# Patient Record
Sex: Female | Born: 1935 | Race: White | Hispanic: No | Marital: Married | Smoking: Former smoker
Health system: Southern US, Community
[De-identification: ages and names within clinical notes are randomized; demographics above are authoritative.]

## PROBLEM LIST (undated history)

## (undated) DIAGNOSIS — E871 Hypo-osmolality and hyponatremia: Secondary | ICD-10-CM

## (undated) DIAGNOSIS — R339 Retention of urine, unspecified: Secondary | ICD-10-CM

## (undated) DIAGNOSIS — D72829 Elevated white blood cell count, unspecified: Secondary | ICD-10-CM

## (undated) DIAGNOSIS — E222 Syndrome of inappropriate secretion of antidiuretic hormone: Secondary | ICD-10-CM

## (undated) DIAGNOSIS — C50919 Malignant neoplasm of unspecified site of unspecified female breast: Secondary | ICD-10-CM

## (undated) DIAGNOSIS — I10 Essential (primary) hypertension: Secondary | ICD-10-CM

## (undated) DIAGNOSIS — M1990 Unspecified osteoarthritis, unspecified site: Secondary | ICD-10-CM

## (undated) DIAGNOSIS — R5381 Other malaise: Secondary | ICD-10-CM

## (undated) DIAGNOSIS — Z9181 History of falling: Secondary | ICD-10-CM

## (undated) DIAGNOSIS — R4189 Other symptoms and signs involving cognitive functions and awareness: Secondary | ICD-10-CM

## (undated) DIAGNOSIS — I214 Non-ST elevation (NSTEMI) myocardial infarction: Secondary | ICD-10-CM

## (undated) DIAGNOSIS — Z86010 Personal history of colonic polyps: Secondary | ICD-10-CM

## (undated) DIAGNOSIS — Z9289 Personal history of other medical treatment: Secondary | ICD-10-CM

## (undated) DIAGNOSIS — E7800 Pure hypercholesterolemia, unspecified: Secondary | ICD-10-CM

## (undated) DIAGNOSIS — I5032 Chronic diastolic (congestive) heart failure: Secondary | ICD-10-CM

## (undated) HISTORY — DX: History of falling: Z91.81

## (undated) HISTORY — PX: BREAST LUMPECTOMY: SHX2

## (undated) HISTORY — DX: Non-ST elevation (NSTEMI) myocardial infarction: I21.4

## (undated) HISTORY — DX: Other symptoms and signs involving cognitive functions and awareness: R41.89

## (undated) HISTORY — DX: Unspecified osteoarthritis, unspecified site: M19.90

## (undated) HISTORY — DX: Chronic diastolic (congestive) heart failure: I50.32

## (undated) HISTORY — PX: COLONOSCOPY: SHX174

## (undated) HISTORY — DX: Personal history of other medical treatment: Z92.89

## (undated) HISTORY — DX: Elevated white blood cell count, unspecified: D72.829

## (undated) HISTORY — DX: Personal history of colonic polyps: Z86.010

## (undated) HISTORY — DX: Hypo-osmolality and hyponatremia: E87.1

## (undated) HISTORY — DX: Retention of urine, unspecified: R33.9

## (undated) HISTORY — DX: Syndrome of inappropriate secretion of antidiuretic hormone: E22.2

## (undated) HISTORY — DX: Other malaise: R53.81

## (undated) HISTORY — DX: Malignant neoplasm of unspecified site of unspecified female breast: C50.919

---

## 1998-03-03 ENCOUNTER — Encounter: Admission: RE | Admit: 1998-03-03 | Discharge: 1998-06-01 | Payer: Self-pay | Admitting: Radiation Oncology

## 1998-05-24 ENCOUNTER — Other Ambulatory Visit: Admission: RE | Admit: 1998-05-24 | Discharge: 1998-05-24 | Payer: Self-pay | Admitting: Obstetrics and Gynecology

## 1999-06-07 ENCOUNTER — Other Ambulatory Visit: Admission: RE | Admit: 1999-06-07 | Discharge: 1999-06-07 | Payer: Self-pay | Admitting: Obstetrics and Gynecology

## 2000-07-16 ENCOUNTER — Other Ambulatory Visit: Admission: RE | Admit: 2000-07-16 | Discharge: 2000-07-16 | Payer: Self-pay | Admitting: Obstetrics and Gynecology

## 2001-08-25 ENCOUNTER — Other Ambulatory Visit: Admission: RE | Admit: 2001-08-25 | Discharge: 2001-08-25 | Payer: Self-pay | Admitting: Obstetrics and Gynecology

## 2002-10-01 ENCOUNTER — Other Ambulatory Visit: Admission: RE | Admit: 2002-10-01 | Discharge: 2002-10-01 | Payer: Self-pay | Admitting: Obstetrics and Gynecology

## 2003-10-06 ENCOUNTER — Emergency Department (HOSPITAL_COMMUNITY): Admission: EM | Admit: 2003-10-06 | Discharge: 2003-10-06 | Payer: Self-pay | Admitting: Emergency Medicine

## 2003-10-06 ENCOUNTER — Other Ambulatory Visit: Admission: RE | Admit: 2003-10-06 | Discharge: 2003-10-06 | Payer: Self-pay | Admitting: Obstetrics and Gynecology

## 2004-12-25 ENCOUNTER — Other Ambulatory Visit: Admission: RE | Admit: 2004-12-25 | Discharge: 2004-12-25 | Payer: Self-pay | Admitting: Family Medicine

## 2004-12-29 ENCOUNTER — Encounter: Admission: RE | Admit: 2004-12-29 | Discharge: 2004-12-29 | Payer: Self-pay | Admitting: Family Medicine

## 2006-10-29 ENCOUNTER — Encounter: Admission: RE | Admit: 2006-10-29 | Discharge: 2006-10-29 | Payer: Self-pay | Admitting: Family Medicine

## 2006-11-18 ENCOUNTER — Ambulatory Visit: Payer: Self-pay | Admitting: Internal Medicine

## 2006-12-04 ENCOUNTER — Ambulatory Visit: Payer: Self-pay | Admitting: Internal Medicine

## 2007-01-31 ENCOUNTER — Other Ambulatory Visit: Admission: RE | Admit: 2007-01-31 | Discharge: 2007-01-31 | Payer: Self-pay | Admitting: Family Medicine

## 2007-02-03 ENCOUNTER — Encounter: Admission: RE | Admit: 2007-02-03 | Discharge: 2007-02-03 | Payer: Self-pay | Admitting: Family Medicine

## 2009-12-03 HISTORY — PX: CATARACT EXTRACTION W/ INTRAOCULAR LENS  IMPLANT, BILATERAL: SHX1307

## 2011-06-23 ENCOUNTER — Encounter: Payer: Self-pay | Admitting: *Deleted

## 2011-06-23 ENCOUNTER — Emergency Department (HOSPITAL_BASED_OUTPATIENT_CLINIC_OR_DEPARTMENT_OTHER)
Admission: EM | Admit: 2011-06-23 | Discharge: 2011-06-23 | Disposition: A | Discharge status: Nursing Home (Includes ICF) | Payer: Medicare Other | Source: Home / Self Care | Attending: Emergency Medicine | Admitting: Emergency Medicine

## 2011-06-23 ENCOUNTER — Emergency Department (INDEPENDENT_AMBULATORY_CARE_PROVIDER_SITE_OTHER): Payer: Medicare Other

## 2011-06-23 ENCOUNTER — Other Ambulatory Visit: Payer: Self-pay

## 2011-06-23 ENCOUNTER — Inpatient Hospital Stay (HOSPITAL_COMMUNITY)
Admission: AD | Admit: 2011-06-23 | Discharge: 2011-06-27 | DRG: 643 | Disposition: A | Discharge status: Home / Self Care | Payer: Medicare Other | Source: Other Acute Inpatient Hospital | Attending: Internal Medicine | Admitting: Internal Medicine

## 2011-06-23 DIAGNOSIS — R9431 Abnormal electrocardiogram [ECG] [EKG]: Secondary | ICD-10-CM

## 2011-06-23 DIAGNOSIS — I498 Other specified cardiac arrhythmias: Secondary | ICD-10-CM | POA: Diagnosis present

## 2011-06-23 DIAGNOSIS — Z87891 Personal history of nicotine dependence: Secondary | ICD-10-CM

## 2011-06-23 DIAGNOSIS — E236 Other disorders of pituitary gland: Principal | ICD-10-CM | POA: Diagnosis present

## 2011-06-23 DIAGNOSIS — I214 Non-ST elevation (NSTEMI) myocardial infarction: Secondary | ICD-10-CM | POA: Diagnosis present

## 2011-06-23 DIAGNOSIS — Z923 Personal history of irradiation: Secondary | ICD-10-CM

## 2011-06-23 DIAGNOSIS — R5381 Other malaise: Secondary | ICD-10-CM

## 2011-06-23 DIAGNOSIS — G319 Degenerative disease of nervous system, unspecified: Secondary | ICD-10-CM

## 2011-06-23 DIAGNOSIS — I679 Cerebrovascular disease, unspecified: Secondary | ICD-10-CM

## 2011-06-23 DIAGNOSIS — Z79899 Other long term (current) drug therapy: Secondary | ICD-10-CM

## 2011-06-23 DIAGNOSIS — R059 Cough, unspecified: Secondary | ICD-10-CM

## 2011-06-23 DIAGNOSIS — E785 Hyperlipidemia, unspecified: Secondary | ICD-10-CM | POA: Diagnosis present

## 2011-06-23 DIAGNOSIS — E871 Hypo-osmolality and hyponatremia: Secondary | ICD-10-CM

## 2011-06-23 DIAGNOSIS — I2489 Other forms of acute ischemic heart disease: Secondary | ICD-10-CM | POA: Diagnosis present

## 2011-06-23 DIAGNOSIS — R5383 Other fatigue: Secondary | ICD-10-CM

## 2011-06-23 DIAGNOSIS — I10 Essential (primary) hypertension: Secondary | ICD-10-CM | POA: Diagnosis present

## 2011-06-23 DIAGNOSIS — I248 Other forms of acute ischemic heart disease: Secondary | ICD-10-CM | POA: Diagnosis present

## 2011-06-23 DIAGNOSIS — N390 Urinary tract infection, site not specified: Secondary | ICD-10-CM | POA: Diagnosis present

## 2011-06-23 DIAGNOSIS — Z8249 Family history of ischemic heart disease and other diseases of the circulatory system: Secondary | ICD-10-CM

## 2011-06-23 DIAGNOSIS — R05 Cough: Secondary | ICD-10-CM

## 2011-06-23 DIAGNOSIS — E876 Hypokalemia: Secondary | ICD-10-CM | POA: Diagnosis present

## 2011-06-23 DIAGNOSIS — R112 Nausea with vomiting, unspecified: Secondary | ICD-10-CM

## 2011-06-23 DIAGNOSIS — Z823 Family history of stroke: Secondary | ICD-10-CM

## 2011-06-23 DIAGNOSIS — Z7982 Long term (current) use of aspirin: Secondary | ICD-10-CM

## 2011-06-23 DIAGNOSIS — Z853 Personal history of malignant neoplasm of breast: Secondary | ICD-10-CM

## 2011-06-23 HISTORY — DX: Pure hypercholesterolemia, unspecified: E78.00

## 2011-06-23 HISTORY — DX: Essential (primary) hypertension: I10

## 2011-06-23 LAB — URINALYSIS, ROUTINE W REFLEX MICROSCOPIC
Bilirubin Urine: NEGATIVE
Ketones, ur: 15 mg/dL — AB
Nitrite: NEGATIVE
Urobilinogen, UA: 0.2 mg/dL (ref 0.0–1.0)

## 2011-06-23 LAB — CBC
MCH: 30.8 pg (ref 26.0–34.0)
MCHC: 36.7 g/dL — ABNORMAL HIGH (ref 30.0–36.0)
MCV: 83.8 fL (ref 78.0–100.0)
Platelets: 215 10*3/uL (ref 150–400)
RBC: 4.52 MIL/uL (ref 3.87–5.11)

## 2011-06-23 LAB — COMPREHENSIVE METABOLIC PANEL
BUN: 11 mg/dL (ref 6–23)
Calcium: 9 mg/dL (ref 8.4–10.5)
GFR calc Af Amer: >60 mL/min (ref >60)
Glucose, Bld: 144 mg/dL — ABNORMAL HIGH (ref 70–99)
Sodium: 108 mEq/L — CL (ref 135–145)
Total Protein: 7.3 g/dL (ref 6.0–8.3)

## 2011-06-23 LAB — DIFFERENTIAL
Eosinophils Absolute: 0 10*3/uL (ref 0.0–0.7)
Eosinophils Relative: 0 % (ref 0–5)
Lymphs Abs: 1.2 10*3/uL (ref 0.7–4.0)
Monocytes Relative: 9 % (ref 3–12)

## 2011-06-23 LAB — AMMONIA: Ammonia: 54 umol/L (ref 11–60)

## 2011-06-23 LAB — URINE MICROSCOPIC-ADD ON

## 2011-06-23 LAB — CK TOTAL AND CKMB (NOT AT ARMC): Relative Index: 3.7 — ABNORMAL HIGH (ref 0.0–2.5)

## 2011-06-23 LAB — APTT: aPTT: 29 seconds (ref 24–37)

## 2011-06-23 LAB — PROTIME-INR: INR: 1.06 (ref 0.00–1.49)

## 2011-06-23 MED ORDER — SODIUM CHLORIDE 0.9 % IV SOLN: Freq: Once | INTRAVENOUS | Status: AC

## 2011-06-23 MED ORDER — ASPIRIN 81 MG PO CHEW
324.0000 mg | CHEWABLE_TABLET | Freq: Once | ORAL | Status: AC
  Administered 2011-06-23: 324 mg via ORAL
  Filled 2011-06-23: qty 4

## 2011-06-23 NOTE — ED Notes (Signed)
Dr Hyacinth Meeker notified regarding lab results

## 2011-06-23 NOTE — ED Notes (Signed)
Cough since end of June. Saw Dr. Thursday. Placed on decongestant and new BP med. Now c/o N/V, weakness and shakes

## 2011-06-23 NOTE — ED Provider Notes (Signed)
History     Chief Complaint  Patient presents with  . Cough    Cough since end of June. Saw Dr. Thursday. Placed on decongestant and new BP med. Now c/o N/V, weakness and shakes   HPI Comments: Pt has had 3 weeks of a cough which is dry and non productive - took delsym on Wednesday and has not relieved it - started on new antihypertensive Losartan in addition to BB on Thursday and noted to have severe n/v that night, improved last night and gone today.  Concomittant with the Losartan, pt developed generalized weakness and tremor which has been fairly constant, nothing makes better or worse but is not associated with fevers, chills, abd pain, diarrhea, dysuria, fever, stiff neck, back pain, cp, palpitations.  She has no sore throat, congestion or change in vision and she denies near syncope or vertigo.  N/v has resolved though appetite is poor today.  Saw her PMD Dr. Maryelizabeth Rowan on thursdcay.  Patient is a 77 y.o. female presenting with cough. The history is provided by the patient and the spouse.  Cough Pertinent negatives include no chest pain, no chills, no headaches, no sore throat and no shortness of breath.    Past Medical History  Diagnosis Date  . Hypertension   . Hypercholesteremia   . Cancer     Past Surgical History  Procedure Date  . Breast lumpectomy     History reviewed. No pertinent family history.  History  Substance Use Topics  . Smoking status: Never Smoker   . Smokeless tobacco: Not on file  . Alcohol Use: Yes    OB History    Grav Para Term Preterm Abortions TAB SAB Ect Mult Living                  Review of Systems  Constitutional: Negative for fever and chills.  HENT: Negative for sore throat and neck pain.   Eyes: Negative for visual disturbance.  Respiratory: Positive for cough. Negative for shortness of breath.   Cardiovascular: Negative for chest pain.  Gastrointestinal: Negative for nausea, vomiting, abdominal pain and diarrhea.    Genitourinary: Negative for dysuria and frequency.  Musculoskeletal: Negative for back pain.  Skin: Negative for rash.  Neurological: Negative for weakness, numbness and headaches.  Hematological: Negative for adenopathy.  Psychiatric/Behavioral: Negative for behavioral problems.    Physical Exam  BP 166/81  Pulse 80  Temp(Src) 98 F (36.7 C) (Oral)  Resp 20  Ht 5\' 3"  (1.6 m)  Wt 137 lb (62.143 kg)  BMI 24.27 kg/m2  SpO2 100%  Physical Exam  Nursing note and vitals reviewed. Constitutional: She is oriented to person, place, and time. She appears well-developed and well-nourished. No distress.  HENT:  Head: Normocephalic and atraumatic.  Mouth/Throat: Oropharynx is clear and moist. No oropharyngeal exudate.  Eyes: Conjunctivae and EOM are normal. Pupils are equal, round, and reactive to light. Right eye exhibits no discharge. Left eye exhibits no discharge. No scleral icterus.  Neck: Normal range of motion. Neck supple. No JVD present. No thyromegaly present.  Cardiovascular: Normal rate, regular rhythm, normal heart sounds and intact distal pulses.  Exam reveals no gallop and no friction rub.   No murmur heard. Pulmonary/Chest: Effort normal and breath sounds normal. No respiratory distress. She has no wheezes. She has no rales.  Abdominal: Soft. Bowel sounds are normal. She exhibits no distension and no mass. There is no tenderness.  Musculoskeletal: Normal range of motion. She exhibits no edema  and no tenderness.  Lymphadenopathy:    She has no cervical adenopathy.  Neurological: She is alert and oriented to person, place, and time. She has normal strength and normal reflexes. She displays tremor. She displays no atrophy. No cranial nerve deficit or sensory deficit. She exhibits normal muscle tone. She displays a negative Romberg sign. She displays no seizure activity. Gait ( has unsteady shaky gait) abnormal. Coordination normal. GCS eye subscore is 4. GCS verbal subscore is 5.  GCS motor subscore is 6.  Reflex Scores:      Patellar reflexes are 2+ on the right side and 2+ on the left side.      Cannot heel toe but has normal fnf and heel shin bil with only mild tremor on fnf bil.  EOM normal, reflexes at knees normal, sensation to light touch and pain bil UE and LE's normal, speech normal.  Skin: Skin is warm and dry. No rash noted. She is not diaphoretic. No erythema.  Psychiatric: She has a normal mood and affect. Her behavior is normal.    ED Course  CRITICAL CARE Performed by: Eber Hong D Authorized by: Eber Hong D Total critical care time: 35 minutes Critical care time was exclusive of separately billable procedures and treating other patients and teaching time. Critical care was necessary to treat or prevent imminent or life-threatening deterioration of the following conditions: metabolic crisis (Hyponatremia and abnormal cardiac function). Critical care was time spent personally by me on the following activities: evaluation of patient's response to treatment, ordering and performing treatments and interventions, pulse oximetry, examination of patient, ordering and review of laboratory studies, re-evaluation of patient's condition, ordering and review of radiographic studies and obtaining history from patient or surrogate. Comments: Normal saline given for hyponatremia, aspirin 4 elevated troponin.    MDM Has ongling tremor and sob with cough with normal sat's, pulse of 80 and neuro exam with some abnromalities.  Needs CT, labs and CXR.  Possible reaction to new medicine.  Troponin noted to be mildly elevated, sodium to be severely depressed at 108. Normal saline ordered as well as aspirin. CT head chest x-ray pending.  ED ECG REPORT   Date: 06/23/2011   Rate: 72  Rhythm: normal sinus rhythm  QRS Axis: normal  Intervals: normal  ST/T Wave abnormalities: ST depressions inferiorly and ST depressions anteriorly  Conduction Disutrbances:none   Narrative Interpretation:   Old EKG Reviewed: changes noted new ST depression  I have discussed the care with Dr. Tresa Endo of the internal medicine service on [facility]. He has accepted care to a step down bed at [facility]. Will await critical care transport for this patient. Blood pressure has improved and pulse remained stable.   Vida Roller, MD 06/23/11 [phone]

## 2011-06-24 ENCOUNTER — Inpatient Hospital Stay (HOSPITAL_COMMUNITY): Payer: Medicare Other

## 2011-06-24 DIAGNOSIS — I517 Cardiomegaly: Secondary | ICD-10-CM

## 2011-06-24 DIAGNOSIS — R7989 Other specified abnormal findings of blood chemistry: Secondary | ICD-10-CM

## 2011-06-24 DIAGNOSIS — R9431 Abnormal electrocardiogram [ECG] [EKG]: Secondary | ICD-10-CM

## 2011-06-24 LAB — OSMOLALITY: Osmolality: 221 mOsm/kg — ABNORMAL LOW (ref 275–300)

## 2011-06-24 LAB — SODIUM
Sodium: 147 mEq/L — ABNORMAL HIGH (ref 135–145)
Sodium: 121 mEq/L — ABNORMAL LOW (ref 135–145)

## 2011-06-24 LAB — CARDIAC PANEL(CRET KIN+CKTOT+MB+TROPI)
Relative Index: 3.6 — ABNORMAL HIGH (ref 0.0–2.5)
Relative Index: 3.7 — ABNORMAL HIGH (ref 0.0–2.5)
Total CK: 266 U/L — ABNORMAL HIGH (ref 7–177)
Troponin I: <0.3 ng/mL (ref <0.30)

## 2011-06-24 LAB — BASIC METABOLIC PANEL
BUN: 8 mg/dL (ref 6–23)
CO2: 22 mEq/L (ref 19–32)
CO2: 24 mEq/L (ref 19–32)
Calcium: 8.3 mg/dL — ABNORMAL LOW (ref 8.4–10.5)
Calcium: 8 mg/dL — ABNORMAL LOW (ref 8.4–10.5)
Chloride: 75 mEq/L — ABNORMAL LOW (ref 96–112)
Chloride: 75 mEq/L — ABNORMAL LOW (ref 96–112)
Creatinine, Ser: <0.47 mg/dL — ABNORMAL LOW (ref 0.50–1.10)
Creatinine, Ser: <0.47 mg/dL — ABNORMAL LOW (ref 0.50–1.10)
Glucose, Bld: 126 mg/dL — ABNORMAL HIGH (ref 70–99)
Glucose, Bld: 124 mg/dL — ABNORMAL HIGH (ref 70–99)
Glucose, Bld: 104 mg/dL — ABNORMAL HIGH (ref 70–99)

## 2011-06-24 LAB — TSH: TSH: 1.171 u[IU]/mL (ref 0.350–4.500)

## 2011-06-24 LAB — NA AND K (SODIUM & POTASSIUM), RAND UR
Potassium Urine: 36 mEq/L
Sodium, Ur: 41 mEq/L

## 2011-06-24 LAB — MAGNESIUM: Magnesium: 1.8 mg/dL (ref 1.5–2.5)

## 2011-06-24 LAB — OSMOLALITY, URINE: Osmolality, Ur: 337 mOsm/kg — ABNORMAL LOW (ref 390–1090)

## 2011-06-24 LAB — MRSA PCR SCREENING: MRSA by PCR: NEGATIVE

## 2011-06-25 DIAGNOSIS — I214 Non-ST elevation (NSTEMI) myocardial infarction: Secondary | ICD-10-CM

## 2011-06-25 LAB — COMPREHENSIVE METABOLIC PANEL
ALT: 13 U/L (ref 0–35)
CO2: 22 mEq/L (ref 19–32)
Calcium: 8.4 mg/dL (ref 8.4–10.5)
Chloride: 94 mEq/L — ABNORMAL LOW (ref 96–112)
GFR calc Af Amer: >60 mL/min (ref >60)
GFR calc non Af Amer: >60 mL/min (ref >60)
Glucose, Bld: 98 mg/dL (ref 70–99)
Sodium: 125 mEq/L — ABNORMAL LOW (ref 135–145)
Total Bilirubin: 0.5 mg/dL (ref 0.3–1.2)

## 2011-06-25 LAB — URINE CULTURE

## 2011-06-25 LAB — CARDIAC PANEL(CRET KIN+CKTOT+MB+TROPI)
CK, MB: 6.2 ng/mL (ref 0.3–4.0)
Total CK: 170 U/L (ref 7–177)

## 2011-06-25 LAB — CORTISOL: Cortisol, Plasma: 22.6 ug/dL

## 2011-06-26 ENCOUNTER — Inpatient Hospital Stay (HOSPITAL_COMMUNITY): Payer: Medicare Other

## 2011-06-26 DIAGNOSIS — R079 Chest pain, unspecified: Secondary | ICD-10-CM

## 2011-06-26 LAB — CBC
Hemoglobin: 14 g/dL (ref 12.0–15.0)
MCH: 31.3 pg (ref 26.0–34.0)
Platelets: 241 10*3/uL (ref 150–400)
RBC: 4.47 MIL/uL (ref 3.87–5.11)
WBC: 9.6 10*3/uL (ref 4.0–10.5)

## 2011-06-26 LAB — BASIC METABOLIC PANEL
CO2: 24 mEq/L (ref 19–32)
Chloride: 98 mEq/L (ref 96–112)
Glucose, Bld: 97 mg/dL (ref 70–99)
Potassium: 3.7 mEq/L (ref 3.5–5.1)
Sodium: 132 mEq/L — ABNORMAL LOW (ref 135–145)

## 2011-06-26 MED ORDER — TECHNETIUM TC 99M TETROFOSMIN IV KIT
10.0000 | PACK | Freq: Once | INTRAVENOUS | Status: AC | PRN
  Administered 2011-06-26: 10 via INTRAVENOUS

## 2011-06-26 MED ORDER — TECHNETIUM TC 99M TETROFOSMIN IV KIT
30.0000 | PACK | Freq: Once | INTRAVENOUS | Status: AC | PRN
  Administered 2011-06-26: 30 via INTRAVENOUS

## 2011-06-27 ENCOUNTER — Other Ambulatory Visit (HOSPITAL_COMMUNITY): Payer: BC Managed Care – PPO

## 2011-06-27 LAB — CBC
HCT: 37.3 % (ref 36.0–46.0)
Hemoglobin: 13.3 g/dL (ref 12.0–15.0)
MCH: 30.9 pg (ref 26.0–34.0)
MCHC: 35.7 g/dL (ref 30.0–36.0)
MCV: 86.7 fL (ref 78.0–100.0)

## 2011-06-27 LAB — BASIC METABOLIC PANEL
BUN: 12 mg/dL (ref 6–23)
Calcium: 8.8 mg/dL (ref 8.4–10.5)
Creatinine, Ser: 0.67 mg/dL (ref 0.50–1.10)
GFR calc non Af Amer: >60 mL/min (ref >60)
Glucose, Bld: 100 mg/dL — ABNORMAL HIGH (ref 70–99)

## 2011-06-29 NOTE — Consult Note (Addendum)
NAME:  Carla Robles, Carla Robles NO.:  [id number]  MEDICAL RECORD NO.:  [id number]  LOCATION:  2908                         FACILITY:  [facility]  PHYSICIAN:  Marca Ancona, MD      DATE OF BIRTH:  October 16, 1934  DATE OF CONSULTATION: DATE OF DISCHARGE:  06/24/2011                                CONSULTATION   PRIMARY CARDIOLOGIST:  New to [facility], currently being seen by Dr. Shirlee Latch.  PRIMARY MEDICAL DOCTOR:  Maryelizabeth Rowan, MD  CHIEF COMPLAINT:  Difficulty urinating, nausea, vomiting.  REASON FOR CONSULTATION:  Abnormal EKG and positive cardiac enzymes in the setting of hyponatremia with sodium level of 108.  HISTORY OF PRESENT ILLNESS:  Carla Robles is a 77 year old pleasant female with a history of hypertension, hyperlipidemia, and breast cancer remotely with no prior history of cardiac workup.  For the past 3 weeks, she has got a cold and cough prior to admission.  In the last week, she saw her primary care provider and was prescribed losartan for elevated blood pressure.  She only took it for 2 or 3 days because she developed nausea and vomiting for approximately 2 days along with diarrhea.  The patient is not entirely clear on events in her history and reports mental fogginess.  She actually prompted with information obtained from the chart to be able to remember these happening.  She also reported to the initial physician urinary frequency, hesitancy, and pelvic fullness. UA demonstrated many bacteria, large leukocytes, and large blood.  She was subsequently been started on Rocephin.  Of most significant is the low sodium level obtained at 108, which has since been repeated and confirmed.  Her potassium and chloride are also low.  She denies any history of problems with her sodium.  We have been called regarding positive cardiac enzymes, which were drawn as a part of her initial workup for her complaint of the nausea and vomiting several days back.   Troponins have trended 0.33, then 0.39, then 0.30 with a peak MB of 10.1.  EKG demonstrates diffuse ST depression in V4 through V6, II, and aVF with no prior to compare to. She was started on Lovenox and aspirin by the primary team.  She denies any chest pain, either now or in the past.  She walks on a fairly regular basis and does endorse some shortness of breath.  She denies any palpitations or lower extremity edema.  She is unsure if she has had any orthopnea, but does state that since having the cold, she prefers the head of the bed up.  Two-D echocardiogram is pending.  PAST MEDICAL HISTORY: 1. Hypertension. 2. Hyperlipidemia. 3. Breast cancer, status post left lumpectomy and radiation     approximately 14 years ago.  SURGICAL HISTORY:  Include: 1. Lumpectomy. 2. Tonsillectomy.  MEDICATIONS:  Outpatient: 1. Calcium carbonate. 2. Vitamin B. 3. WelChol. 4. Multivitamin. 5. Fish oil. 6. Losartan 50 mg daily. 7. Toprol-XL 50 mg daily.  Inpatient: 1. Aspirin 325 mg daily. 2. Rocephin. 3. Lovenox 60 mg b.i.d. 4. Potassium chloride 40 mEq x1.  ALLERGIES:  STATIN MEDICATION cause loss of memory.  SOCIAL HISTORY:  The patient  lives in [place] with her husband.  They have 3 sons.  She is a retired Adult nurse.  She smoked for approximately 15 years and quit in 1969, having smoked less than a pack per day.  She denies any alcohol use.  FAMILY HISTORY:  Positive for hypertension and CVA in her mother, diabetes and dementia in her father.  She has one sister, who is in good health.  REVIEW OF SYSTEMS:  No fevers, chills, sweats, changes in her weight, chest pain, shortness of breath.  She does get occasional dyspnea on exertion.  No syncope, nausea, vomiting, diarrhea currently.  All other systems were reviewed and are otherwise negative.  LABORATORY DATA:  WBC 13.6, hemoglobin 13.9, hematocrit 37.9, platelet count 215.  Serum osmolality 221.  Sodium 108,  potassium 3, chloride 75, CO2 of 24, glucose 124, BUN 9, creatinine 0.47.  Total bilirubin 1.3, otherwise LFTs are normal.  Troponin 0.33 then 0.39 with MB 10.1 and 0.95.  CKs 271 and 266.  Ammonia 54.  UA showed many bacteria, few squamous epithelial cells, large leukocytes, large blood, and 100 bilirubin.  EKG:  Normal sinus rhythm, LVH with ST depression in V4 through V6, II, and aVF with ST elevation noted in AV, RV, I and V2 with no prior comparison.  RADIOLOGIC STUDIES: 1. Chest x-ray showed opacities of the lower lobes bilaterally,     question nipple shadow.  Recommend repeat scan with nipple markers.     No pneumonia. 2. CT of the head showed mild atrophy of small vessel disease.  No     acute findings.  PHYSICAL EXAMINATION:  VITAL SIGNS:  Temperature 97.8, pulse 66, respirations 14, blood pressure 126/70, pulse ox 98% on room air. GENERAL:  This is a pleasant white female in no acute distress. HEENT:  Normocephalic and atraumatic with extraocular movements intact. Clear sclerae.  Nares are without discharge. NECK:  Supple without carotid bruits or JVD. HEART:  Auscultation of the heart reveals regular rate and rhythm withS1 and S2 without murmurs, rubs, or gallops. LUNGS:  Clear to auscultation bilaterally without wheezes, rales, or rhonchi. ABDOMEN:  Soft, nontender, nondistended with positive bowel sounds. EXTREMITIES:  Warm, dry, and without edema.  She has 2+ pedal pulses bilaterally.  NEUROLOGIC:  She is alert and oriented x3, but sometimes has difficulty locating what she wants to say.  She had to be reminded of information that was found on the chart to remember the story of why she came to the hospital.  She moves all extremities spontaneously without difficulty.  ASSESSMENT AND PLAN:  The patient was seen and examined by Dr. Shirlee Latch and myself.  This is a 77 year old female with a history of hypertension, hyperlipidemia, and breast cancer who presents  with profound hyponatremia with an elevated troponin, abnormal EKG.  She denies any chest pain.  She had nausea and vomiting for 2 days with ongoing p.o. fluid intake. 1. Hyponatremia.  Workup is currently underway by the Hospitalist     Service including urine sodium, potassium, and osmolality.  The     etiology is unclear.  The patient actually does not appear severely     dehydrated as her BUN and creatinine are normal.  Despite some     nausea and vomiting 2 days ago, she was keeping up with loss of     H2O.  There was a concern for underlying syndrome of inappropriate     antidiuretic hormone.  We will await the sodium studies.  Treatment  is underway by the Hospitalist Service with 3% saline.  We will     defer to Primary Service for continued management.  Would recommend     to follow sodium carefully with treatment, no more than an increase     in 10 per day. 2. Elevated troponin.  The patient denies any chest pain, but has     significant inferolateral ST depression as well as elevated CK-MB     and troponin.  Differential includes acute coronary syndrome/plaque     rupture versus demand ischemia.  We see no definite cause for     demand ischemia.  She has had no hypotension, tachycardia etc.     Continue aspirin and Lovenox, we will add statin and beta-blocker.     We would lean towards catheterization once sodium is back to a safe     range.  We will also check a 2-D echocardiogram.  Thank you for the opportunity to participate in the care of this patient.  Given her abnormal chest x-ray on the initial workup, we will also repeat this as recommended by the radiologist.     Ronie Spies, P.A.C.   ______________________________ Marca Ancona, MD    DD/MEDQ  D:  06/24/2011  T:  06/24/2011  Job:  [id number]  cc:   Maryelizabeth Rowan, M.D.  Electronically Signed by Ronie Spies  on 06/29/2011 06:06:52 PM Electronically Signed by Marca Ancona MD on 08/18/2011 10:14:04 PM

## 2011-07-03 NOTE — H&P (Signed)
NAME:  Carla Robles, Carla Robles NO.:  [id number]  MEDICAL RECORD NO.:  [id number]  LOCATION:  2915                         FACILITY:  [facility]  PHYSICIAN:  Candelaria Celeste, DO      DATE OF BIRTH:  12-07-1933  DATE OF ADMISSION:  06/23/2011 DATE OF DISCHARGE:                             HISTORY & PHYSICAL   PRIMARY CARE PROVIDER:  Maryelizabeth Rowan, MD  CHIEF COMPLAINT:  Difficulty urinating.  HISTORY OF PRESENT ILLNESS:  Ms. Bhargava is a 77 year old female with a history of hypertension, recurrent hyperlipidemia, and history of breast cancer status post lumpectomy and radiation 14 years ago who had recent addition of losartan for blood pressure regimen approximately 6 days ago, who had 2 days of vomiting and diarrhea 3 days ago.  She began having frequency and hesitancy and pelvic pressure that started this morning.  She went to [facility] for evaluation and treatment.  At that medical center, she was found to have a sodium of 108 and troponin I of 0.33.  She also had new ST depression in the inferior and lateral leads.  She was subsequently transferred to [facility] for further workup and evaluation.  She denies having fevers, chills, chest pain, shortness of breath, diaphoresis, fatigue, confusion, weakness, nausea, vomiting, or diarrhea currently. She denies having chest pain at any time over the past several weeks.  PAST MEDICAL HISTORY: 1. Hypertension. 2. Hyperlipidemia. 3. Breast cancer, status post lumpectomy and radiation approximately     14 years ago.  PAST SURGICAL HISTORY:  The patient had a lumpectomy as stated above 14 years ago.  She denies other surgeries.  MEDICATIONS: 1. Calcium carbonate with vitamin D 600 mg/400 international units     daily. 2. Vitamin D3 300 international units daily. 3. WelChol 3.7 g p.o. daily. 4. Multivitamin. 5. Fish oil, Omega-3 fatty acids 1000 mg daily. 6. Losartan 50 mg daily. 7.  Metoprolol XL 50 mg every 24 hours.  ALLERGIES:  The patient describes side effects with STATIN medications.  FAMILY HISTORY:  The patient describes family history of hypertension in her mother and diabetes and stroke in her father.  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  She has a remote history of smoking over 3-4 years ago.  She denies alcohol or illicit drug use.  PHYSICAL EXAMINATION:  VITAL SIGNS:  The patient is afebrile.  Heart rate 67, blood pressure 147/76, and oxygen saturation 98% on room air. GENERAL:  This is an elderly Caucasian female who is awake, alert, and oriented x3 in no acute distress. HEENT:  Head is normocephalic and atraumatic.  Pupils are equal, round, and reactive to light.  Extraocular muscles are intact.  Sclerae are anicteric and noninjected.  Posterior oropharynx is nonerythematous and mucosal membranes are moist. NECK:  Supple without lymphadenopathy or jugular venous distention. LUNGS:  Clear to auscultation bilaterally with no wheezes, rales, or rhonchi. CARDIAC:  Regular rate with normal S1 and S2 sounds with no murmurs auscultated. ABDOMEN:  Soft.  Mild tenderness to the suprapubic area with no rebound, tenderness, or guarding present.  There  is no hepatosplenomegaly. NEURO:  Strength is 5/5 in the upper and lower extremities bilaterally with no focal neurological deficits.  There is no edema present. VASCULAR:  Extremities are warm to touch with 2+ dorsalis pedis and radial pulses.  LABORATORY DATA: 1. A complete metabolic panel was performed which showed a sodium of     108, a potassium of 3.6, chloride of 72, bicarb of 22, BUN of 11,     creatinine of 0.5, and a glucose of 144. 2. The patient's liver function tests showed a bili of 1.3, alk phos     of 79, AST of 34, and ALT of 14. 3. A CBC was drawn which showed a white count of 13.6, hemoglobin of     13.9, hematocrit of 37.9, and  platelets of 215. 4. A cardiac panel showed a CK of 271 and CK-MB of 10.1 and a troponin     I of 0.33. 5. Urinalysis showed a specific gravity 1.012, ketones of 15, large     blood, negative nitrites, and large leukocyte esterase with a     microscopic exam of 21-50 white blood cells per high-powered field     and many bacteria.  All of the above laboratory data from outside     hospital. 6. Ammonia was 54 and INR was 1.06. 7. EKG from outside hospital showed new ST depressions in II, III,     aVF, and in lateral leads. 8. A chest x-ray was without evidence of infiltrate, effusion, or     pneumothorax.  IMPRESSION: 1. Hyponatremia. 2. Elevated troponins with question of non-ST-elevation myocardial     infarction. 3. Urinary tract infection. 4. Hyperlipidemia. 5. Hypertension.  PLAN:  We will admit the patient to Step-Down Unit and recheck the patient's basic metabolic panel.  The patient's current physical exam and presentation does not convince me that the sodium level of 108 is realized that this degree of hyponatremia should cause extreme fatigue, weakness, and somnolence as well as seizures.  We will treat the patientappropriately depending on the new sodium level that we receive from the current lab draw.  In regard to the patient's elevated troponins, we will recheck the patient's cardiac panel and start the patient on treatment for the possibility of NSTENI of enoxaparin 1 mg/kg every 12 hours.  We will also obtain a new EKG to evaluate for changes.  For the patient's urinary tract infection, we will assure that the patient's urine will be cultured and start the patient on ceftriaxone 1 g IV every 24 hours while waiting for sensitivities as the medical records that we received from the outside hospital did not document the administration of IV antibiotics.  We will continue the patient on her home meds.  The patient is a full code.  DVT prophylaxis should be covered by  the patient's enoxaparin and treatment for NSTEMI.          ______________________________ Candelaria Celeste, DO     JS/MEDQ  D:  06/24/2011  T:  06/24/2011  Job:  [id number]  Electronically Signed by Candelaria Celeste DO on 07/03/2011 10:07:57 PM

## 2011-07-12 ENCOUNTER — Encounter: Payer: Self-pay | Admitting: Physician Assistant

## 2011-07-13 ENCOUNTER — Encounter: Payer: Self-pay | Admitting: Physician Assistant

## 2011-07-13 ENCOUNTER — Ambulatory Visit (INDEPENDENT_AMBULATORY_CARE_PROVIDER_SITE_OTHER): Payer: Medicare Other | Admitting: Physician Assistant

## 2011-07-13 VITALS — BP 145/81 | HR 69 | Ht 63.0 in | Wt 131.0 lb

## 2011-07-13 DIAGNOSIS — E871 Hypo-osmolality and hyponatremia: Secondary | ICD-10-CM | POA: Insufficient documentation

## 2011-07-13 DIAGNOSIS — E785 Hyperlipidemia, unspecified: Secondary | ICD-10-CM

## 2011-07-13 DIAGNOSIS — I214 Non-ST elevation (NSTEMI) myocardial infarction: Secondary | ICD-10-CM

## 2011-07-13 DIAGNOSIS — I10 Essential (primary) hypertension: Secondary | ICD-10-CM | POA: Insufficient documentation

## 2011-07-13 NOTE — Assessment & Plan Note (Signed)
Doing well.  She can continue ASA, statin and beta blocker.  She likely had a Type 2 NSTEMI in the setting of profound hyponatremia and severe illness.  Echo and myoview were both normal.  No further recommendations at this time.  Will have her follow up with Dr. Shirlee Latch in 3 months.  She will continue to increase activity on her own.

## 2011-07-13 NOTE — Assessment & Plan Note (Signed)
Now on pravastatin.  She can have cholesterol followed by her PCP.

## 2011-07-13 NOTE — Progress Notes (Signed)
History of Present Illness: Primary Cardiologist:  Dr. Marca Ancona  Carla Robles is a 77 y.o. female who presents for post hospital follow up.  She has a h/o HTN and hyperlipidemia and breast cancer.  She was admitted 7/21-7/25 with profound hyponatremia (Na 108) in the setting of N/V and diarrhea.  She had also started on an ARB prior to this.  It was felt her low Na was multifactorial as well as related to SIADH.  She was treated with hypertonic saline with eventual recovery of her Na.  She had cardiac markers drawn in her evaluation and these were abnormal.  Peak troponin was 0.39.  She never had chest pain and therefore a Myoview was done.  This was on 7/24 and demonstrated EF 85% and no ischemia.  Echo 7/22:  EF 60-65%, normal wall motion, mild LVH, grade 1 diast dysfxn, PASP 29.  It was thought she had a Type 2 NSTEMI 2/2 demand ischemia in setting of illness and profound hyponatremia.    The patient denies chest pain, shortness of breath, syncope, orthopnea, PND or significant pedal edema.  No palpitations.  She feels fatigued.  She has seen her PCP since discharge and her recent Na was 137.    Past Medical History  Diagnosis Date  . Hypertension   . Hypercholesteremia   . Breast cancer   . NSTEMI (non-ST elevated myocardial infarction)     in setting of low Na:  Echo 7/22:  EF 60-65%, normal wall motion, mild LVH, grade 1 diast dysfxn, PASP 29.;  Myoview:  EF 85% and no ischemia  . SIADH (syndrome of inappropriate ADH production)     Current Outpatient Prescriptions  Medication Sig Dispense Refill  . aspirin 81 MG tablet Take 81 mg by mouth daily.        . calcium carbonate (OS-CAL - DOSED IN MG OF ELEMENTAL CALCIUM) 1250 MG tablet Take 1 tablet by mouth daily.        . Calcium Carbonate-Vitamin D (CALTRATE 600+D) 600-400 MG-UNIT per chew tablet Chew 1 tablet by mouth daily.        . Cholecalciferol (VITAMIN D3) 3000 UNITS TABS Take 1 tablet by mouth daily.        . fish oil-omega-3  fatty acids 1000 MG capsule Take 1 g by mouth daily.        . metoprolol (TOPROL-XL) 50 MG 24 hr tablet Take 50 mg by mouth daily.        . Multiple Vitamin (MULTIVITAMIN) tablet Take 1 tablet by mouth daily.        . pravastatin (PRAVACHOL) 20 MG tablet Take 20 mg by mouth daily.          Allergies: Allergies  Allergen Reactions  . Statins Other (See Comments)    unknown    Vital Signs: BP 145/81  Pulse 69  Ht 5\' 3"  (1.6 m)  Wt 131 lb (59.421 kg)  BMI 23.21 kg/m2  PHYSICAL EXAM: Well nourished, well developed, in no acute distress HEENT: normal Neck: no JVD Vascular: no carotid bruits. Cardiac:  normal S1, S2; RRR; no murmur Lungs:  clear to auscultation bilaterally, no wheezing, rhonchi or rales Abd: soft, nontender, no hepatomegaly Ext: no edema Skin: warm and dry Neuro:  CNs 2-12 intact, no focal abnormalities noted  EKG:  NSR, HR 69, LAD, NSSTTW changes, no significant change when compared to prior tracings.  ASSESSMENT AND PLAN:

## 2011-07-13 NOTE — Assessment & Plan Note (Signed)
Somewhat elevated.  She can continue current meds and follow up with her PCP.

## 2011-07-13 NOTE — Patient Instructions (Signed)
Your physician recommends that you schedule a follow-up appointment in: 09/06/11 @ 9:45 with Dr. Shirlee Latch  NO CHANGES AT THIS TIME

## 2011-07-13 NOTE — Assessment & Plan Note (Signed)
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  She still feels fatigued.  I have asked her to increase her activity.  She would like to see if her fluid restriction (1000 cc/day) can be changed.  I have recommended she continue 1L per day and follow up with her PCP.  She would likely need frequent lab draws to make sure her Na remained stable if she increased her fluid intake.  She brought her recent lab values done with her PCP in for my review and her recent Na was 137.  She had many questions today and I was able to answer them all for her.

## 2011-07-16 NOTE — Consult Note (Signed)
NAME:  Carla Robles, FAW NO.:  [id number]  MEDICAL RECORD NO.:  [id number]  LOCATION:  2908                         FACILITY:  [facility]  PHYSICIAN:  Maree Krabbe, M.D.DATE OF BIRTH:  [birth year]  DATE OF CONSULTATION: DATE OF DISCHARGE:                                CONSULTATION   PRIMARY CARE PHYSICIAN:  Maryelizabeth Rowan, MD  CHIEF COMPLAINT:  Difficulty urinating, nausea, and vomiting.  HISTORY OF PRESENT ILLNESS:  This is a 77 year old female with past medical history of hypertension, hyperlipidemia, and breast cancer status post lumpectomy in 1999 who presents with difficulty urinating, nausea, vomiting, diarrhea x1 day and found to be hyponatremic.  The patient was seen at her PCPs office several days ago complaining of persistent cough.  She was started on a new blood pressure med losartan 4-5 days ago.  She developed nausea, vomiting, and diarrhea 2 days after taking the new medication.  This morning the patient presented [facility] with complaints of urinary frequency and urgency. She was found to have a sodium of 108.  The patient was admitted to the hospital.  Repeat sodium was 107.  Renal was consulted to help co-manage the patient's hyponatremia.  UA revealed 100 protein, large blood and large leukocytes, no casts.  The patient is currently on Rocephin IV for a presumed UTI.  Per family, the patient has been more confused than baseline and has been unsteady on her feet.  ALLERGIES:  STATIN.  PAST MEDICAL HISTORY:  Hypertension, hyperlipidemia, breast cancer status post lumpectomy with radiation over 14 years ago.  PAST SURGICAL HISTORY: 1. Lumpectomy. 2. Tonsillectomy.  SOCIAL HISTORY:  Lives with her husband in [place].  She is a retired Adult nurse.  She graduated from college and went on to get a masters in physical therapy.  She is a former smoker.  She quit over 30 years ago.  She drinks occasionally wine on  weekends.  FAMILY HISTORY:  Mother had hypertension.  Father had diabetes and dementia.  She has 1 sibling.  PAST MEDICAL HISTORY:  Noncontributory.  No family history of cancer.  MEDICATIONS:  Home medications: 1. Vitamin D3 11 tablets daily. 2. WelChol 3.75 grams 1 pack daily. 3. Fish oil. 4. Calcium carbonate. 5. Toprol-XL 50 mg 1 tablet daily. 6. Losartan 50 mg 1 tablet daily. 7. Multivitamin.  REVIEW OF SYSTEMS:  Endorses fatigue, cough.  Unsteady gait and confusion.  Denies fever, chills, change in appetite.  Denies headache, chest pain, abdominal pain.  Denies shortness of breath.  Denies pain with urination.  PHYSICAL EXAMINATION:  VITAL SIGNS:  Temperature 97.6, pulse 65, respirations 13-16, blood pressure 139/69, pulse ox 97% on room air, weight 61.8 kg. GENERAL:  She is awake, alert, oriented x3, cooperative in no acute distress. HEENT:  NCAT. NECK:  Supple.  No masses, no JVD. CARDIOVASCULAR:  Regular rate and rhythm.  No murmurs. LUNGS:  Clear to auscultation bilaterally.  No rales. ABDOMEN:  Mild distention.  Active bowel sounds.  Mild pelvic tenderness. EXTREMITIES:  No cyanosis, clubbing, or edema. MUSCULOSKELETAL:  2+ distal pulses bilaterally.  LABS AND STUDIES:  Sodium 107, potassium 3.5, chloride  75, CO2 of 22, BUN 9, creatinine less than 0.47, glucose 126, mag 1.8, troponin 0.3 and 0.39, serum osmolality 221.  Urine sodium 41, urine potassium 36.  Urine osmolality pending.  Urinalysis 15 ketones, large blood, 100 protein, negative nitrites, large leukocytes.  White blood cells 21-50, red blood cells 11-20, many bacteria.  CT head negative.  Chest x-ray, no evidence of pneumonia.  Repeat chest x-ray pending.  ASSESSMENT/PLAN:  This is a 77 year old female who presents with dysuria, nausea, vomiting, diarrhea x1 day and hyponatremia. 1. Hyponatremia.  Etiology unknown, appears to be euvolemic     hyponatremia, no electrolyte or acid base disorder.   Recommend     checking TSH and cortisol and monitor the patient's mental status.     We will treat with 3% sodium chloride slowly at 20 mL an hour.     This should correct serum sodium around 8-10 mEq per liter in 24 hours.     Recheck BMET every 4 hours.  Discontinue 3% when sodium     concentration is near 120.  We suspect SIADH vs.medication related (losartan). 2. Non-ST elevation myocardial infarction.  Troponins are trending up.     Cardiology is following.  Possible acute coronary syndrome/plaque     rupture versus demand ischemia.  The patient is on aspirin,     Lovenox, beta-blocker, and statin.  Cardiology recommends cardiac     cath when her hypernatremia resolves. 3. ID.  The patient is currently on Rocephin IV for presumed urinary     tract infection, awaiting urine cultures and sensitivities. 4. Hypertension.  Blood pressure is well controlled on metoprolol. 5. Hyperlipidemia.  Continue with Pravachol per primary team.    ______________________________ Barnabas Lister, MD   ______________________________ Maree Krabbe, M.D.    ID/MEDQ  D:  06/24/2011  T:  06/25/2011  Job:  [id number]  Electronically Signed by Barnabas Lister MD on 06/30/2011 03:04:03 PM Electronically Signed by Delano Metz M.D. on 07/16/2011 08:09:20 AM

## 2011-07-17 NOTE — Discharge Summary (Signed)
NAME:  Carla Robles, Carla Robles NO.:  [id number]  MEDICAL RECORD NO.:  [id number]  LOCATION:  2925                         FACILITY:  [facility]  PHYSICIAN:  Marcellus Scott, MD     DATE OF BIRTH:  Feb 11, 1934  DATE OF ADMISSION:  06/23/2011 DATE OF DISCHARGE:  06/27/2011                              DISCHARGE SUMMARY   PRIMARY CARE PHYSICIAN:  Maryelizabeth Rowan, MD  CARDIOLOGIST:  Marca Ancona, MD  DISCHARGE DIAGNOSIS: 1. Profound hyponatremia, possibly multifactorial including syndrome     of inappropriate antidiuretic hormone secretion, improved. 2. Hypokalemia. 3. Non-ST elevation myocardial infarction secondary to demand     ischemia.  Negative Lexiscan Myoview. 4. Hypertension. 5. Hyperlipidemia. 6. History of breast cancer. 7. Presumed urinary tract infection, treated.  DISCHARGE MEDICATIONS: 1. Enteric-coated aspirin 81 mg p.o. daily. 2. Pravastatin 20 mg p.o. daily. 3. Vitamin D3 3000 units over-the-counter 1 tablet p.o. daily. 4. Calcium carbonate Os-Cal 1250 over-the-counter 1 tablet p.o. daily. 5. Calcium carbonate 600/vitamin D 400 over-the-counter 1 tablet     daily. 6. Fish oil 1000 mg p.o. daily. 7. Multivitamins 1 tablet p.o. daily. 8. Toprol-XL 50 mg p.o. daily.  DISCONTINUED MEDICATIONS: 1. Losartan. 2. WelChol.  IMAGING: 1. Steffanie Dunn on June 26, 2011.  Impression, normal study,     demonstrating no evidence of inducible myocardial ischemia.  Left     ventricle function was normal.  Quantitative ejection fraction of     85% is likely overestimated.  Wall motion analysis is normal. 2. Chest x-ray on July 22.  Impression, no active disease. 3. Chest x-ray on July 21.  Impression;     a.     Densities projecting over the lower lobes could represent      asymmetric nipple shadows.  Repeat chest x-rays were recommended.     b.     No evidence of pneumonia. 4. CT of the head without contrast.  Impression, mild atrophy and     small-vessel  disease.  No acute intracranial findings. 5. A 2-D echocardiogram on July 22.  Impression, normal left     ventricular size and systolic function, mild left ventricular     hypertrophy, EF 60-65%.  No regional wall motion abnormalities.     Normal RV size and systolic function.  No significant valvular     dysfunction.  LABORATORY DATA:  ESR is 9.  Basic metabolic panel today significant for sodium 134, potassium 3.4, and glucose 100.  CBC within normal limits. Random cortisol was 23.  Urine culture was suggestive of contamination. Serum osmolarity was 258.  Hepatic panel only significant for albumin of 2.9 and serum sodium 123.  Cardiac enzymes showed elevated CK-MB and relative index, free T4 is 1.34, TSH 1.171.  Urine osmolarity was 337, hemoglobin A1c 6.1.  The patient had an elevated troponin of 0.39, mildly elevated CK in the 202 range, urinalysis had shown 21-50 white blood cells and many bacteria.  CBC had shown mild leukocytosis of 13.6. Admitting sodium was 108, chloride of 72, glucose of 144, and ammonia was 54.  CONSULTATIONS: 1. Cardiology, Dr. Marca Ancona. 2. Nephrology, Dr. Fayrene Fearing Deterding.  DIET:  Heart-healthy diet.  ACTIVITIES:  Ad lib.  COMPLAINTS TODAY:  None.  PHYSICAL EXAMINATION:  GENERAL:  The patient is in no obvious distress. VITAL SIGNS:  Telemetry shows sinus bradycardia in the 50s to sinus rhythm in the 70s with no other arrhythmia alarms.  Temperature is 97.7 degrees Fahrenheit, heart rate 77 per minute, blood pressure 131/74 mmHg, respirations 20 per minute, and saturating 99% on room air. RESPIRATORY SYSTEM:  Clear. CARDIOVASCULAR SYSTEM:  First and second heart sounds are regular.  No JVD. ABDOMEN:  Nondistended, soft and bowel sounds present. CENTRAL NERVOUS SYSTEM:  The patient is awake, alert, oriented x3 with no focal neurological deficits.  HOSPITAL COURSE:  Ms. Polendo is a pleasant 77 year old female patient with history of  hypertension, hyperlipidemia, breast cancer status post lumpectomy and radiation 14 years ago, who was recently started on losartan for her hypertension.  She presented with 2 days history of vomiting and diarrhea.  Although there is history of frequency, hesitancy and pelvic pressure indicated on her history and physical note, the patient indicates that she has chronic urinary frequency, but denies any dysuria or urgency or fevers or chills.  She was found to have sodium of 108 and a troponin of 0.33 and some ST-T changes.  She was thereby admitted for further evaluation and management.  1. Hyponatremia.  This was possibly multifactorial secondary to nausea     and vomiting and SIADH of undetermined etiology.  This may also     have been secondary to her losartan which was recently started.     Nephrology was consulted.  She was treated with hypertonic saline.     Her sodium has subsequently improved.  Unclear as to the cause of     her SIADH and may consider further evaluation as an outpatient as     deemed necessary.  Also recommend repeating her basic metabolic     panel in a few days from discharge. She has past history of breast cancer and if the hyponatremia persists then may have to evaluate for recurrence. 2. Non-ST-elevation MI secondary to demand ischemia possibly from her     nausea and vomiting.  Cardiology was consulted and they performed a     Lexiscan Myoview which is normal.  This dictator discussed with Dr.     Shirlee Latch, who recommended medications as above and outpatient     followup and has cleared her for discharge. 3. Hypokalemia.  We will replete prior to discharge. 4. Hyperlipidemia.  Cardiologist recommend pravastatin instead of her     WelChol. 5. Hypertension.  Her losartan is discontinued for possible etiology     of her SIADH.  The patient will be on beta-blockers and is     reasonably controlled. 6. Presumed urinary tract infection.  The patient complete 3  days of     IV Rocephin which is adequate for uncomplicated cystitis.  She has     no further symptoms.  DISPOSITION:  The patient is discharged home in stable condition.  FOLLOWUP RECOMMENDATIONS: 1. With Dr. Maryelizabeth Rowan.  Here follow up in 7-10 days with repeat     basic metabolic panel. 2. With Dr. Marca Ancona.  MD's office will call with an appointment.  Time taken in coordinating this discharge 30 minutes.     Marcellus Scott, MD     AH/MEDQ  D:  06/27/2011  T:  06/27/2011  Job:  [id number]  cc:   Maryelizabeth Rowan, M.D. Marca Ancona, MD  Electronically Signed  by Marcellus Scott MD on 07/17/2011 11:01:00 PM

## 2011-09-06 ENCOUNTER — Encounter: Payer: Self-pay | Admitting: Cardiology

## 2011-09-06 ENCOUNTER — Ambulatory Visit (INDEPENDENT_AMBULATORY_CARE_PROVIDER_SITE_OTHER): Payer: Medicare Other | Admitting: Cardiology

## 2011-09-06 VITALS — BP 150/82 | HR 72 | Ht 63.0 in | Wt 132.0 lb

## 2011-09-06 DIAGNOSIS — E871 Hypo-osmolality and hyponatremia: Secondary | ICD-10-CM

## 2011-09-06 DIAGNOSIS — R748 Abnormal levels of other serum enzymes: Secondary | ICD-10-CM

## 2011-09-06 DIAGNOSIS — R7989 Other specified abnormal findings of blood chemistry: Secondary | ICD-10-CM

## 2011-09-06 LAB — BASIC METABOLIC PANEL
BUN: 20 mg/dL (ref 6–23)
CO2: 28 mEq/L (ref 19–32)
Chloride: 105 mEq/L (ref 96–112)
Creatinine, Ser: 0.7 mg/dL (ref 0.4–1.2)
Potassium: 4 mEq/L (ref 3.5–5.1)

## 2011-09-06 NOTE — Patient Instructions (Signed)
Your physician recommends that you have  lab work today--BMET 790.6  You do not need to schedule a follow-up appointment with Dr Shirlee Latch.

## 2011-09-07 DIAGNOSIS — R748 Abnormal levels of other serum enzymes: Secondary | ICD-10-CM | POA: Insufficient documentation

## 2011-09-07 NOTE — Assessment & Plan Note (Signed)
Patient had a mild elevation of troponin and ST depression on ECG in the setting of profound hyponatremia.  Lexiscan myoview showed no ischemia or infarction and echo was unremarkable.  She never had chest pain or dyspnea.  I think that the rise in cardiac enzymes was due to a form of demand ischemia associated with her metabolic derangements. No further cardiac workup indicated at this time.  It would be reasonable for her to continue on ASA 81 mg daily.

## 2011-09-07 NOTE — Assessment & Plan Note (Signed)
The precipitant for hyponatremic episode is unclear.  She did have nausea, vomiting, and some diarrhea prior to presentation but symptoms only lasted for about 1 day, and I am unsure if this would be long enough to cause such profound hyponatremia.  It is possible that it was caused by SIADH, and the nausea and vomiting was simply a symptom of hyponatremia.  Losartan can cause hyponatremia rarely but she had only taken 1 dose.  Regardless, would avoid losartan in the future.  I will get a BMET today to follow sodium.   Prn followup.

## 2011-09-07 NOTE — Progress Notes (Signed)
PCP: Dr. Duanne Guess  77 yo with history of HTN and prior breast cancer was admitted in 7/12 with profound hyponatremia and elevated troponin. Paitent developed severe nausea and vomiting with some diarrhea for about a day prior to admission.  She had also just started losartan (had taken 1 dose).  She came to the ER and was found to have a sodium level of 108.  She did not have any nausea or vomiting in the hospital.  Sodium was corrected with 3% saline.  She was also noted to have an elevation in troponin to 0.39 with diffuse ST depression on ECG.  Lexiscan myoview and echo were unremarkable.  She never had any chest pain.  Head CT was negative.  Cause of hyponatremia was never fully elucidated: it could have been due to the nausea/vomiting with dehydration (but this had only gone on for about a day), or the cause could have been SIADH with the nausea and vomiting a symptom of the hyponatremia itself.    Since discharge, she is doing well.  No exertional dyspnea or chest pain.  BP is high today in the office but she checks it frequently at home and it is always < 140 systolic.   Labs (7/12): Na 134, creatinine 0.67, TSH normal  PMH: 1. HTN 2. Hyperlipidemia 3. Breast cancer s/p lumpectomy and radiation 4. Hyponatremia: Episode of profound hyponatremia in 7/12.  5. Echo (7/12): EF 60-65%, mild LV hypertrophy, normal RV size and systolic function, no regional wall motion abnormalities, grade I diastolic dysfunction.  6. Elevated troponin in the setting of profound hyponatremia.  Lexiscan myoview (7/12): No ischemia or infarction.   SH: Lives in [place].  Retired Adult nurse.  Married with 3 sons.  Quit smoking in 1969.   FH: Mother with CVA, HTN.  Father with dementia, DM.   Current Outpatient Prescriptions  Medication Sig Dispense Refill  . aspirin 81 MG tablet Take 81 mg by mouth daily.        . calcium carbonate (OS-CAL - DOSED IN MG OF ELEMENTAL CALCIUM) 1250 MG tablet Take 1 tablet by  mouth daily.        . Calcium Carbonate-Vitamin D (CALTRATE 600+D) 600-400 MG-UNIT per chew tablet Chew 1 tablet by mouth daily.        . Cholecalciferol (VITAMIN D3) 3000 UNITS TABS Take 1 tablet by mouth daily.        . fish oil-omega-3 fatty acids 1000 MG capsule Take 1 g by mouth daily.        . metoprolol (TOPROL-XL) 50 MG 24 hr tablet Take 50 mg by mouth daily.        . Multiple Vitamin (MULTIVITAMIN) tablet Take 1 tablet by mouth daily.        . pravastatin (PRAVACHOL) 20 MG tablet Take 20 mg by mouth daily.          BP 150/82  Pulse 72  Ht 5\' 3"  (1.6 m)  Wt 132 lb (59.875 kg)  BMI 23.38 kg/m2 General: NAD Neck: No JVD, no thyromegaly or thyroid nodule.  Lungs: Clear to auscultation bilaterally with normal respiratory effort. CV: Nondisplaced PMI.  Heart regular S1/S2, no S3/S4, no murmur.  No peripheral edema.  No carotid bruit.  Normal pedal pulses.  Abdomen: Soft, nontender, no hepatosplenomegaly, no distention.  Neurologic: Alert and oriented x 3.  Psych: Normal affect. Extremities: No clubbing or cyanosis.

## 2011-12-28 ENCOUNTER — Encounter: Payer: Self-pay | Admitting: Internal Medicine

## 2012-01-01 DIAGNOSIS — H26499 Other secondary cataract, unspecified eye: Secondary | ICD-10-CM | POA: Diagnosis not present

## 2012-01-31 DIAGNOSIS — I10 Essential (primary) hypertension: Secondary | ICD-10-CM | POA: Diagnosis not present

## 2012-01-31 DIAGNOSIS — Z1211 Encounter for screening for malignant neoplasm of colon: Secondary | ICD-10-CM | POA: Diagnosis not present

## 2012-01-31 DIAGNOSIS — Z79899 Other long term (current) drug therapy: Secondary | ICD-10-CM | POA: Diagnosis not present

## 2012-01-31 DIAGNOSIS — E871 Hypo-osmolality and hyponatremia: Secondary | ICD-10-CM | POA: Diagnosis not present

## 2012-01-31 DIAGNOSIS — Z124 Encounter for screening for malignant neoplasm of cervix: Secondary | ICD-10-CM | POA: Diagnosis not present

## 2012-01-31 DIAGNOSIS — E785 Hyperlipidemia, unspecified: Secondary | ICD-10-CM | POA: Diagnosis not present

## 2012-01-31 DIAGNOSIS — H6120 Impacted cerumen, unspecified ear: Secondary | ICD-10-CM | POA: Diagnosis not present

## 2012-01-31 DIAGNOSIS — E559 Vitamin D deficiency, unspecified: Secondary | ICD-10-CM | POA: Diagnosis not present

## 2012-01-31 DIAGNOSIS — Z0000 Encounter for general adult medical examination without abnormal findings: Secondary | ICD-10-CM | POA: Diagnosis not present

## 2012-01-31 DIAGNOSIS — Z853 Personal history of malignant neoplasm of breast: Secondary | ICD-10-CM | POA: Diagnosis not present

## 2012-02-14 ENCOUNTER — Encounter: Payer: Self-pay | Admitting: Internal Medicine

## 2012-03-06 DIAGNOSIS — R6889 Other general symptoms and signs: Secondary | ICD-10-CM | POA: Diagnosis not present

## 2012-03-06 DIAGNOSIS — E039 Hypothyroidism, unspecified: Secondary | ICD-10-CM | POA: Diagnosis not present

## 2012-03-21 ENCOUNTER — Ambulatory Visit (AMBULATORY_SURGERY_CENTER): Payer: Medicare Other | Admitting: *Deleted

## 2012-03-21 VITALS — Ht 63.0 in | Wt 138.0 lb

## 2012-03-21 DIAGNOSIS — Z1211 Encounter for screening for malignant neoplasm of colon: Secondary | ICD-10-CM

## 2012-03-21 MED ORDER — PEG-KCL-NACL-NASULF-NA ASC-C 100 G PO SOLR: ORAL | Status: DC

## 2012-04-02 ENCOUNTER — Other Ambulatory Visit: Payer: Medicare Other | Admitting: Internal Medicine

## 2012-04-07 ENCOUNTER — Other Ambulatory Visit: Payer: Medicare Other | Admitting: Internal Medicine

## 2012-04-22 ENCOUNTER — Ambulatory Visit (AMBULATORY_SURGERY_CENTER): Payer: Medicare Other | Admitting: Internal Medicine

## 2012-04-22 ENCOUNTER — Encounter: Payer: Self-pay | Admitting: Internal Medicine

## 2012-04-22 VITALS — BP 149/74 | HR 61 | Temp 97.6°F | Resp 16 | Ht 63.0 in | Wt 138.0 lb

## 2012-04-22 DIAGNOSIS — K5730 Diverticulosis of large intestine without perforation or abscess without bleeding: Secondary | ICD-10-CM

## 2012-04-22 DIAGNOSIS — Z860100 Personal history of colon polyps, unspecified: Secondary | ICD-10-CM

## 2012-04-22 DIAGNOSIS — R933 Abnormal findings on diagnostic imaging of other parts of digestive tract: Secondary | ICD-10-CM

## 2012-04-22 DIAGNOSIS — K621 Rectal polyp: Secondary | ICD-10-CM | POA: Diagnosis not present

## 2012-04-22 DIAGNOSIS — Z1211 Encounter for screening for malignant neoplasm of colon: Secondary | ICD-10-CM

## 2012-04-22 DIAGNOSIS — K620 Anal polyp: Secondary | ICD-10-CM

## 2012-04-22 HISTORY — DX: Personal history of colon polyps, unspecified: Z86.0100

## 2012-04-22 HISTORY — DX: Personal history of colonic polyps: Z86.010

## 2012-04-22 MED ORDER — SODIUM CHLORIDE 0.9 % IV SOLN: 500.0000 mL | INTRAVENOUS | Status: DC

## 2012-04-22 NOTE — Patient Instructions (Signed)
YOU HAD AN ENDOSCOPIC PROCEDURE TODAY AT THE [facility]: Refer to the procedure report that was given to you for any specific questions about what was found during the examination.  If the procedure report does not answer your questions, please call your gastroenterologist to clarify.  If you requested that your care partner not be given the details of your procedure findings, then the procedure report has been included in a sealed envelope for you to review at your convenience later.  YOU SHOULD EXPECT: Some feelings of bloating in the abdomen. Passage of more gas than usual.  Walking can help get rid of the air that was put into your GI tract during the procedure and reduce the bloating. If you had a lower endoscopy (such as a colonoscopy or flexible sigmoidoscopy) you may notice spotting of blood in your stool or on the toilet paper. If you underwent a bowel prep for your procedure, then you may not have a normal bowel movement for a few days.  DIET: Your first meal following the procedure should be a light meal and then it is ok to progress to your normal diet.  A half-sandwich or bowl of soup is an example of a good first meal.  Heavy or fried foods are harder to digest and may make you feel nauseous or bloated.  Likewise meals heavy in dairy and vegetables can cause extra gas to form and this can also increase the bloating.  Drink plenty of fluids but you should avoid alcoholic beverages for 24 hours.  ACTIVITY: Your care partner should take you home directly after the procedure.  You should plan to take it easy, moving slowly for the rest of the day.  You can resume normal activity the day after the procedure however you should NOT DRIVE or use heavy machinery for 24 hours (because of the sedation medicines used during the test).    SYMPTOMS TO REPORT IMMEDIATELY: A gastroenterologist can be reached at any hour.  During normal business hours, 8:30 AM to 5:00 PM Monday through Friday,  call [phone].  After hours and on weekends, please call the GI answering service at [phone] who will take a message and have the physician on call contact you.   Following lower endoscopy (colonoscopy or flexible sigmoidoscopy):  Excessive amounts of blood in the stool  Significant tenderness or worsening of abdominal pains  Swelling of the abdomen that is new, acute  Fever of 100F or higher  Following upper endoscopy (EGD)  Vomiting of blood or coffee ground material  New chest pain or pain under the shoulder blades  Painful or persistently difficult swallowing  New shortness of breath  Fever of 100F or higher  Black, tarry-looking stools  FOLLOW UP: If any biopsies were taken you will be contacted by phone or by letter within the next 1-3 weeks.  Call your gastroenterologist if you have not heard about the biopsies in 3 weeks.  Our staff will call the home number listed on your records the next business day following your procedure to check on you and address any questions or concerns that you may have at that time regarding the information given to you following your procedure. This is a courtesy call and so if there is no answer at the home number and we have not heard from you through the emergency physician on call, we will assume that you have returned to your regular daily activities without incident.  SIGNATURES/CONFIDENTIALITY: You and/or your care   partner have signed paperwork which will be entered into your electronic medical record.  These signatures attest to the fact that that the information above on your After Visit Summary has been reviewed and is understood.  Full responsibility of the confidentiality of this discharge information lies with you and/or your care-partner.  

## 2012-04-22 NOTE — Progress Notes (Signed)
Patient did not experience any of the following events: a burn prior to discharge; a fall within the facility; wrong site/side/patient/procedure/implant event; or a hospital transfer or hospital admission upon discharge from the facility. (G8907) Patient did not have preoperative order for IV antibiotic SSI prophylaxis. (G8918)  

## 2012-04-22 NOTE — Op Note (Signed)
[facility] [address] [place], [place]  [address]  COLONOSCOPY PROCEDURE REPORT  PATIENT:  Carla Robles, Carla Robles  MR#:  [id number] BIRTHDATE:  21-Jan-1934, 77 yrs. old  GENDER:  female ENDOSCOPIST:  Iva Boop, MD, [facility]  PROCEDURE DATE:  04/22/2012 PROCEDURE:  Colonoscopy with biopsy ASA CLASS:  Class II INDICATIONS:  surveillance and high-risk screening, history of pre-cancerous (adenomatous) colon polyps 2 diminutive adenomas removed in 12/2006 index MEDICATIONS:   These medications were titrated to patient response per physician's verbal order, Fentanyl 50 mcg IV, Versed 5 mg IV  DESCRIPTION OF PROCEDURE:   After the risks benefits and alternatives of the procedure were thoroughly explained, informed consent was obtained.  Digital rectal exam was performed and revealed no abnormalities.   The LB [id number] endoscope was introduced through the anus and advanced to the cecum, which was identified by both the appendix and ileocecal valve, without limitations.  The quality of the prep was excellent, using MoviPrep.  The instrument was then slowly withdrawn as the colon was fully examined. <<PROCEDUREIMAGES>>  FINDINGS:  There was a possible polyp in the rectum. Flat mucosal changes suggestive of polyp vs. inflammation in distal rectum up to anal verge. 15 mm size. Multiple biopsies were obtained and sent to pathology.  Moderate diverticulosis was found in the sigmoid colon.  This was otherwise a normal examination of the colon. Includes right colon retroflexion.   Retroflexed views in the rectum revealed internal hemorrhoids.    The time to cecum = 4:58 minutes. The scope was then withdrawn in 12:28 minutes from the cecum and the procedure completed. COMPLICATIONS:  None ENDOSCOPIC IMPRESSION: 1) Polyp, possible in the rectum - biopsied 2) Moderate diverticulosis in the sigmoid colon 3) Internal hemorrhoids 4) Otherwise normal examination - excellent  prep RECOMMENDATIONS: 1) Await pathology results  Iva Boop, MD, Clement[facility] Graham  CC:  Maryelizabeth Rowan, MD and The Patient  n. eSIGNED:   Iva Boop at 04/22/2012 02:01 PM  Peyton Bottoms, [id number]

## 2012-04-23 ENCOUNTER — Telehealth: Payer: Self-pay | Admitting: *Deleted

## 2012-04-23 NOTE — Telephone Encounter (Signed)
  Follow up Call-  Call back number 04/22/2012  Post procedure Call Back phone  # [phone]  Permission to leave phone message Yes     Patient questions:  Do you have a fever, pain , or abdominal swelling? no Pain Score  0 *  Have you tolerated food without any problems? yes  Have you been able to return to your normal activities? yes  Do you have any questions about your discharge instructions: Diet   no Medications  no Follow up visit  no  Do you have questions or concerns about your Care? no  Actions: * If pain score is 4 or above: No action needed, pain <4.

## 2012-04-30 ENCOUNTER — Encounter: Payer: Self-pay | Admitting: Internal Medicine

## 2012-04-30 NOTE — Progress Notes (Signed)
Quick Note:  Mucosal prolapse polyp No recall colonoscopy ______

## 2012-07-31 DIAGNOSIS — I10 Essential (primary) hypertension: Secondary | ICD-10-CM | POA: Diagnosis not present

## 2012-07-31 DIAGNOSIS — Z79899 Other long term (current) drug therapy: Secondary | ICD-10-CM | POA: Diagnosis not present

## 2012-07-31 DIAGNOSIS — E785 Hyperlipidemia, unspecified: Secondary | ICD-10-CM | POA: Diagnosis not present

## 2012-08-21 DIAGNOSIS — Z79899 Other long term (current) drug therapy: Secondary | ICD-10-CM | POA: Diagnosis not present

## 2012-08-21 DIAGNOSIS — E785 Hyperlipidemia, unspecified: Secondary | ICD-10-CM | POA: Diagnosis not present

## 2012-08-21 DIAGNOSIS — Z23 Encounter for immunization: Secondary | ICD-10-CM | POA: Diagnosis not present

## 2012-08-21 DIAGNOSIS — I10 Essential (primary) hypertension: Secondary | ICD-10-CM | POA: Diagnosis not present

## 2012-08-26 DIAGNOSIS — M899 Disorder of bone, unspecified: Secondary | ICD-10-CM | POA: Diagnosis not present

## 2012-10-20 DIAGNOSIS — Z1231 Encounter for screening mammogram for malignant neoplasm of breast: Secondary | ICD-10-CM | POA: Diagnosis not present

## 2012-11-20 DIAGNOSIS — E785 Hyperlipidemia, unspecified: Secondary | ICD-10-CM | POA: Diagnosis not present

## 2012-11-20 DIAGNOSIS — I10 Essential (primary) hypertension: Secondary | ICD-10-CM | POA: Diagnosis not present

## 2012-12-22 DIAGNOSIS — I10 Essential (primary) hypertension: Secondary | ICD-10-CM | POA: Diagnosis not present

## 2013-01-13 DIAGNOSIS — H26499 Other secondary cataract, unspecified eye: Secondary | ICD-10-CM | POA: Diagnosis not present

## 2013-01-19 DIAGNOSIS — I10 Essential (primary) hypertension: Secondary | ICD-10-CM | POA: Diagnosis not present

## 2013-01-21 DIAGNOSIS — M25579 Pain in unspecified ankle and joints of unspecified foot: Secondary | ICD-10-CM | POA: Diagnosis not present

## 2013-01-21 DIAGNOSIS — I10 Essential (primary) hypertension: Secondary | ICD-10-CM | POA: Diagnosis not present

## 2013-05-21 DIAGNOSIS — M25579 Pain in unspecified ankle and joints of unspecified foot: Secondary | ICD-10-CM | POA: Diagnosis not present

## 2013-05-21 DIAGNOSIS — I10 Essential (primary) hypertension: Secondary | ICD-10-CM | POA: Diagnosis not present

## 2013-06-09 DIAGNOSIS — M659 Synovitis and tenosynovitis, unspecified: Secondary | ICD-10-CM | POA: Diagnosis not present

## 2013-06-09 DIAGNOSIS — M25579 Pain in unspecified ankle and joints of unspecified foot: Secondary | ICD-10-CM | POA: Diagnosis not present

## 2013-06-19 DIAGNOSIS — M76829 Posterior tibial tendinitis, unspecified leg: Secondary | ICD-10-CM | POA: Diagnosis not present

## 2013-07-28 DIAGNOSIS — S0120XA Unspecified open wound of nose, initial encounter: Secondary | ICD-10-CM | POA: Diagnosis not present

## 2013-07-28 DIAGNOSIS — IMO0002 Reserved for concepts with insufficient information to code with codable children: Secondary | ICD-10-CM | POA: Diagnosis not present

## 2013-08-05 ENCOUNTER — Emergency Department (HOSPITAL_BASED_OUTPATIENT_CLINIC_OR_DEPARTMENT_OTHER): Payer: Medicare Other

## 2013-08-05 ENCOUNTER — Emergency Department (HOSPITAL_BASED_OUTPATIENT_CLINIC_OR_DEPARTMENT_OTHER)
Admission: EM | Admit: 2013-08-05 | Discharge: 2013-08-05 | Disposition: A | Discharge status: Home / Self Care | Payer: Medicare Other | Attending: Emergency Medicine | Admitting: Emergency Medicine

## 2013-08-05 ENCOUNTER — Other Ambulatory Visit: Payer: Self-pay

## 2013-08-05 ENCOUNTER — Encounter (HOSPITAL_BASED_OUTPATIENT_CLINIC_OR_DEPARTMENT_OTHER): Payer: Self-pay | Admitting: Student

## 2013-08-05 DIAGNOSIS — S298XXA Other specified injuries of thorax, initial encounter: Secondary | ICD-10-CM | POA: Diagnosis not present

## 2013-08-05 DIAGNOSIS — R002 Palpitations: Secondary | ICD-10-CM | POA: Diagnosis not present

## 2013-08-05 DIAGNOSIS — Z79899 Other long term (current) drug therapy: Secondary | ICD-10-CM | POA: Insufficient documentation

## 2013-08-05 DIAGNOSIS — Z8639 Personal history of other endocrine, nutritional and metabolic disease: Secondary | ICD-10-CM | POA: Insufficient documentation

## 2013-08-05 DIAGNOSIS — Z860100 Personal history of colon polyps, unspecified: Secondary | ICD-10-CM | POA: Insufficient documentation

## 2013-08-05 DIAGNOSIS — Z87828 Personal history of other (healed) physical injury and trauma: Secondary | ICD-10-CM | POA: Insufficient documentation

## 2013-08-05 DIAGNOSIS — M129 Arthropathy, unspecified: Secondary | ICD-10-CM | POA: Insufficient documentation

## 2013-08-05 DIAGNOSIS — S0990XA Unspecified injury of head, initial encounter: Secondary | ICD-10-CM | POA: Insufficient documentation

## 2013-08-05 DIAGNOSIS — Z862 Personal history of diseases of the blood and blood-forming organs and certain disorders involving the immune mechanism: Secondary | ICD-10-CM | POA: Diagnosis not present

## 2013-08-05 DIAGNOSIS — Z853 Personal history of malignant neoplasm of breast: Secondary | ICD-10-CM | POA: Insufficient documentation

## 2013-08-05 DIAGNOSIS — I252 Old myocardial infarction: Secondary | ICD-10-CM | POA: Insufficient documentation

## 2013-08-05 DIAGNOSIS — I10 Essential (primary) hypertension: Secondary | ICD-10-CM | POA: Insufficient documentation

## 2013-08-05 DIAGNOSIS — E7800 Pure hypercholesterolemia, unspecified: Secondary | ICD-10-CM | POA: Diagnosis not present

## 2013-08-05 DIAGNOSIS — R531 Weakness: Secondary | ICD-10-CM

## 2013-08-05 DIAGNOSIS — W19XXXA Unspecified fall, initial encounter: Secondary | ICD-10-CM | POA: Insufficient documentation

## 2013-08-05 DIAGNOSIS — R5381 Other malaise: Secondary | ICD-10-CM | POA: Diagnosis not present

## 2013-08-05 DIAGNOSIS — Z7982 Long term (current) use of aspirin: Secondary | ICD-10-CM | POA: Diagnosis not present

## 2013-08-05 LAB — COMPREHENSIVE METABOLIC PANEL
Alkaline Phosphatase: 112 U/L (ref 39–117)
BUN: 19 mg/dL (ref 6–23)
CO2: 30 mEq/L (ref 19–32)
Calcium: 10.3 mg/dL (ref 8.4–10.5)
GFR calc Af Amer: 81 mL/min — ABNORMAL LOW (ref >90)
GFR calc non Af Amer: 70 mL/min — ABNORMAL LOW (ref >90)
Glucose, Bld: 118 mg/dL — ABNORMAL HIGH (ref 70–99)
Potassium: 3.7 mEq/L (ref 3.5–5.1)
Total Protein: 6.9 g/dL (ref 6.0–8.3)

## 2013-08-05 LAB — CBC WITH DIFFERENTIAL/PLATELET
Basophils Absolute: 0 10*3/uL (ref 0.0–0.1)
Basophils Relative: 0 % (ref 0–1)
HCT: 37.3 % (ref 36.0–46.0)
Lymphocytes Relative: 16 % (ref 12–46)
Monocytes Absolute: 1 10*3/uL (ref 0.1–1.0)
Neutro Abs: 6 10*3/uL (ref 1.7–7.7)
Neutrophils Relative %: 70 % (ref 43–77)
RDW: 12.6 % (ref 11.5–15.5)
WBC: 8.6 10*3/uL (ref 4.0–10.5)

## 2013-08-05 LAB — URINE MICROSCOPIC-ADD ON

## 2013-08-05 LAB — URINALYSIS, ROUTINE W REFLEX MICROSCOPIC
Bilirubin Urine: NEGATIVE
Leukocytes, UA: NEGATIVE
Nitrite: NEGATIVE
Specific Gravity, Urine: 1.013 (ref 1.005–1.030)
Urobilinogen, UA: 0.2 mg/dL (ref 0.0–1.0)
pH: 7.5 (ref 5.0–8.0)

## 2013-08-05 LAB — TROPONIN I: Troponin I: <0.3 ng/mL (ref <0.30)

## 2013-08-05 MED ORDER — SODIUM CHLORIDE 0.9 % IV BOLUS (SEPSIS)
1000.0000 mL | Freq: Once | INTRAVENOUS | Status: AC
  Administered 2013-08-05: 1000 mL via INTRAVENOUS

## 2013-08-05 NOTE — ED Notes (Signed)
Pt transported to radiology.

## 2013-08-05 NOTE — ED Notes (Signed)
Pt walked in the dept. with out any trouble.

## 2013-08-05 NOTE — ED Notes (Signed)
Pt is unable to void at present time. 

## 2013-08-05 NOTE — ED Provider Notes (Signed)
CSN: [id number]     Arrival date & time 08/05/13  1709 History   First MD Initiated Contact with Patient 08/05/13 1732     Chief Complaint  Patient presents with  . Weakness  . Palpitations   (Consider location/radiation/quality/duration/timing/severity/associated sxs/prior Treatment) Patient is a 79 y.o. female presenting with weakness and palpitations.  Weakness  Palpitations  Pt brought to the ED by husband, she reports feeling profoundly weak this afternoon. Did not eat much lunch and took a nap. When she woke up she felt like her heart was racing, but did not have CP, SOB or dizziness. She checked her vitals and found BP 160/70s and HR 106. She rested for a while and those symptoms improved, although she has continued to feel very weak. She had a mechanical fall about a week ago in which she stumbled on a sidewalk and fell forward, hitting her head but did not have LOC. She had abrasion to nose and R hand. Went to PCP where her wounds were cleaned, but did not have any imaging. Husband states she has been less active since the fall, but no mental status changes. She had a similar, but more severe episode about 2 years ago, found to be profoundly hyponatremic then (Na 108) with borderline elevated troponin. She was admitted for correction of sodium and had neg Myoview at that time. No known CAD.   Past Medical History  Diagnosis Date  . Hypertension   . Hypercholesteremia   . Breast cancer   . NSTEMI (non-ST elevated myocardial infarction)     in setting of low Na:  Echo 7/22:  EF 60-65%, normal wall motion, mild LVH, grade 1 diast dysfxn, PASP 29.;  Myoview:  EF 85% and no ischemia  . SIADH (syndrome of inappropriate ADH production)   . Arthritis   . Personal history of  adenomatouscolonic polyps 04/22/2012    2 diminutive adenomas 12/2006 Leone Payor)   Past Surgical History  Procedure Laterality Date  . Breast lumpectomy    . Cataract extraction w/ intraocular lens  implant, bilateral   2011  . Colonoscopy     History reviewed. No pertinent family history. History  Substance Use Topics  . Smoking status: Former Games developer  . Smokeless tobacco: Never Used     Comment: quit 1969  . Alcohol Use: Yes     Comment: rarely a wine   OB History   Grav Para Term Preterm Abortions TAB SAB Ect Mult Living                 Review of Systems  Cardiovascular: Positive for palpitations.  Neurological: Positive for weakness.   All other systems reviewed and are negative except as noted in HPI.   Allergies  Other and Statins  Home Medications   Current Outpatient Rx  Name  Route  Sig  Dispense  Refill  . aspirin 81 MG tablet   Oral   Take 81 mg by mouth daily.           . calcium carbonate (OS-CAL - DOSED IN MG OF ELEMENTAL CALCIUM) 1250 MG tablet   Oral   Take 1 tablet by mouth daily.           . Calcium Carbonate-Vitamin D (CALTRATE 600+D) 600-400 MG-UNIT per chew tablet   Oral   Chew 1 tablet by mouth daily.           . Cholecalciferol (VITAMIN D3) 3000 UNITS TABS   Oral   Take 1 tablet  by mouth daily.           . fish oil-omega-3 fatty acids 1000 MG capsule   Oral   Take 1 g by mouth daily.           . metoprolol (TOPROL-XL) 50 MG 24 hr tablet   Oral   Take 50 mg by mouth daily.           . Multiple Vitamin (MULTIVITAMIN) tablet   Oral   Take 1 tablet by mouth daily.           . pravastatin (PRAVACHOL) 20 MG tablet   Oral   Take 20 mg by mouth daily.            BP 153/76  Pulse 78  Temp(Src) 98.2 F (36.8 C) (Oral)  Resp 16  SpO2 97% Physical Exam  Nursing note and vitals reviewed. Constitutional: She is oriented to person, place, and time. She appears well-developed and well-nourished.  HENT:  Head: Atraumatic.  Healing abrasion and contusion to bridge of nose and cheeks bilaterally  Eyes: EOM are normal. Pupils are equal, round, and reactive to light.  Neck: Normal range of motion. Neck supple.  Cardiovascular: Normal  rate, normal heart sounds and intact distal pulses.   Pulmonary/Chest: Effort normal and breath sounds normal.  Abdominal: Bowel sounds are normal. She exhibits no distension. There is no tenderness. There is no rebound.  Musculoskeletal: Normal range of motion. She exhibits no edema and no tenderness.  Neurological: She is alert and oriented to person, place, and time. She has normal strength. She displays normal reflexes. No cranial nerve deficit or sensory deficit. Coordination normal.  Skin: Skin is warm and dry. No rash noted.  Psychiatric: She has a normal mood and affect.    ED Course  Procedures (including critical care time) Labs Review Labs Reviewed  COMPREHENSIVE METABOLIC PANEL - Abnormal; Notable for the following:    Sodium 131 (*)    Chloride 91 (*)    Glucose, Bld 118 (*)    Total Bilirubin 0.2 (*)    GFR calc non Af Amer 70 (*)    GFR calc Af Amer 81 (*)    All other components within normal limits  URINALYSIS, ROUTINE W REFLEX MICROSCOPIC - Abnormal; Notable for the following:    Hgb urine dipstick MODERATE (*)    All other components within normal limits  URINE MICROSCOPIC-ADD ON - Abnormal; Notable for the following:    Bacteria, UA FEW (*)    All other components within normal limits  CBC WITH DIFFERENTIAL  TROPONIN I  TROPONIN I   Imaging Review Dg Chest 2 View  08/05/2013   *RADIOLOGY REPORT*  Clinical Data: Fall 1 week ago, palpitations, heart racing, general weakness, history hypertension and smoking, breast cancer  CHEST - 2 VIEW  Comparison: 06/24/2011  Findings: Surgical clips left breast and axilla. Normal heart size, mediastinal contours, and pulmonary vascularity. Focus of asymmetric pleural thickening at the lateral right upper chest is unchanged since 2012. Mild bronchitic and emphysematous changes consistent with COPD. No definite acute infiltrate, pleural effusion or pneumothorax. Bones appear slightly demineralized. Question pectus excavatum.   IMPRESSION: COPD changes. No definite acute abnormalities.   Original Report Authenticated By: Ulyses Southward, M.D.   Ct Head Wo Contrast  08/05/2013   CLINICAL DATA:  79 year old female with weakness, fall, unsteadiness. Blunt trauma.  EXAM: CT HEAD WITHOUT CONTRAST  TECHNIQUE: Contiguous axial images were obtained from the base of the skull through  the vertex without intravenous contrast.  COMPARISON:  06/23/2011.  FINDINGS: Visualized orbits and scalp soft tissues are within normal limits. Visualized paranasal sinuses and mastoids are clear. Calvarium intact.  Cerebral volume is within normal limits for age. No ventriculomegaly. No midline shift, mass effect, or evidence of intracranial mass lesion. No acute intracranial hemorrhage identified. Scattered mostly subcortical white matter hypodensity is nonspecific and stable. No evidence of cortically based acute infarction identified. No suspicious intracranial vascular hyperdensity. Dominant distal left vertebral artery.  IMPRESSION: No acute intracranial abnormality. No acute traumatic injury identified. Stable mild for age nonspecific white matter changes.   Electronically Signed   By: Augusto Gamble M.D.   On: 08/05/2013 18:29    MDM   1. Generalized weakness      Date: 08/05/2013  Rate: 84  Rhythm: normal sinus rhythm  QRS Axis: normal  Intervals: normal  ST/T Wave abnormalities: nonspecific ST changes  Conduction Disutrbances:none  Narrative Interpretation:   Old EKG Reviewed: changes noted, ST changes new from previous  8:45 PM Labs and imaging reviewed and negative. She has non-specific EKG changes but no CP or SOB and no dysthymias seen on monitor here. Repeat troponin remains negative. Pt feeling much better, discussed admission vs discharge with the patient and husband. She would prefer to go home, will ambulate in the department to ensure she is safe to be discharged and recommend close followup with PCP and Cardiology. Return to the ER for  any other concerns. Pt amenable to this plan.    Charles B. Bernette Mayers, MD 08/05/13 2118

## 2013-08-05 NOTE — ED Notes (Signed)
Tired, disconnected, unsteadiness x week s/p fall where she hit nose and face on side walk. PERRLA, [id number]

## 2013-08-12 DIAGNOSIS — R5381 Other malaise: Secondary | ICD-10-CM | POA: Diagnosis not present

## 2013-08-12 DIAGNOSIS — E871 Hypo-osmolality and hyponatremia: Secondary | ICD-10-CM | POA: Diagnosis not present

## 2013-08-12 DIAGNOSIS — I10 Essential (primary) hypertension: Secondary | ICD-10-CM | POA: Diagnosis not present

## 2013-08-17 ENCOUNTER — Telehealth: Payer: Self-pay | Admitting: *Deleted

## 2013-08-17 ENCOUNTER — Encounter: Payer: Self-pay | Admitting: Physician Assistant

## 2013-08-17 ENCOUNTER — Ambulatory Visit (INDEPENDENT_AMBULATORY_CARE_PROVIDER_SITE_OTHER): Payer: Medicare Other | Admitting: Physician Assistant

## 2013-08-17 VITALS — BP 151/80 | HR 90 | Ht 63.0 in | Wt 138.0 lb

## 2013-08-17 DIAGNOSIS — I10 Essential (primary) hypertension: Secondary | ICD-10-CM

## 2013-08-17 DIAGNOSIS — E871 Hypo-osmolality and hyponatremia: Secondary | ICD-10-CM

## 2013-08-17 DIAGNOSIS — E785 Hyperlipidemia, unspecified: Secondary | ICD-10-CM

## 2013-08-17 DIAGNOSIS — R9431 Abnormal electrocardiogram [ECG] [EKG]: Secondary | ICD-10-CM

## 2013-08-17 LAB — BASIC METABOLIC PANEL
BUN: 15 mg/dL (ref 6–23)
Calcium: 9.7 mg/dL (ref 8.4–10.5)
GFR: 68.94 mL/min (ref >60.00)
Glucose, Bld: 84 mg/dL (ref 70–99)
Sodium: 139 mEq/L (ref 135–145)

## 2013-08-17 NOTE — Telephone Encounter (Signed)
lmom normal labs; no changes to be made

## 2013-08-17 NOTE — Progress Notes (Signed)
[address] [place], [place]  [address] Phone: [phone] Fax:  [phone]  Date:  08/17/2013   ID:  Carla Robles, DOB Jul 29, 1934, MRN [id number]  PCP:  Willow Ora, MD  Cardiologist:  Dr. Marca Ancona     History of Present Illness: Carla Robles is a 79 y.o. female who returns for follow up after a recent trip to the ED with weakness.    She has a hx of HTN, HL and breast CA. She was admitted 06/2011 with profound hyponatremia (Na 108) in the setting of N/V and diarrhea. She had also started on an ARB prior to this. It was felt her low Na was multifactorial as well as related to SIADH. She was treated with hypertonic saline with eventual recovery of her Na. She had cardiac markers drawn in her evaluation and these were abnormal. Peak troponin was 0.39. She never had chest pain.  Myoview demonstrated EF 85% and no ischemia. Echo 06/2011: EF 60-65%, normal wall motion, mild LVH, grade 1 diast dysfxn, PASP 29. It was thought she had a Type 2 NSTEMI 2/2 demand ischemia in setting of illness and profound hyponatremia.  Last seen by Dr. Marca Ancona in 09/2011.  PRN f/u recommended.  She was seen in the ED 08/05/13 with weakness and palpitations.  Troponin was neg.  ECG demonstrated NSR, NSSTTW changes, QTc 437.  Head CT was unremarkable.  She was asked to f/u with cardiology.  She tells me that she tripped and fell the day before she went to the emergency room. The next day, she felt weak and was having some difficulty with balance. This prompted her visit to the emergency room. She denies having any palpitations. She has followed up with her PCP. She will have a physical exam next week with some extensive blood work. She denies chest discomfort or shortness of breath. She is NYHA class II. She denies exertional arm or jaw discomfort. She denies syncope or near-syncope. She denies orthopnea, PND or edema.  Labs (7/12):    TSH 1.171 Labs (10/12):  Na 142, K 4, Cr 0.7 Labs (9/14):     Na 131, K 3.7, Cr 0.8, ALT 13, Hgb 12.9   Wt Readings from Last 3 Encounters:  08/17/13 138 lb (62.596 kg)  04/22/12 138 lb (62.596 kg)  03/21/12 138 lb (62.596 kg)     Past Medical History  Diagnosis Date  . Hypertension   . Hypercholesteremia   . Breast cancer   . NSTEMI (non-ST elevated myocardial infarction)     in setting of low Na:  Echo 7/22:  EF 60-65%, normal wall motion, mild LVH, grade 1 diast dysfxn, PASP 29.;  Myoview:  EF 85% and no ischemia  . SIADH (syndrome of inappropriate ADH production)   . Arthritis   . Personal history of  adenomatouscolonic polyps 04/22/2012    2 diminutive adenomas 12/2006 Leone Payor)    Current Outpatient Prescriptions  Medication Sig Dispense Refill  . aspirin 81 MG tablet Take 81 mg by mouth daily.        . calcium carbonate (OS-CAL - DOSED IN MG OF ELEMENTAL CALCIUM) 1250 MG tablet Take 1 tablet by mouth daily.        . Calcium Carbonate-Vitamin D (CALTRATE 600+D) 600-400 MG-UNIT per chew tablet Chew 1 tablet by mouth daily.        . Cholecalciferol (VITAMIN D3) 3000 UNITS TABS Take 1 tablet by mouth daily.        Marland Kitchen  fish oil-omega-3 fatty acids 1000 MG capsule Take 1 g by mouth daily.        Marland Kitchen lisinopril (PRINIVIL,ZESTRIL) 10 MG tablet Take 10 mg by mouth daily.       . metoprolol (TOPROL-XL) 50 MG 24 hr tablet Take 50 mg by mouth daily.        . Multiple Vitamin (MULTIVITAMIN) tablet Take 1 tablet by mouth daily.        . pravastatin (PRAVACHOL) 20 MG tablet Take 20 mg by mouth daily.         No current facility-administered medications for this visit.    Allergies:    Allergies  Allergen Reactions  . Other     hmg coa redustase inhibitors - muscle aches  . Statins Other (See Comments)    Muscle aches     Social History:  The patient  reports that she has quit smoking. She has never used smokeless tobacco. She reports that  drinks alcohol. She reports that she does not use illicit drugs.   ROS:  Please see the history of  present illness.   She has a nonproductive cough.   All other systems reviewed and negative.   PHYSICAL EXAM: VS:  BP 151/80  Pulse 90  Ht 5\' 3"  (1.6 m)  Wt 138 lb (62.596 kg)  BMI 24.45 kg/m2 Well nourished, well developed, in no acute distress HEENT: normal Neck: no JVD Endocrine: No thyromegaly Cardiac:  normal S1, S2; RRR; no murmur Lungs:  clear to auscultation bilaterally, no wheezing, rhonchi or rales Abd: soft, nontender, no hepatomegaly Ext: no edema Skin: warm and dry Neuro:  CNs 2-12 intact, no focal abnormalities noted  EKG:  NSR, HR 90, normal axis, inferolateral ST depression     ASSESSMENT AND PLAN:  1. Abnormal EKG: She has no symptoms of chest pain or shortness of breath. She denies any symptoms that would suggest angina. She had a normal echocardiogram and stress test 2 years ago. I reviewed her ECG today with Dr. Verne Carrow.  I will set her up for a follow up echocardiogram to rule out wall motion abnormalities. I will also check a basic metabolic panel to reassess her sodium and potassium.  If the findings on echocardiogram are unremarkable, she can follow up with cardiology as needed. 2. Hypertension:  Blood pressure somewhat elevated today. She is not certain she took her medicines this morning. Continue to monitor. 3. Hyperlipidemia:  Followed by primary care. 4. Hyponatremia:  Repeat basic metabolic panel today. 5. Disposition:  Follow up with Dr. Shirlee Latch as needed or sooner if echocardiogram abnormal.  Signed, Tereso Newcomer, PA-C  08/17/2013 9:00 AM

## 2013-08-17 NOTE — Patient Instructions (Addendum)
YOU HAVE BEEN SCHEDULED FOR AN ECHO 08/21/13 @ 1 PM  YOU MAY FOLLOW UP AS NEEDED WITH DR. Shirlee Latch  LAB TODAY; BMET

## 2013-08-21 ENCOUNTER — Other Ambulatory Visit: Payer: Self-pay

## 2013-08-21 ENCOUNTER — Ambulatory Visit (HOSPITAL_COMMUNITY): Payer: Medicare Other | Attending: Cardiology

## 2013-08-21 DIAGNOSIS — R9431 Abnormal electrocardiogram [ECG] [EKG]: Secondary | ICD-10-CM | POA: Diagnosis not present

## 2013-08-21 DIAGNOSIS — I379 Nonrheumatic pulmonary valve disorder, unspecified: Secondary | ICD-10-CM | POA: Insufficient documentation

## 2013-08-21 DIAGNOSIS — I079 Rheumatic tricuspid valve disease, unspecified: Secondary | ICD-10-CM | POA: Insufficient documentation

## 2013-08-21 DIAGNOSIS — I10 Essential (primary) hypertension: Secondary | ICD-10-CM

## 2013-08-21 NOTE — Progress Notes (Signed)
Echocardiogram performed by Matt LeBeau  

## 2013-08-24 ENCOUNTER — Encounter: Payer: Self-pay | Admitting: Physician Assistant

## 2013-09-10 DIAGNOSIS — D126 Benign neoplasm of colon, unspecified: Secondary | ICD-10-CM | POA: Diagnosis not present

## 2013-09-10 DIAGNOSIS — K644 Residual hemorrhoidal skin tags: Secondary | ICD-10-CM | POA: Diagnosis not present

## 2013-09-10 DIAGNOSIS — M899 Disorder of bone, unspecified: Secondary | ICD-10-CM | POA: Diagnosis not present

## 2013-09-10 DIAGNOSIS — I10 Essential (primary) hypertension: Secondary | ICD-10-CM | POA: Diagnosis not present

## 2013-09-10 DIAGNOSIS — Z1239 Encounter for other screening for malignant neoplasm of breast: Secondary | ICD-10-CM | POA: Diagnosis not present

## 2013-09-10 DIAGNOSIS — E785 Hyperlipidemia, unspecified: Secondary | ICD-10-CM | POA: Diagnosis not present

## 2013-09-10 DIAGNOSIS — Z23 Encounter for immunization: Secondary | ICD-10-CM | POA: Diagnosis not present

## 2013-10-21 DIAGNOSIS — Z1231 Encounter for screening mammogram for malignant neoplasm of breast: Secondary | ICD-10-CM | POA: Diagnosis not present

## 2013-12-11 DIAGNOSIS — K644 Residual hemorrhoidal skin tags: Secondary | ICD-10-CM | POA: Diagnosis not present

## 2013-12-11 DIAGNOSIS — Z0000 Encounter for general adult medical examination without abnormal findings: Secondary | ICD-10-CM | POA: Diagnosis not present

## 2013-12-11 DIAGNOSIS — R3129 Other microscopic hematuria: Secondary | ICD-10-CM | POA: Diagnosis not present

## 2013-12-11 DIAGNOSIS — L570 Actinic keratosis: Secondary | ICD-10-CM | POA: Diagnosis not present

## 2013-12-18 DIAGNOSIS — R319 Hematuria, unspecified: Secondary | ICD-10-CM | POA: Diagnosis not present

## 2014-01-20 DIAGNOSIS — L570 Actinic keratosis: Secondary | ICD-10-CM | POA: Diagnosis not present

## 2014-03-17 DIAGNOSIS — E785 Hyperlipidemia, unspecified: Secondary | ICD-10-CM | POA: Diagnosis not present

## 2014-03-17 DIAGNOSIS — I10 Essential (primary) hypertension: Secondary | ICD-10-CM | POA: Diagnosis not present

## 2014-03-17 DIAGNOSIS — R3129 Other microscopic hematuria: Secondary | ICD-10-CM | POA: Diagnosis not present

## 2014-08-10 ENCOUNTER — Encounter: Payer: Self-pay | Admitting: Internal Medicine

## 2014-09-21 ENCOUNTER — Other Ambulatory Visit: Payer: Self-pay | Admitting: Family Medicine

## 2014-09-21 ENCOUNTER — Ambulatory Visit
Admission: RE | Admit: 2014-09-21 | Discharge: 2014-09-21 | Disposition: A | Discharge status: Home / Self Care | Payer: Medicare Other | Source: Ambulatory Visit | Attending: Family Medicine | Admitting: Family Medicine

## 2014-09-21 DIAGNOSIS — I10 Essential (primary) hypertension: Secondary | ICD-10-CM | POA: Diagnosis not present

## 2014-09-21 DIAGNOSIS — R609 Edema, unspecified: Secondary | ICD-10-CM

## 2014-09-21 DIAGNOSIS — I8393 Asymptomatic varicose veins of bilateral lower extremities: Secondary | ICD-10-CM | POA: Diagnosis not present

## 2014-09-21 DIAGNOSIS — M19072 Primary osteoarthritis, left ankle and foot: Secondary | ICD-10-CM | POA: Diagnosis not present

## 2014-09-21 DIAGNOSIS — M79662 Pain in left lower leg: Secondary | ICD-10-CM | POA: Diagnosis not present

## 2014-09-21 DIAGNOSIS — M24873 Other specific joint derangements of unspecified ankle, not elsewhere classified: Secondary | ICD-10-CM | POA: Diagnosis not present

## 2014-09-21 DIAGNOSIS — M7989 Other specified soft tissue disorders: Secondary | ICD-10-CM | POA: Diagnosis not present

## 2014-09-21 DIAGNOSIS — M25572 Pain in left ankle and joints of left foot: Secondary | ICD-10-CM | POA: Diagnosis not present

## 2014-10-14 DIAGNOSIS — R635 Abnormal weight gain: Secondary | ICD-10-CM | POA: Diagnosis not present

## 2014-10-14 DIAGNOSIS — R5383 Other fatigue: Secondary | ICD-10-CM | POA: Diagnosis not present

## 2014-10-14 DIAGNOSIS — I10 Essential (primary) hypertension: Secondary | ICD-10-CM | POA: Diagnosis not present

## 2014-10-14 DIAGNOSIS — R413 Other amnesia: Secondary | ICD-10-CM | POA: Diagnosis not present

## 2014-12-08 DIAGNOSIS — H0014 Chalazion left upper eyelid: Secondary | ICD-10-CM | POA: Diagnosis not present

## 2014-12-17 DIAGNOSIS — H0014 Chalazion left upper eyelid: Secondary | ICD-10-CM | POA: Diagnosis not present

## 2015-04-18 DIAGNOSIS — R5383 Other fatigue: Secondary | ICD-10-CM | POA: Diagnosis not present

## 2015-04-18 DIAGNOSIS — H6122 Impacted cerumen, left ear: Secondary | ICD-10-CM | POA: Diagnosis not present

## 2015-04-18 DIAGNOSIS — E559 Vitamin D deficiency, unspecified: Secondary | ICD-10-CM | POA: Diagnosis not present

## 2015-04-18 DIAGNOSIS — R413 Other amnesia: Secondary | ICD-10-CM | POA: Diagnosis not present

## 2015-04-18 DIAGNOSIS — I10 Essential (primary) hypertension: Secondary | ICD-10-CM | POA: Diagnosis not present

## 2015-04-18 DIAGNOSIS — R635 Abnormal weight gain: Secondary | ICD-10-CM | POA: Diagnosis not present

## 2015-04-19 ENCOUNTER — Other Ambulatory Visit: Payer: Self-pay | Admitting: Family Medicine

## 2015-04-19 DIAGNOSIS — R413 Other amnesia: Secondary | ICD-10-CM

## 2015-04-29 ENCOUNTER — Ambulatory Visit
Admission: RE | Admit: 2015-04-29 | Discharge: 2015-04-29 | Disposition: A | Discharge status: Home / Self Care | Payer: Medicare Other | Source: Ambulatory Visit | Attending: Family Medicine | Admitting: Family Medicine

## 2015-04-29 DIAGNOSIS — R413 Other amnesia: Secondary | ICD-10-CM

## 2015-04-29 DIAGNOSIS — Z853 Personal history of malignant neoplasm of breast: Secondary | ICD-10-CM | POA: Diagnosis not present

## 2015-05-16 DIAGNOSIS — I679 Cerebrovascular disease, unspecified: Secondary | ICD-10-CM | POA: Diagnosis not present

## 2015-05-16 DIAGNOSIS — R413 Other amnesia: Secondary | ICD-10-CM | POA: Diagnosis not present

## 2015-05-16 DIAGNOSIS — I10 Essential (primary) hypertension: Secondary | ICD-10-CM | POA: Diagnosis not present

## 2015-05-31 DIAGNOSIS — I679 Cerebrovascular disease, unspecified: Secondary | ICD-10-CM | POA: Diagnosis not present

## 2015-05-31 DIAGNOSIS — I10 Essential (primary) hypertension: Secondary | ICD-10-CM | POA: Diagnosis not present

## 2015-05-31 DIAGNOSIS — R413 Other amnesia: Secondary | ICD-10-CM | POA: Diagnosis not present

## 2015-05-31 DIAGNOSIS — R5383 Other fatigue: Secondary | ICD-10-CM | POA: Diagnosis not present

## 2015-06-28 DIAGNOSIS — Z1231 Encounter for screening mammogram for malignant neoplasm of breast: Secondary | ICD-10-CM | POA: Diagnosis not present

## 2015-06-30 DIAGNOSIS — Z1231 Encounter for screening mammogram for malignant neoplasm of breast: Secondary | ICD-10-CM | POA: Diagnosis not present

## 2015-06-30 DIAGNOSIS — N6001 Solitary cyst of right breast: Secondary | ICD-10-CM | POA: Diagnosis not present

## 2015-06-30 DIAGNOSIS — Z853 Personal history of malignant neoplasm of breast: Secondary | ICD-10-CM | POA: Diagnosis not present

## 2015-07-04 ENCOUNTER — Other Ambulatory Visit: Payer: Self-pay

## 2015-07-04 DIAGNOSIS — D0430 Carcinoma in situ of skin of unspecified part of face: Secondary | ICD-10-CM | POA: Diagnosis not present

## 2015-07-04 DIAGNOSIS — D0421 Carcinoma in situ of skin of right ear and external auricular canal: Secondary | ICD-10-CM | POA: Diagnosis not present

## 2015-07-04 DIAGNOSIS — C44310 Basal cell carcinoma of skin of unspecified parts of face: Secondary | ICD-10-CM | POA: Diagnosis not present

## 2015-07-04 DIAGNOSIS — C44319 Basal cell carcinoma of skin of other parts of face: Secondary | ICD-10-CM | POA: Diagnosis not present

## 2015-07-04 DIAGNOSIS — L309 Dermatitis, unspecified: Secondary | ICD-10-CM | POA: Diagnosis not present

## 2015-07-04 DIAGNOSIS — L820 Inflamed seborrheic keratosis: Secondary | ICD-10-CM | POA: Diagnosis not present

## 2015-07-04 DIAGNOSIS — D485 Neoplasm of uncertain behavior of skin: Secondary | ICD-10-CM | POA: Diagnosis not present

## 2015-07-07 ENCOUNTER — Encounter: Payer: Self-pay | Admitting: Neurology

## 2015-07-07 ENCOUNTER — Ambulatory Visit (INDEPENDENT_AMBULATORY_CARE_PROVIDER_SITE_OTHER): Payer: Medicare Other | Admitting: Neurology

## 2015-07-07 VITALS — BP 118/82 | HR 66 | Resp 16 | Wt 143.0 lb

## 2015-07-07 DIAGNOSIS — G3184 Mild cognitive impairment, so stated: Secondary | ICD-10-CM | POA: Diagnosis not present

## 2015-07-07 NOTE — Patient Instructions (Signed)
1. Continue Aricept 10mg  daily 2. Continue to monitor driving, DMV driving assessment would be helpful 3. Physical exercise and brain stimulation exercises are important for brain health 4. Follow-up in 1 year

## 2015-07-07 NOTE — Progress Notes (Signed)
NEUROLOGY CONSULTATION NOTE  Carla Robles MRN: [id number] DOB: 03-13-1934  Referring provider: Dr. Rachell Cipro Primary care provider: Dr. Rachell Cipro  Reason for consult:  Memory loss  Dear Dr Ernie Hew:  Thank you for your kind referral of Carla Robles for consultation of the above symptoms. Although her history is well known to you, please allow me to reiterate it for the purpose of our medical record. The patient was accompanied to the clinic by her husband who also provides collateral information. Records and images were personally reviewed where available.  HISTORY OF PRESENT ILLNESS: This is a pleasant 81 year old right-handed woman with a history of hypertension, hyperlipidemia, breast cancer, NSTEMI, presenting for worsening memory. She states "I knows I have some issues with it," because she would have to ask her husband things every once in a while. Her husband started noticing memory changes a little over a year ago, but it has become more noticeable recently, where friends have started noticing as well. A few months ago, she was supposed to meet her friend at a certain location, but forgot their meeting place and went to a different place. She forgets that she makes appointments with her friends. She continues to drive, but has called him at least 2-3 times that she was turned around and not sure how to get back. She does have a GPS in her car but did not know what address to put in. Her husband started putting her medications in a pillbox a couple of months ago to make sure she takes them, and they report she overall remembers her medications. She denies any missed bill payments except for 1 notice they got yesterday. She has occasional word-finding difficulties and problems multitasking. No personality changes. She started Aricept a few months ago, and initially reported soft stools, which she denies today. She denies any side effects on Aricept.  She has occasional mild  bilateral hand tremors. Otherwise, she denies any headaches, dizziness, diplopia, dysarthria, dysphagia, neck/back pain, focal numbness/tingling/weakness, bowel/bladder dysfunction. No anosmia, tremors, no falls. She denies any significant head injuries. Her father had memory problems.   Laboratory Data: 04/2015: CBC, CMP unremarkable. TSH 3.810, B12 356, vitamin D 42.8 I personally reviewed MRI brain without contrast which showed mild to moderate generalized atrophy and chronic microvascular disease, no acute changes.  PAST MEDICAL HISTORY: Past Medical History  Diagnosis Date  . Hypertension   . Hypercholesteremia   . Breast cancer   . NSTEMI (non-ST elevated myocardial infarction)     in setting of low Na:  Echo 7/22:  EF 60-65%, normal wall motion, mild LVH, grade 1 diast dysfxn, PASP 29.;  Myoview:  EF 85% and no ischemia  . SIADH (syndrome of inappropriate ADH production)   . Arthritis   . Personal history of  adenomatouscolonic polyps 04/22/2012    2 diminutive adenomas 12/2006 [name])  . Hx of echocardiogram     Echo (9/14):  Mild focal basal septal hypertrophy, EF 55-60%, normal wall motion, Gr 1 DD     PAST SURGICAL HISTORY: Past Surgical History  Procedure Laterality Date  . Breast lumpectomy    . Cataract extraction w/ intraocular lens  implant, bilateral  2011  . Colonoscopy      MEDICATIONS: Current Outpatient Prescriptions on File Prior to Visit  Medication Sig Dispense Refill  . aspirin 81 MG tablet Take 81 mg by mouth daily.      . Calcium Carbonate-Vitamin D (CALTRATE 600+D) 600-400 MG-UNIT per chew  tablet Chew 1 tablet by mouth daily.      . fish oil-omega-3 fatty acids 1000 MG capsule Take 1 g by mouth daily.      . Multiple Vitamin (MULTIVITAMIN) tablet Take 1 tablet by mouth daily.       No current facility-administered medications on file prior to visit.    ALLERGIES: Allergies  Allergen Reactions  . Other     hmg coa redustase inhibitors - muscle  aches  . Statins Other (See Comments)    Muscle aches     FAMILY HISTORY: Family History  Problem Relation Age of Onset  . Diabetes Father   . Hypertension Mother   . Stroke Father     SOCIAL HISTORY: History   Social History  . Marital Status: Married    Spouse Name: N/A  . Number of Children: 3  . Years of Education: N/A   Occupational History  . Retired    Social History Main Topics  . Smoking status: Former Smoker    Types: Cigarettes  . Smokeless tobacco: Never Used     Comment: quit 1969  . Alcohol Use: 0.0 oz/week    0 Standard drinks or equivalent per week     Comment: occ wine  . Drug Use: No  . Sexual Activity: Not on file   Other Topics Concern  . Not on file   Social History Narrative    REVIEW OF SYSTEMS: Constitutional: No fevers, chills, or sweats, no generalized fatigue, change in appetite Eyes: No visual changes, double vision, eye pain Ear, nose and throat: No hearing loss, ear pain, nasal congestion, sore throat Cardiovascular: No chest pain, palpitations Respiratory:  No shortness of breath at rest or with exertion, wheezes GastrointestinaI: No nausea, vomiting, diarrhea, abdominal pain, fecal incontinence Genitourinary:  No dysuria, urinary retention or frequency Musculoskeletal:  No neck pain, back pain Integumentary: No rash, pruritus, skin lesions Neurological: as above Psychiatric: No depression, insomnia, anxiety Endocrine: No palpitations, fatigue, diaphoresis, mood swings, change in appetite, change in weight, increased thirst Hematologic/Lymphatic:  No anemia, purpura, petechiae. Allergic/Immunologic: no itchy/runny eyes, nasal congestion, recent allergic reactions, rashes  PHYSICAL EXAM: Filed Vitals:   07/07/15 1103  BP: 118/82  Pulse: 66  Resp: 16   General: No acute distress Head:  Normocephalic/atraumatic Eyes: Fundoscopic exam shows bilateral sharp discs, no vessel changes, exudates, or hemorrhages Neck: supple,  no paraspinal tenderness, full range of motion Back: No paraspinal tenderness Heart: regular rate and rhythm Lungs: Clear to auscultation bilaterally. Vascular: No carotid bruits. Skin/Extremities: No rash, no edema Neurological Exam: Mental status: alert and oriented to person, place, and time, no dysarthria or aphasia, Fund of knowledge is appropriate.  Recent and remote memory are intact.  Attention and concentration are normal.    Able to name objects and repeat phrases. Clock drawing 5/5 MMSE - Mini Mental State Exam 07/07/2015  Orientation to time 5  Orientation to Place 4  Registration 3  Attention/ Calculation 5  Recall 1  Language- name 2 objects 2  Language- repeat 1  Language- follow 3 step command 3  Language- read & follow direction 1  Write a sentence 1  Copy design 1  Total score 27   Cranial nerves: CN I: not tested CN II: pupils equal, round and reactive to light, visual fields intact, fundi unremarkable. CN III, IV, VI:  full range of motion, no nystagmus, no ptosis CN V: facial sensation intact CN VII: upper and lower face symmetric CN VIII: hearing  intact to finger rub CN IX, X: gag intact, uvula midline CN XI: sternocleidomastoid and trapezius muscles intact CN XII: tongue midline Bulk & Tone: normal, no cogwheeling,no fasciculations. Motor: 5/5 throughout with no pronator drift. Sensation: intact to light touch, cold, pin, vibration and joint position sense.  No extinction to double simultaneous stimulation.  Romberg test negative Deep Tendon Reflexes: +2 throughout, no ankle clonus Plantar responses: downgoing bilaterally Cerebellar: no incoordination on finger to nose, heel to shin. No dysdiadochokinesia Gait: narrow-based and steady, able to tandem walk adequately. Tremor: mild bilateral low amplitude, high frequency postural and action tremor, no resting tremor  IMPRESSION: This is a pleasant 81 year old right-handed woman with a history of  hypertension, hyperlipidemia, NSTEMI, breast cancer, presenting for memory loss. MMSE today 27/30, indicating mild cognitive impairment. Mild cognitive impairment (MCI) means there are serious cognitive problems by report and testing but the patient is functioning normally. Around 50% of MCI patients progress to dementia (functional impairment) over 5 years. Dementia implies that ADLs are currently compromised. We discussed this in the office today. Her husband expressed concern about the calls he gets when driving, and she became slightly upset. We discussed that if there are family concerns in patients with MCI, recommendation is a driving assessment. Continue Aricept 10mg , she denies any loose stools at this time. We discussed expectations from the medication. We discussed the importance of physical exercise and brain stimulation exercises for brain health. She will follow-up in 1 year.  Thank you for allowing me to participate in the care of this patient. Please do not hesitate to call for any questions or concerns.   Ellouise Newer, M.D.  CC: Dr. Ernie Hew

## 2015-08-03 ENCOUNTER — Ambulatory Visit: Payer: Medicare Other | Admitting: Neurology

## 2015-08-15 DIAGNOSIS — C4492 Squamous cell carcinoma of skin, unspecified: Secondary | ICD-10-CM | POA: Diagnosis not present

## 2015-08-15 DIAGNOSIS — C44310 Basal cell carcinoma of skin of unspecified parts of face: Secondary | ICD-10-CM | POA: Diagnosis not present

## 2015-08-15 DIAGNOSIS — R413 Other amnesia: Secondary | ICD-10-CM | POA: Diagnosis not present

## 2015-08-15 DIAGNOSIS — Z23 Encounter for immunization: Secondary | ICD-10-CM | POA: Diagnosis not present

## 2015-08-16 DIAGNOSIS — C44319 Basal cell carcinoma of skin of other parts of face: Secondary | ICD-10-CM | POA: Diagnosis not present

## 2015-08-16 DIAGNOSIS — D485 Neoplasm of uncertain behavior of skin: Secondary | ICD-10-CM | POA: Diagnosis not present

## 2015-09-01 DIAGNOSIS — D0430 Carcinoma in situ of skin of unspecified part of face: Secondary | ICD-10-CM | POA: Diagnosis not present

## 2015-11-09 DIAGNOSIS — C44319 Basal cell carcinoma of skin of other parts of face: Secondary | ICD-10-CM | POA: Diagnosis not present

## 2015-11-09 DIAGNOSIS — C44310 Basal cell carcinoma of skin of unspecified parts of face: Secondary | ICD-10-CM | POA: Diagnosis not present

## 2015-11-16 DIAGNOSIS — Z4802 Encounter for removal of sutures: Secondary | ICD-10-CM | POA: Diagnosis not present

## 2016-02-09 DIAGNOSIS — I10 Essential (primary) hypertension: Secondary | ICD-10-CM | POA: Diagnosis not present

## 2016-02-09 DIAGNOSIS — R5383 Other fatigue: Secondary | ICD-10-CM | POA: Diagnosis not present

## 2016-02-09 DIAGNOSIS — C4492 Squamous cell carcinoma of skin, unspecified: Secondary | ICD-10-CM | POA: Diagnosis not present

## 2016-02-09 DIAGNOSIS — C44310 Basal cell carcinoma of skin of unspecified parts of face: Secondary | ICD-10-CM | POA: Diagnosis not present

## 2016-02-10 DIAGNOSIS — M25552 Pain in left hip: Secondary | ICD-10-CM | POA: Diagnosis not present

## 2016-02-10 DIAGNOSIS — R413 Other amnesia: Secondary | ICD-10-CM | POA: Diagnosis not present

## 2016-02-10 DIAGNOSIS — I10 Essential (primary) hypertension: Secondary | ICD-10-CM | POA: Diagnosis not present

## 2016-04-17 DIAGNOSIS — R070 Pain in throat: Secondary | ICD-10-CM | POA: Diagnosis not present

## 2016-04-17 DIAGNOSIS — H6121 Impacted cerumen, right ear: Secondary | ICD-10-CM | POA: Diagnosis not present

## 2016-04-17 DIAGNOSIS — R05 Cough: Secondary | ICD-10-CM | POA: Diagnosis not present

## 2016-04-20 ENCOUNTER — Encounter (HOSPITAL_BASED_OUTPATIENT_CLINIC_OR_DEPARTMENT_OTHER): Payer: Self-pay | Admitting: *Deleted

## 2016-04-20 ENCOUNTER — Emergency Department (HOSPITAL_BASED_OUTPATIENT_CLINIC_OR_DEPARTMENT_OTHER): Payer: Medicare Other

## 2016-04-20 ENCOUNTER — Emergency Department (HOSPITAL_BASED_OUTPATIENT_CLINIC_OR_DEPARTMENT_OTHER)
Admission: EM | Admit: 2016-04-20 | Discharge: 2016-04-20 | Disposition: A | Discharge status: Home / Self Care | Payer: Medicare Other | Attending: Emergency Medicine | Admitting: Emergency Medicine

## 2016-04-20 DIAGNOSIS — M1990 Unspecified osteoarthritis, unspecified site: Secondary | ICD-10-CM | POA: Insufficient documentation

## 2016-04-20 DIAGNOSIS — Z79899 Other long term (current) drug therapy: Secondary | ICD-10-CM | POA: Insufficient documentation

## 2016-04-20 DIAGNOSIS — Z87891 Personal history of nicotine dependence: Secondary | ICD-10-CM | POA: Diagnosis not present

## 2016-04-20 DIAGNOSIS — I10 Essential (primary) hypertension: Secondary | ICD-10-CM | POA: Insufficient documentation

## 2016-04-20 DIAGNOSIS — E871 Hypo-osmolality and hyponatremia: Secondary | ICD-10-CM | POA: Diagnosis not present

## 2016-04-20 DIAGNOSIS — J40 Bronchitis, not specified as acute or chronic: Secondary | ICD-10-CM

## 2016-04-20 DIAGNOSIS — Z853 Personal history of malignant neoplasm of breast: Secondary | ICD-10-CM | POA: Diagnosis not present

## 2016-04-20 DIAGNOSIS — I214 Non-ST elevation (NSTEMI) myocardial infarction: Secondary | ICD-10-CM | POA: Diagnosis not present

## 2016-04-20 DIAGNOSIS — J029 Acute pharyngitis, unspecified: Secondary | ICD-10-CM | POA: Insufficient documentation

## 2016-04-20 DIAGNOSIS — E7800 Pure hypercholesterolemia, unspecified: Secondary | ICD-10-CM | POA: Diagnosis not present

## 2016-04-20 DIAGNOSIS — R05 Cough: Secondary | ICD-10-CM | POA: Diagnosis not present

## 2016-04-20 LAB — BASIC METABOLIC PANEL
Anion gap: 7 (ref 5–15)
BUN: 15 mg/dL (ref 6–20)
CALCIUM: 8.9 mg/dL (ref 8.9–10.3)
CO2: 25 mmol/L (ref 22–32)
CREATININE: 0.73 mg/dL (ref 0.44–1.00)
Chloride: 99 mmol/L — ABNORMAL LOW (ref 101–111)
GFR calc Af Amer: >60 mL/min (ref >60)
GFR calc non Af Amer: >60 mL/min (ref >60)
GLUCOSE: 115 mg/dL — AB (ref 65–99)
Potassium: 3.9 mmol/L (ref 3.5–5.1)
Sodium: 131 mmol/L — ABNORMAL LOW (ref 135–145)

## 2016-04-20 LAB — COMPREHENSIVE METABOLIC PANEL
ALBUMIN: 4.1 g/dL (ref 3.5–5.0)
ALK PHOS: 73 U/L (ref 38–126)
ALT: 20 U/L (ref 14–54)
AST: 27 U/L (ref 15–41)
Anion gap: 11 (ref 5–15)
BUN: 18 mg/dL (ref 6–20)
CALCIUM: 9.8 mg/dL (ref 8.9–10.3)
CO2: 26 mmol/L (ref 22–32)
CREATININE: 0.83 mg/dL (ref 0.44–1.00)
Chloride: 91 mmol/L — ABNORMAL LOW (ref 101–111)
GFR calc non Af Amer: >60 mL/min (ref >60)
GLUCOSE: 111 mg/dL — AB (ref 65–99)
Potassium: 4.3 mmol/L (ref 3.5–5.1)
SODIUM: 128 mmol/L — AB (ref 135–145)
Total Bilirubin: 0.7 mg/dL (ref 0.3–1.2)
Total Protein: 7.5 g/dL (ref 6.5–8.1)

## 2016-04-20 LAB — CBC WITH DIFFERENTIAL/PLATELET
Basophils Absolute: 0 10*3/uL (ref 0.0–0.1)
Basophils Relative: 0 %
EOS ABS: 0.1 10*3/uL (ref 0.0–0.7)
Eosinophils Relative: 1 %
HCT: 43.4 % (ref 36.0–46.0)
HEMOGLOBIN: 15 g/dL (ref 12.0–15.0)
LYMPHS ABS: 1.5 10*3/uL (ref 0.7–4.0)
Lymphocytes Relative: 14 %
MCH: 31.2 pg (ref 26.0–34.0)
MCHC: 34.6 g/dL (ref 30.0–36.0)
MCV: 90.2 fL (ref 78.0–100.0)
Monocytes Absolute: 1.2 10*3/uL — ABNORMAL HIGH (ref 0.1–1.0)
Monocytes Relative: 11 %
NEUTROS PCT: 74 %
Neutro Abs: 7.9 10*3/uL — ABNORMAL HIGH (ref 1.7–7.7)
Platelets: 313 10*3/uL (ref 150–400)
RBC: 4.81 MIL/uL (ref 3.87–5.11)
RDW: 13 % (ref 11.5–15.5)
WBC: 10.8 10*3/uL — AB (ref 4.0–10.5)

## 2016-04-20 MED ORDER — DM-GUAIFENESIN ER 30-600 MG PO TB12: 1.0000 | ORAL_TABLET | Freq: Two times a day (BID) | ORAL | Status: DC

## 2016-04-20 MED ORDER — SODIUM CHLORIDE 0.9 % IV SOLN: INTRAVENOUS | Status: DC

## 2016-04-20 MED ORDER — SODIUM CHLORIDE 0.9 % IV SOLN
INTRAVENOUS | Status: DC
  Administered 2016-04-20: 16:00:00 via INTRAVENOUS

## 2016-04-20 MED ORDER — SODIUM CHLORIDE 0.9 % IV BOLUS (SEPSIS): 250.0000 mL | Freq: Once | INTRAVENOUS | Status: DC

## 2016-04-20 MED ORDER — SODIUM CHLORIDE 0.9 % IV BOLUS (SEPSIS)
500.0000 mL | Freq: Once | INTRAVENOUS | Status: AC
  Administered 2016-04-20: 500 mL via INTRAVENOUS

## 2016-04-20 MED ORDER — ALBUTEROL SULFATE HFA 108 (90 BASE) MCG/ACT IN AERS: 2.0000 | INHALATION_SPRAY | Freq: Four times a day (QID) | RESPIRATORY_TRACT | Status: DC | PRN

## 2016-04-20 NOTE — Discharge Instructions (Signed)
How to Use an Inhaler Using your inhaler correctly is very important. Good technique will make sure that the medicine reaches your lungs.  HOW TO USE AN INHALER:  Take the cap off the inhaler.  If this is the first time using your inhaler, you need to prime it. Shake the inhaler for 5 seconds. Release four puffs into the air, away from your face. Ask your doctor for help if you have questions.  Shake the inhaler for 5 seconds.  Turn the inhaler so the bottle is above the mouthpiece.  Put your pointer finger on top of the bottle. Your thumb holds the bottom of the inhaler.  Open your mouth.  Either hold the inhaler away from your mouth (the width of 2 fingers) or place your lips tightly around the mouthpiece. Ask your doctor which way to use your inhaler.  Breathe out as much air as possible.  Breathe in and push down on the bottle 1 time to release the medicine. You will feel the medicine go in your mouth and throat.  Continue to take a deep breath in very slowly. Try to fill your lungs.  After you have breathed in completely, hold your breath for 10 seconds. This will help the medicine to settle in your lungs. If you cannot hold your breath for 10 seconds, hold it for as long as you can before you breathe out.  Breathe out slowly, through pursed lips. Whistling is an example of pursed lips.  If your doctor has told you to take more than 1 puff, wait at least 15-30 seconds between puffs. This will help you get the best results from your medicine. Do not use the inhaler more than your doctor tells you to.  Put the cap back on the inhaler.  Follow the directions from your doctor or from the inhaler package about cleaning the inhaler. If you use more than one inhaler, ask your doctor which inhalers to use and what order to use them in. Ask your doctor to help you figure out when you will need to refill your inhaler.  If you use a steroid inhaler, always rinse your mouth with water  after your last puff, gargle and spit out the water. Do not swallow the water. GET HELP IF:  The inhaler medicine only partially helps to stop wheezing or shortness of breath.  You are having trouble using your inhaler.  You have some increase in thick spit (phlegm). GET HELP RIGHT AWAY IF:  The inhaler medicine does not help your wheezing or shortness of breath or you have tightness in your chest.  You have dizziness, headaches, or fast heart rate.  You have chills, fever, or night sweats.  You have a large increase of thick spit, or your thick spit is bloody. MAKE SURE YOU:   Understand these instructions.  Will watch your condition.  Will get help right away if you are not doing well or get worse.   This information is not intended to replace advice given to you by your health care provider. Make sure you discuss any questions you have with your health care provider.   Document Released: 08/28/2008 Document Revised: 09/09/2013 Document Reviewed: 06/18/2013 Elsevier Interactive Patient Education 2016 [place].  Hyponatremia Hyponatremia is when the amount of salt (sodium) in your blood is too low. When salt levels are low, your cells absorb extra water and they swell. The swelling happens throughout the body, but it mostly affects the brain.  HOME CARE  Take medicines only as told by your doctor. Many medicines can make this condition worse. Talk with your doctor about any medicines that you are currently taking.  Carefully follow a recommended diet as told by your doctor.  Carefully follow instructions from your doctor about fluid restrictions.  Keep all follow-up visits as told by your doctor. This is important.  Do not drink alcohol. GET HELP IF:  You feel sicker to your stomach (nauseous).  You feel more confused.  You feel more tired (fatigued).  Your headache gets worse.  You feel weaker.  Your symptoms go away and then they come back.  You have  trouble following the diet instructions. GET HELP RIGHT AWAY IF:  You start to twitch and shake (have a seizure).  You pass out (faint).  You keep having watery poop (diarrhea).  You keep throwing up (vomiting).   This information is not intended to replace advice given to you by your health care provider. Make sure you discuss any questions you have with your health care provider.   Make an appointment with your regular doctor to follow-up on the sodium level sometime next week. Use albuterol inhaler 2 puffs every 6 hours for the next 7 days. Take Mucinex DM for the next 7 days. Return for any new or worse symptoms.   Document Released: 08/01/2011 Document Revised: 04/05/2015 Document Reviewed: 11/15/2014 Elsevier Interactive Patient Education [employer].

## 2016-04-20 NOTE — ED Provider Notes (Signed)
CSN: [id number]     Arrival date & time 04/20/16  1352 History   First MD Initiated Contact with Patient 04/20/16 1515     Chief Complaint  Patient presents with  . Cough     (Consider location/radiation/quality/duration/timing/severity/associated sxs/prior Treatment) Patient is a 82 y.o. female presenting with cough. The history is provided by the patient and the spouse.  Cough Associated symptoms: sore throat   Associated symptoms: no chest pain, no fever, no headaches, no myalgias and no rash   Patient has a history of some dementia. Most history provided by her husband. Patient's had a cough and congestion mostly dry but occasionally productive for the past 10 days. No fever no nausea vomiting or diarrhea. Seen by primary care doctor on Tuesday that they thought they were allergy symptoms. He did with a nose spray. The patient still with persistent symptoms.  Past Medical History  Diagnosis Date  . Hypertension   . Hypercholesteremia   . Breast cancer ([place])   . NSTEMI (non-ST elevated myocardial infarction) ([place])     in setting of low Na:  Echo 7/22:  EF 60-65%, normal wall motion, mild LVH, grade 1 diast dysfxn, PASP 29.;  Myoview:  EF 85% and no ischemia  . SIADH (syndrome of inappropriate ADH production) ([place])   . Arthritis   . Personal history of  adenomatouscolonic polyps 04/22/2012    2 diminutive adenomas 12/2006 [name])  . Hx of echocardiogram     Echo (9/14):  Mild focal basal septal hypertrophy, EF 55-60%, normal wall motion, Gr 1 DD    Past Surgical History  Procedure Laterality Date  . Breast lumpectomy    . Cataract extraction w/ intraocular lens  implant, bilateral  2011  . Colonoscopy     Family History  Problem Relation Age of Onset  . Diabetes Father   . Hypertension Mother   . Stroke Father    Social History  Substance Use Topics  . Smoking status: Former Smoker    Types: Cigarettes  . Smokeless tobacco: Never Used     Comment: quit 1969  .  Alcohol Use: 0.0 oz/week    0 Standard drinks or equivalent per week     Comment: occ wine   OB History    No data available     Review of Systems  Constitutional: Positive for fatigue. Negative for fever.  HENT: Positive for congestion and sore throat.   Eyes: Negative for redness and visual disturbance.  Respiratory: Positive for cough.   Cardiovascular: Negative for chest pain.  Gastrointestinal: Negative for nausea, vomiting, abdominal pain and diarrhea.  Genitourinary: Negative for dysuria.  Musculoskeletal: Negative for myalgias.  Skin: Negative for rash.  Neurological: Negative for headaches.  Hematological: Does not bruise/bleed easily.  Psychiatric/Behavioral: Positive for confusion.      Allergies  Other and Statins  Home Medications   Prior to Admission medications   Medication Sig Start Date End Date Taking? Authorizing Provider  azelastine (ASTELIN) 0.1 % nasal spray Place into both nostrils 2 (two) times daily. Use in each nostril as directed   Yes Historical Provider, MD  Loratadine (CLARITIN PO) Take by mouth.   Yes Historical Provider, MD  albuterol (PROVENTIL HFA;VENTOLIN HFA) 108 (90 Base) MCG/ACT inhaler Inhale 2 puffs into the lungs every 6 (six) hours as needed for wheezing or shortness of breath. 04/20/16   Fredia Sorrow, MD  aspirin 81 MG tablet Take 81 mg by mouth daily.      Historical Provider,  MD  Calcium Carbonate-Vitamin D (CALTRATE 600+D) 600-400 MG-UNIT per chew tablet Chew 1 tablet by mouth daily.      Historical Provider, MD  dextromethorphan-guaiFENesin (MUCINEX DM) 30-600 MG 12hr tablet Take 1 tablet by mouth 2 (two) times daily. 04/20/16   Fredia Sorrow, MD  donepezil (ARICEPT) 5 MG tablet Take 2 tablets at bedtime    Historical Provider, MD  fish oil-omega-3 fatty acids 1000 MG capsule Take 1 g by mouth daily.      Historical Provider, MD  lisinopril (PRINIVIL,ZESTRIL) 5 MG tablet Take 5 mg by mouth daily.    Historical Provider, MD   Multiple Vitamin (MULTIVITAMIN) tablet Take 1 tablet by mouth daily.      Historical Provider, MD   BP 153/79 mmHg  Pulse 63  Temp(Src) 97.6 F (36.4 C) (Oral)  Resp 16  Ht 5' 3.5" (1.613 m)  Wt 63.504 kg  BMI 24.41 kg/m2  SpO2 98% Physical Exam  Constitutional: She appears well-developed and well-nourished. No distress.  HENT:  Head: Normocephalic and atraumatic.  Mouth/Throat: Oropharynx is clear and moist. No oropharyngeal exudate.  Eyes: Conjunctivae and EOM are normal. Pupils are equal, round, and reactive to light.  Neck: Normal range of motion. Neck supple.  Cardiovascular: Normal rate, regular rhythm and normal heart sounds.   No murmur heard. Pulmonary/Chest: Effort normal and breath sounds normal. No respiratory distress. She has no wheezes. She has no rales.  Abdominal: Soft. Bowel sounds are normal. There is no tenderness.  Musculoskeletal: Normal range of motion. She exhibits no edema.  Neurological: She is alert. No cranial nerve deficit. She exhibits normal muscle tone. Coordination normal.  Skin: Skin is warm. No rash noted.  Nursing note and vitals reviewed.   ED Course  Procedures (including critical care time) Labs Review Labs Reviewed  CBC WITH DIFFERENTIAL/PLATELET - Abnormal; Notable for the following:    WBC 10.8 (*)    Neutro Abs 7.9 (*)    Monocytes Absolute 1.2 (*)    All other components within normal limits  COMPREHENSIVE METABOLIC PANEL - Abnormal; Notable for the following:    Sodium 128 (*)    Chloride 91 (*)    Glucose, Bld 111 (*)    All other components within normal limits  BASIC METABOLIC PANEL - Abnormal; Notable for the following:    Sodium 131 (*)    Chloride 99 (*)    Glucose, Bld 115 (*)    All other components within normal limits   Results for orders placed or performed during the hospital encounter of 04/20/16  CBC with Differential/Platelet  Result Value Ref Range   WBC 10.8 (H) 4.0 - 10.5 K/uL   RBC 4.81 3.87 - 5.11  MIL/uL   Hemoglobin 15.0 12.0 - 15.0 g/dL   HCT 43.4 36.0 - 46.0 %   MCV 90.2 78.0 - 100.0 fL   MCH 31.2 26.0 - 34.0 pg   MCHC 34.6 30.0 - 36.0 g/dL   RDW 13.0 11.5 - 15.5 %   Platelets 313 150 - 400 K/uL   Neutrophils Relative % 74 %   Neutro Abs 7.9 (H) 1.7 - 7.7 K/uL   Lymphocytes Relative 14 %   Lymphs Abs 1.5 0.7 - 4.0 K/uL   Monocytes Relative 11 %   Monocytes Absolute 1.2 (H) 0.1 - 1.0 K/uL   Eosinophils Relative 1 %   Eosinophils Absolute 0.1 0.0 - 0.7 K/uL   Basophils Relative 0 %   Basophils Absolute 0.0 0.0 - 0.1 K/uL  Comprehensive metabolic panel  Result Value Ref Range   Sodium 128 (L) 135 - 145 mmol/L   Potassium 4.3 3.5 - 5.1 mmol/L   Chloride 91 (L) 101 - 111 mmol/L   CO2 26 22 - 32 mmol/L   Glucose, Bld 111 (H) 65 - 99 mg/dL   BUN 18 6 - 20 mg/dL   Creatinine, Ser 0.83 0.44 - 1.00 mg/dL   Calcium 9.8 8.9 - 10.3 mg/dL   Total Protein 7.5 6.5 - 8.1 g/dL   Albumin 4.1 3.5 - 5.0 g/dL   AST 27 15 - 41 U/L   ALT 20 14 - 54 U/L   Alkaline Phosphatase 73 38 - 126 U/L   Total Bilirubin 0.7 0.3 - 1.2 mg/dL   GFR calc non Af Amer >60 >60 mL/min   GFR calc Af Amer >60 >60 mL/min   Anion gap 11 5 - 15  Basic metabolic panel  Result Value Ref Range   Sodium 131 (L) 135 - 145 mmol/L   Potassium 3.9 3.5 - 5.1 mmol/L   Chloride 99 (L) 101 - 111 mmol/L   CO2 25 22 - 32 mmol/L   Glucose, Bld 115 (H) 65 - 99 mg/dL   BUN 15 6 - 20 mg/dL   Creatinine, Ser 0.73 0.44 - 1.00 mg/dL   Calcium 8.9 8.9 - 10.3 mg/dL   GFR calc non Af Amer >60 >60 mL/min   GFR calc Af Amer >60 >60 mL/min   Anion gap 7 5 - 15     Imaging Review Dg Chest 2 View  04/20/2016  CLINICAL DATA:  Cough for over a week, sore throat, hypertension, breast cancer post lumpectomy, former smoker EXAM: CHEST  2 VIEW COMPARISON:  08/05/2013 FINDINGS: Normal heart size, mediastinal contours, and pulmonary vascularity. Lungs hyperinflated but clear. No infiltrate, pleural effusion or pneumothorax. Bones  demineralized Post LEFT breast surgery and question axillary node dissection. IMPRESSION: Emphysematous changes without acute infiltrate. Electronically Signed   By: Lavonia Dana M.D.   On: 04/20/2016 14:21   I have personally reviewed and evaluated these images and lab results as part of my medical decision-making.   EKG Interpretation None      MDM   Final diagnoses:  Bronchitis  Hyponatremia   Patient's symptoms consistent with an upper respiratory infection bronchitis. Chest x-ray negative for pneumonia. Did show some evidence of emphysema changes. Patient's had a history of hyponatremia in the past. Patient sodium here was 128. Not clinically dangerous. Patient received IV normal saline fluids repeat sodium had shown an increase to 131. Patient overall feeling better. Patient stable for discharge home and follow-up with her primary care doctor next week to have the sodium rechecked. We treated with albuterol inhaler and Mucinex DM for the bronchitis. Patient nontoxic no acute distress.    Fredia Sorrow, MD 04/20/16 2129

## 2016-04-20 NOTE — ED Notes (Signed)
Cough for over a week. Sore throat. No fever. Fatigue. She was seen by her MD a couple of days ago and was diagnosed with allergies.

## 2016-04-20 NOTE — ED Notes (Signed)
MD at bedside. 

## 2016-04-24 DIAGNOSIS — R5383 Other fatigue: Secondary | ICD-10-CM | POA: Diagnosis not present

## 2016-04-24 DIAGNOSIS — D649 Anemia, unspecified: Secondary | ICD-10-CM | POA: Diagnosis not present

## 2016-04-24 DIAGNOSIS — J40 Bronchitis, not specified as acute or chronic: Secondary | ICD-10-CM | POA: Diagnosis not present

## 2016-04-24 DIAGNOSIS — E871 Hypo-osmolality and hyponatremia: Secondary | ICD-10-CM | POA: Diagnosis not present

## 2016-04-27 DIAGNOSIS — J40 Bronchitis, not specified as acute or chronic: Secondary | ICD-10-CM | POA: Diagnosis not present

## 2016-04-27 DIAGNOSIS — R5383 Other fatigue: Secondary | ICD-10-CM | POA: Diagnosis not present

## 2016-04-27 DIAGNOSIS — E871 Hypo-osmolality and hyponatremia: Secondary | ICD-10-CM | POA: Diagnosis not present

## 2016-05-04 ENCOUNTER — Emergency Department (HOSPITAL_BASED_OUTPATIENT_CLINIC_OR_DEPARTMENT_OTHER): Payer: Medicare Other

## 2016-05-04 ENCOUNTER — Inpatient Hospital Stay (HOSPITAL_BASED_OUTPATIENT_CLINIC_OR_DEPARTMENT_OTHER)
Admission: EM | Admit: 2016-05-04 | Discharge: 2016-05-15 | DRG: 643 | Disposition: A | Discharge status: Skilled Nursing Facility | Payer: Medicare Other | Attending: Internal Medicine | Admitting: Internal Medicine

## 2016-05-04 ENCOUNTER — Encounter (HOSPITAL_BASED_OUTPATIENT_CLINIC_OR_DEPARTMENT_OTHER): Payer: Self-pay

## 2016-05-04 DIAGNOSIS — J9811 Atelectasis: Secondary | ICD-10-CM | POA: Diagnosis not present

## 2016-05-04 DIAGNOSIS — E871 Hypo-osmolality and hyponatremia: Secondary | ICD-10-CM | POA: Diagnosis present

## 2016-05-04 DIAGNOSIS — I10 Essential (primary) hypertension: Secondary | ICD-10-CM | POA: Diagnosis not present

## 2016-05-04 DIAGNOSIS — I110 Hypertensive heart disease with heart failure: Secondary | ICD-10-CM | POA: Diagnosis present

## 2016-05-04 DIAGNOSIS — G3184 Mild cognitive impairment, so stated: Secondary | ICD-10-CM | POA: Diagnosis present

## 2016-05-04 DIAGNOSIS — Z7982 Long term (current) use of aspirin: Secondary | ICD-10-CM

## 2016-05-04 DIAGNOSIS — R0989 Other specified symptoms and signs involving the circulatory and respiratory systems: Secondary | ICD-10-CM | POA: Diagnosis not present

## 2016-05-04 DIAGNOSIS — M6281 Muscle weakness (generalized): Secondary | ICD-10-CM | POA: Diagnosis not present

## 2016-05-04 DIAGNOSIS — R41841 Cognitive communication deficit: Secondary | ICD-10-CM | POA: Diagnosis not present

## 2016-05-04 DIAGNOSIS — Z9181 History of falling: Secondary | ICD-10-CM | POA: Diagnosis not present

## 2016-05-04 DIAGNOSIS — F0390 Unspecified dementia without behavioral disturbance: Secondary | ICD-10-CM | POA: Diagnosis present

## 2016-05-04 DIAGNOSIS — R531 Weakness: Secondary | ICD-10-CM | POA: Diagnosis not present

## 2016-05-04 DIAGNOSIS — E222 Syndrome of inappropriate secretion of antidiuretic hormone: Secondary | ICD-10-CM | POA: Diagnosis not present

## 2016-05-04 DIAGNOSIS — I5033 Acute on chronic diastolic (congestive) heart failure: Secondary | ICD-10-CM | POA: Diagnosis present

## 2016-05-04 DIAGNOSIS — Z452 Encounter for adjustment and management of vascular access device: Secondary | ICD-10-CM | POA: Diagnosis not present

## 2016-05-04 DIAGNOSIS — S0083XA Contusion of other part of head, initial encounter: Secondary | ICD-10-CM | POA: Diagnosis not present

## 2016-05-04 DIAGNOSIS — E870 Hyperosmolality and hypernatremia: Secondary | ICD-10-CM | POA: Diagnosis not present

## 2016-05-04 DIAGNOSIS — Z888 Allergy status to other drugs, medicaments and biological substances status: Secondary | ICD-10-CM

## 2016-05-04 DIAGNOSIS — N390 Urinary tract infection, site not specified: Secondary | ICD-10-CM | POA: Diagnosis present

## 2016-05-04 DIAGNOSIS — R05 Cough: Secondary | ICD-10-CM | POA: Diagnosis not present

## 2016-05-04 DIAGNOSIS — E7800 Pure hypercholesterolemia, unspecified: Secondary | ICD-10-CM | POA: Diagnosis present

## 2016-05-04 DIAGNOSIS — I951 Orthostatic hypotension: Secondary | ICD-10-CM | POA: Diagnosis present

## 2016-05-04 DIAGNOSIS — Z5189 Encounter for other specified aftercare: Secondary | ICD-10-CM | POA: Diagnosis not present

## 2016-05-04 DIAGNOSIS — I5031 Acute diastolic (congestive) heart failure: Secondary | ICD-10-CM | POA: Diagnosis not present

## 2016-05-04 DIAGNOSIS — R5383 Other fatigue: Secondary | ICD-10-CM | POA: Diagnosis not present

## 2016-05-04 DIAGNOSIS — J811 Chronic pulmonary edema: Secondary | ICD-10-CM | POA: Diagnosis not present

## 2016-05-04 DIAGNOSIS — E876 Hypokalemia: Secondary | ICD-10-CM | POA: Diagnosis present

## 2016-05-04 DIAGNOSIS — R2681 Unsteadiness on feet: Secondary | ICD-10-CM | POA: Diagnosis not present

## 2016-05-04 DIAGNOSIS — R259 Unspecified abnormal involuntary movements: Secondary | ICD-10-CM | POA: Diagnosis not present

## 2016-05-04 LAB — CBC WITH DIFFERENTIAL/PLATELET
BASOS ABS: 0 10*3/uL (ref 0.0–0.1)
BASOS PCT: 0 %
Eosinophils Absolute: 0 10*3/uL (ref 0.0–0.7)
Eosinophils Relative: 0 %
HEMATOCRIT: 37.6 % (ref 36.0–46.0)
HEMOGLOBIN: 12.9 g/dL (ref 12.0–15.0)
LYMPHS PCT: 4 %
Lymphs Abs: 0.6 10*3/uL — ABNORMAL LOW (ref 0.7–4.0)
MCH: 31.6 pg (ref 26.0–34.0)
MCHC: 34.3 g/dL (ref 30.0–36.0)
MCV: 92.2 fL (ref 78.0–100.0)
Monocytes Absolute: 0.7 10*3/uL (ref 0.1–1.0)
Monocytes Relative: 5 %
NEUTROS ABS: 12.8 10*3/uL — AB (ref 1.7–7.7)
NEUTROS PCT: 91 %
Platelets: 254 10*3/uL (ref 150–400)
RBC: 4.08 MIL/uL (ref 3.87–5.11)
RDW: 13.3 % (ref 11.5–15.5)
WBC: 14.1 10*3/uL — AB (ref 4.0–10.5)

## 2016-05-04 LAB — URINALYSIS, ROUTINE W REFLEX MICROSCOPIC
Bilirubin Urine: NEGATIVE
GLUCOSE, UA: 100 mg/dL — AB
KETONES UR: NEGATIVE mg/dL
NITRITE: NEGATIVE
PH: 7.5 (ref 5.0–8.0)
PROTEIN: 30 mg/dL — AB
Specific Gravity, Urine: 1.016 (ref 1.005–1.030)

## 2016-05-04 LAB — BASIC METABOLIC PANEL
ANION GAP: 11 (ref 5–15)
BUN: 13 mg/dL (ref 6–20)
CALCIUM: 8.4 mg/dL — AB (ref 8.9–10.3)
CO2: 25 mmol/L (ref 22–32)
Chloride: 84 mmol/L — ABNORMAL LOW (ref 101–111)
Creatinine, Ser: 0.84 mg/dL (ref 0.44–1.00)
GFR calc non Af Amer: >60 mL/min (ref >60)
GLUCOSE: 146 mg/dL — AB (ref 65–99)
POTASSIUM: 4 mmol/L (ref 3.5–5.1)
Sodium: 120 mmol/L — ABNORMAL LOW (ref 135–145)

## 2016-05-04 LAB — URINE MICROSCOPIC-ADD ON

## 2016-05-04 MED ORDER — SODIUM CHLORIDE 0.9 % IV SOLN: Freq: Once | INTRAVENOUS | Status: DC

## 2016-05-04 MED ORDER — SODIUM CHLORIDE 0.9 % IV BOLUS (SEPSIS)
1000.0000 mL | Freq: Once | INTRAVENOUS | Status: AC
  Administered 2016-05-04: 1000 mL via INTRAVENOUS

## 2016-05-04 NOTE — ED Provider Notes (Signed)
CSN: [id number]     Arrival date & time 05/04/16  2040 History  By signing my name below, I, Georgette Shell, attest that this documentation has been prepared under the direction and in the presence of Merrily Pew, MD. Electronically Signed: Georgette Shell, ED Scribe. 05/04/2016. 10:10 PM.   Chief Complaint  Patient presents with  . Fall    The history is provided by the patient. No language interpreter was used.    HPI Comments: Carla Robles is a 82 y.o. female who presents to the Emergency Department for mechanical fall that occurred last night. Patient was moving around in her bedroom last night in the dark and walked into the doorframe, hitting her head in the process, and fell. No LOC. Husband did not see patient hit her face on the doorframe but heard her fall and went to help her up. Pt was ambulatory right after fall but needed some assistance with walking and went to bed immediately afterwards. Per husband, pt was not complaining of any pain s/p fall. When he awoke in the morning he noticed significant bruising to the right side of her face. He mentions that she has been experiencing fatigue for the past couple of weeks but was more weak today and was unable to ambulate without assistance. Patient states she currently feels tired but is otherwise asymptomatic. Husband also reports she hasn't slept today which is abnormal since she normally takes naps. Pt is found to be hypertensive in the ED which husband states is higher than normal. Her BP normally runs at 140/75. Pt takes 5 mg Lisinopril for HTN and has been complaint. Pt was seen in the ED on 04/20/2016 (approximately 2 weeks ago) for a cough and was found to be hyponatremic with a sodium level of 128. Pt was given saline solution in the ED which increased sodium is 131. She has been seen by her PCP twice since ED visit to check her sodium levels. Pt has had UTIs in the past but has not had them recently. Denies any other associated symptoms.   Past  Medical History  Diagnosis Date  . Hypertension   . Hypercholesteremia   . Breast cancer ([place])   . NSTEMI (non-ST elevated myocardial infarction) ([place])     in setting of low Na:  Echo 7/22:  EF 60-65%, normal wall motion, mild LVH, grade 1 diast dysfxn, PASP 29.;  Myoview:  EF 85% and no ischemia  . SIADH (syndrome of inappropriate ADH production) ([place])   . Arthritis   . Personal history of  adenomatouscolonic polyps 04/22/2012    2 diminutive adenomas 12/2006 [name])  . Hx of echocardiogram     Echo (9/14):  Mild focal basal septal hypertrophy, EF 55-60%, normal wall motion, Gr 1 DD    Past Surgical History  Procedure Laterality Date  . Breast lumpectomy    . Cataract extraction w/ intraocular lens  implant, bilateral  2011  . Colonoscopy     Family History  Problem Relation Age of Onset  . Diabetes Father   . Hypertension Mother   . Stroke Father    Social History  Substance Use Topics  . Smoking status: Former Smoker    Types: Cigarettes  . Smokeless tobacco: Never Used     Comment: quit 1969  . Alcohol Use: 0.0 oz/week    0 Standard drinks or equivalent per week     Comment: occ wine   OB History    No data available  Review of Systems  Constitutional: Positive for fatigue. Negative for fever.  Skin: Positive for color change (bruising).  Neurological: Positive for weakness (generalized). Negative for syncope.  All other systems reviewed and are negative.   Allergies  Other and Statins  Home Medications   Prior to Admission medications   Medication Sig Start Date End Date Taking? Authorizing Provider  albuterol (PROVENTIL HFA;VENTOLIN HFA) 108 (90 Base) MCG/ACT inhaler Inhale 2 puffs into the lungs every 6 (six) hours as needed for wheezing or shortness of breath. 04/20/16   Fredia Sorrow, MD  aspirin 81 MG tablet Take 81 mg by mouth daily.      Historical Provider, MD  azelastine (ASTELIN) 0.1 % nasal spray Place into both nostrils 2 (two) times daily.  Use in each nostril as directed    Historical Provider, MD  Calcium Carbonate-Vitamin D (CALTRATE 600+D) 600-400 MG-UNIT per chew tablet Chew 1 tablet by mouth daily.      Historical Provider, MD  dextromethorphan-guaiFENesin (MUCINEX DM) 30-600 MG 12hr tablet Take 1 tablet by mouth 2 (two) times daily. 04/20/16   Fredia Sorrow, MD  donepezil (ARICEPT) 5 MG tablet Take 2 tablets at bedtime    Historical Provider, MD  fish oil-omega-3 fatty acids 1000 MG capsule Take 1 g by mouth daily.      Historical Provider, MD  lisinopril (PRINIVIL,ZESTRIL) 5 MG tablet Take 5 mg by mouth daily.    Historical Provider, MD  Loratadine (CLARITIN PO) Take by mouth.    Historical Provider, MD  Multiple Vitamin (MULTIVITAMIN) tablet Take 1 tablet by mouth daily.      Historical Provider, MD   BP 173/91 mmHg  Pulse 86  Temp(Src) 98.5 F (36.9 C) (Oral)  Resp 18  Ht 5' 3.5" (1.613 m)  Wt 140 lb (63.504 kg)  BMI 24.41 kg/m2  SpO2 94% Physical Exam  Constitutional: She is oriented to person, place, and time. She appears well-developed and well-nourished.  HENT:  Head: Normocephalic.  Large ecchymosis on right forehead with significant TTP. No obvious deformities or step offs.  Eyes: Conjunctivae and EOM are normal. Pupils are equal, round, and reactive to light.  Ecchymosis around right eye. No proptosis. No pain with extraocular movements.   Cardiovascular: Normal rate, regular rhythm and normal heart sounds.   Pulmonary/Chest: Effort normal and breath sounds normal. No respiratory distress.  Abdominal: Soft. Bowel sounds are normal. She exhibits no distension.  Musculoskeletal: Normal range of motion.  No pain with ROM ankle, knees, hips, elbows, wrists, shoulders. Exam normal. No C,T, or L spine tenderness. no step offs or deformities.  Neurological: She is alert and oriented to person, place, and time.  Upper and lower extremities strength and sensation are normal. Cranial nerves intact. Dysmetria with  both hands with finger to nose.  Skin: Skin is warm and dry.  Psychiatric: She has a normal mood and affect. Her behavior is normal.  Nursing note and vitals reviewed.   ED Course  Procedures (including critical care time) DIAGNOSTIC STUDIES: Oxygen Saturation is 100% on RA, normal by my interpretation.    COORDINATION OF CARE: 9:58 PM Discussed treatment plan with pt at bedside which includes CBC, urinalysis, and CT Head and CT Maxillofacial and pt agreed to plan.  Labs Review Labs Reviewed  CBC WITH DIFFERENTIAL/PLATELET - Abnormal; Notable for the following:    WBC 14.1 (*)    Neutro Abs 12.8 (*)    Lymphs Abs 0.6 (*)    All other components within normal  limits  BASIC METABOLIC PANEL - Abnormal; Notable for the following:    Sodium 120 (*)    Chloride 84 (*)    Glucose, Bld 146 (*)    Calcium 8.4 (*)    All other components within normal limits  URINALYSIS, ROUTINE W REFLEX MICROSCOPIC (NOT AT [facility]) - Abnormal; Notable for the following:    APPearance TURBID (*)    Glucose, UA 100 (*)    Hgb urine dipstick LARGE (*)    Protein, ur 30 (*)    Leukocytes, UA LARGE (*)    All other components within normal limits  URINE MICROSCOPIC-ADD ON - Abnormal; Notable for the following:    Squamous Epithelial / LPF 6-30 (*)    Bacteria, UA FEW (*)    All other components within normal limits  URINE CULTURE    Imaging Review Dg Chest 2 View  05/04/2016  CLINICAL DATA:  Acute onset of cough.  Initial encounter. EXAM: CHEST  2 VIEW COMPARISON:  Chest radiograph performed 04/20/2016 FINDINGS: The lungs are well-aerated. Mild vascular congestion is noted. Mild bibasilar atelectasis is seen. There is no evidence of pleural effusion or pneumothorax. The heart is normal in size; the mediastinal contour is within normal limits. No acute osseous abnormalities are seen. Clips are seen at the left axilla. IMPRESSION: Mild vascular congestion noted.  Mild bibasilar atelectasis seen. Electronically  Signed   By: Garald Balding M.D.   On: 05/04/2016 22:49   Ct Head Wo Contrast  05/04/2016  CLINICAL DATA:  Fall with head injury. Right-sided facial bruising. Initial encounter. EXAM: CT HEAD WITHOUT CONTRAST CT MAXILLOFACIAL WITHOUT CONTRAST TECHNIQUE: Multidetector CT imaging of the head and maxillofacial structures were performed using the standard protocol without intravenous contrast. Multiplanar CT image reconstructions of the maxillofacial structures were also generated. COMPARISON:  Brain MRI 04/29/2015 FINDINGS: CT HEAD FINDINGS Skull and Sinuses:Facial findings described below. Right frontal scalp and forehead hematoma without calvarial fracture. Brain: No evidence of acute infarction, hemorrhage, hydrocephalus, or mass lesion/mass effect. Atrophy with ventriculomegaly, stable from 2016. Mild to moderate chronic microvascular disease with ischemic gliosis confluent around the frontal horns of the lateral ventricles. CT MAXILLOFACIAL FINDINGS The inferior mandibular symphysis is not visualized, but no signs of injury at this level. Right forehead hematoma without facial fracture or mandibular dislocation. No hemo sinus. Bilateral cataract resection. No evidence of globe or other postseptal injury. Right lower second premolar has a large periapical erosion but has undergone root canal. IMPRESSION: 1. No evidence of intracranial injury. 2. Right forehead hematoma without underlying fracture. Electronically Signed   By: Monte Fantasia M.D.   On: 05/04/2016 22:56   Ct Maxillofacial Wo Cm  05/04/2016  CLINICAL DATA:  Fall with head injury. Right-sided facial bruising. Initial encounter. EXAM: CT HEAD WITHOUT CONTRAST CT MAXILLOFACIAL WITHOUT CONTRAST TECHNIQUE: Multidetector CT imaging of the head and maxillofacial structures were performed using the standard protocol without intravenous contrast. Multiplanar CT image reconstructions of the maxillofacial structures were also generated. COMPARISON:  Brain  MRI 04/29/2015 FINDINGS: CT HEAD FINDINGS Skull and Sinuses:Facial findings described below. Right frontal scalp and forehead hematoma without calvarial fracture. Brain: No evidence of acute infarction, hemorrhage, hydrocephalus, or mass lesion/mass effect. Atrophy with ventriculomegaly, stable from 2016. Mild to moderate chronic microvascular disease with ischemic gliosis confluent around the frontal horns of the lateral ventricles. CT MAXILLOFACIAL FINDINGS The inferior mandibular symphysis is not visualized, but no signs of injury at this level. Right forehead hematoma without facial fracture or mandibular  dislocation. No hemo sinus. Bilateral cataract resection. No evidence of globe or other postseptal injury. Right lower second premolar has a large periapical erosion but has undergone root canal. IMPRESSION: 1. No evidence of intracranial injury. 2. Right forehead hematoma without underlying fracture. Electronically Signed   By: Monte Fantasia M.D.   On: 05/04/2016 22:56   I have personally reviewed and evaluated these images and lab results as part of my medical decision-making.   EKG Interpretation None      MDM   Final diagnoses:  Hyponatremia    Hyponatremia, likely 2/2 SIADH. Has increasing weakness and fatigue as well. No other obvious causes. Hit head on a fall last night so CT scans done and negative. Bolus and infusion initiated. Will admit for further monitoring.   I personally performed the services described in this documentation, which was scribed in my presence. The recorded information has been reviewed and is accurate.    Merrily Pew, MD 05/04/16 [phone]

## 2016-05-04 NOTE — ED Notes (Signed)
Pt was moving around in the dark in the bedroom last night and fell and hit the side of her face.  Bruising and swelling to right eye and cheek, no LOC, not on blood thinners.

## 2016-05-04 NOTE — ED Notes (Signed)
Pt fell last pm hit side of face and head on door frame  Denies loc,  Has hematoma to head and bruising to rt side of face and eye

## 2016-05-05 ENCOUNTER — Encounter (HOSPITAL_COMMUNITY): Payer: Self-pay

## 2016-05-05 DIAGNOSIS — E7800 Pure hypercholesterolemia, unspecified: Secondary | ICD-10-CM

## 2016-05-05 DIAGNOSIS — R0989 Other specified symptoms and signs involving the circulatory and respiratory systems: Secondary | ICD-10-CM | POA: Diagnosis present

## 2016-05-05 DIAGNOSIS — N390 Urinary tract infection, site not specified: Secondary | ICD-10-CM | POA: Diagnosis present

## 2016-05-05 DIAGNOSIS — E222 Syndrome of inappropriate secretion of antidiuretic hormone: Secondary | ICD-10-CM | POA: Diagnosis present

## 2016-05-05 LAB — OSMOLALITY: Osmolality: 252 mOsm/kg — ABNORMAL LOW (ref 275–295)

## 2016-05-05 LAB — CBC
HCT: 35.7 % — ABNORMAL LOW (ref 36.0–46.0)
Hemoglobin: 11.9 g/dL — ABNORMAL LOW (ref 12.0–15.0)
MCH: 30.1 pg (ref 26.0–34.0)
MCHC: 33.3 g/dL (ref 30.0–36.0)
MCV: 90.2 fL (ref 78.0–100.0)
PLATELETS: 238 10*3/uL (ref 150–400)
RBC: 3.96 MIL/uL (ref 3.87–5.11)
RDW: 13.5 % (ref 11.5–15.5)
WBC: 12.5 10*3/uL — ABNORMAL HIGH (ref 4.0–10.5)

## 2016-05-05 LAB — BASIC METABOLIC PANEL
ANION GAP: 12 (ref 5–15)
Anion gap: 9 (ref 5–15)
BUN: 9 mg/dL (ref 6–20)
BUN: 12 mg/dL (ref 6–20)
CALCIUM: 7.9 mg/dL — AB (ref 8.9–10.3)
CALCIUM: 8.4 mg/dL — AB (ref 8.9–10.3)
CHLORIDE: 82 mmol/L — AB (ref 101–111)
CO2: 25 mmol/L (ref 22–32)
CO2: 26 mmol/L (ref 22–32)
CREATININE: 0.73 mg/dL (ref 0.44–1.00)
Chloride: 87 mmol/L — ABNORMAL LOW (ref 101–111)
Creatinine, Ser: 0.85 mg/dL (ref 0.44–1.00)
GFR calc Af Amer: >60 mL/min (ref >60)
GFR calc non Af Amer: >60 mL/min (ref >60)
GLUCOSE: 156 mg/dL — AB (ref 65–99)
Glucose, Bld: 166 mg/dL — ABNORMAL HIGH (ref 65–99)
POTASSIUM: 3.6 mmol/L (ref 3.5–5.1)
Potassium: 4.1 mmol/L (ref 3.5–5.1)
Sodium: 121 mmol/L — ABNORMAL LOW (ref 135–145)
Sodium: 120 mmol/L — ABNORMAL LOW (ref 135–145)

## 2016-05-05 MED ORDER — DEXTROSE 5 % IV SOLN
1.0000 g | INTRAVENOUS | Status: DC
  Administered 2016-05-05 – 2016-05-10 (×6): 1 g via INTRAVENOUS
  Filled 2016-05-05 (×7): qty 10

## 2016-05-05 MED ORDER — ACETAMINOPHEN 650 MG RE SUPP: 650.0000 mg | Freq: Four times a day (QID) | RECTAL | Status: DC | PRN

## 2016-05-05 MED ORDER — DONEPEZIL HCL 10 MG PO TABS
10.0000 mg | ORAL_TABLET | Freq: Every day | ORAL | Status: DC
  Administered 2016-05-05 – 2016-05-14 (×10): 10 mg via ORAL
  Filled 2016-05-05 (×12): qty 1

## 2016-05-05 MED ORDER — SODIUM CHLORIDE 0.9 % IV SOLN
INTRAVENOUS | Status: DC
  Administered 2016-05-05: 03:00:00 via INTRAVENOUS

## 2016-05-05 MED ORDER — ONDANSETRON HCL 4 MG PO TABS: 4.0000 mg | ORAL_TABLET | Freq: Four times a day (QID) | ORAL | Status: DC | PRN

## 2016-05-05 MED ORDER — OMEGA-3-ACID ETHYL ESTERS 1 G PO CAPS
1.0000 g | ORAL_CAPSULE | Freq: Every day | ORAL | Status: DC
  Administered 2016-05-05 – 2016-05-15 (×11): 1 g via ORAL
  Filled 2016-05-05 (×11): qty 1

## 2016-05-05 MED ORDER — ACETAMINOPHEN 325 MG PO TABS: 650.0000 mg | ORAL_TABLET | Freq: Four times a day (QID) | ORAL | Status: DC | PRN

## 2016-05-05 MED ORDER — HYDROMORPHONE HCL 1 MG/ML IJ SOLN: 0.5000 mg | INTRAMUSCULAR | Status: DC | PRN

## 2016-05-05 MED ORDER — ONDANSETRON HCL 4 MG/2ML IJ SOLN: 4.0000 mg | Freq: Four times a day (QID) | INTRAMUSCULAR | Status: DC | PRN

## 2016-05-05 MED ORDER — CALCIUM CARBONATE-VITAMIN D 500-200 MG-UNIT PO TABS
1.0000 | ORAL_TABLET | Freq: Every day | ORAL | Status: DC
  Administered 2016-05-05 – 2016-05-15 (×11): 1 via ORAL
  Filled 2016-05-05 (×11): qty 1

## 2016-05-05 MED ORDER — SODIUM CHLORIDE 0.9% FLUSH
3.0000 mL | Freq: Two times a day (BID) | INTRAVENOUS | Status: DC
  Administered 2016-05-05 – 2016-05-14 (×13): 3 mL via INTRAVENOUS

## 2016-05-05 MED ORDER — FUROSEMIDE 10 MG/ML IJ SOLN
40.0000 mg | Freq: Once | INTRAMUSCULAR | Status: AC
  Administered 2016-05-05: 40 mg via INTRAVENOUS
  Filled 2016-05-05: qty 4

## 2016-05-05 MED ORDER — ASPIRIN 81 MG PO CHEW
81.0000 mg | CHEWABLE_TABLET | Freq: Every day | ORAL | Status: DC
  Administered 2016-05-05 – 2016-05-15 (×11): 81 mg via ORAL
  Filled 2016-05-05 (×11): qty 1

## 2016-05-05 MED ORDER — LISINOPRIL 5 MG PO TABS
5.0000 mg | ORAL_TABLET | Freq: Every day | ORAL | Status: DC
  Administered 2016-05-05 – 2016-05-06 (×2): 5 mg via ORAL
  Filled 2016-05-05 (×2): qty 1

## 2016-05-05 MED ORDER — ENOXAPARIN SODIUM 30 MG/0.3ML ~~LOC~~ SOLN
30.0000 mg | SUBCUTANEOUS | Status: DC
  Administered 2016-05-05: 30 mg via SUBCUTANEOUS
  Filled 2016-05-05: qty 0.3

## 2016-05-05 MED ORDER — ADULT MULTIVITAMIN W/MINERALS CH
1.0000 | ORAL_TABLET | Freq: Every day | ORAL | Status: DC
Start: 2016-05-05 — End: 2016-05-15
  Administered 2016-05-05 – 2016-05-15 (×11): 1 via ORAL
  Filled 2016-05-05 (×11): qty 1

## 2016-05-05 MED ORDER — OXYCODONE HCL 5 MG PO TABS
5.0000 mg | ORAL_TABLET | ORAL | Status: DC | PRN
  Administered 2016-05-08 – 2016-05-09 (×2): 5 mg via ORAL
  Filled 2016-05-05 (×2): qty 1

## 2016-05-05 NOTE — H&P (Signed)
[facility] Admission History and Physical       Carla Robles [id number] DOB: 03-16-1934 DOA: 05/04/2016  Referring physician:  EDP PCP: Rachell Cipro, MD  Specialists:   Chief Complaint: Weakness  HPI: Carla Robles is a 82 y.o. female with a remote hx of Breast Cancer, SIADH, CAD, HTN, and Hyperlipidemia who presented to the [facility] ED with complaints of increased weakness and falls, with a fall last night in which she hit her head on the corner of a table.    She was evaluated and had a CT scan of the head which was negative for acute findings, and her labs revealed Hyponatremia (120) and a UTI.   She was referred to [facility] for admission.     Review of Systems:  Constitutional: No Weight Loss, No Weight Gain, Night Sweats, Fevers, Chills, Dizziness, Light Headedness, Fatigue, +Generalized Weakness HEENT: No Headaches, Difficulty Swallowing,Tooth/Dental Problems,Sore Throat,  No Sneezing, Rhinitis, Ear Ache, Nasal Congestion, or Post Nasal Drip,  Cardio-vascular:  No Chest pain, Orthopnea, PND, Edema in Lower Extremities, Anasarca, Dizziness, Palpitations  Resp: No Dyspnea, No DOE, No Productive Cough, No Non-Productive Cough, No Hemoptysis, No Wheezing.    GI: No Heartburn, Indigestion, Abdominal Pain, Nausea, Vomiting, Diarrhea, Constipation, Hematemesis, Hematochezia, Melena, Change in Bowel Habits,  Loss of Appetite  GU: No Dysuria, No Change in Color of Urine, No Urgency or Urinary Frequency, No Flank pain.  Musculoskeletal: No Joint Pain or Swelling, No Decreased Range of Motion, No Back Pain.  Neurologic: No Syncope, No Seizures, Muscle Weakness, Paresthesia, Vision Disturbance or Loss, No Diplopia, No Vertigo, No Difficulty Walking,  Skin: No Rash or Lesions. Psych: No Change in Mood or Affect, No Depression or Anxiety, No Memory loss, No Confusion, or Hallucinations   Past Medical History  Diagnosis Date  . Hypertension   . Hypercholesteremia   . Breast  cancer ([place])   . NSTEMI (non-ST elevated myocardial infarction) ([place])     in setting of low Na:  Echo 7/22:  EF 60-65%, normal wall motion, mild LVH, grade 1 diast dysfxn, PASP 29.;  Myoview:  EF 85% and no ischemia  . SIADH (syndrome of inappropriate ADH production) ([place])   . Arthritis   . Personal history of  adenomatouscolonic polyps 04/22/2012    2 diminutive adenomas 12/2006 [name])  . Hx of echocardiogram     Echo (9/14):  Mild focal basal septal hypertrophy, EF 55-60%, normal wall motion, Gr 1 DD      Past Surgical History  Procedure Laterality Date  . Breast lumpectomy    . Cataract extraction w/ intraocular lens  implant, bilateral  2011  . Colonoscopy        Prior to Admission medications   Medication Sig Start Date End Date Taking? Authorizing Provider  albuterol (PROVENTIL HFA;VENTOLIN HFA) 108 (90 Base) MCG/ACT inhaler Inhale 2 puffs into the lungs every 6 (six) hours as needed for wheezing or shortness of breath. 04/20/16   Fredia Sorrow, MD  aspirin 81 MG tablet Take 81 mg by mouth daily.      Historical Provider, MD  azelastine (ASTELIN) 0.1 % nasal spray Place into both nostrils 2 (two) times daily. Use in each nostril as directed    Historical Provider, MD  Calcium Carbonate-Vitamin D (CALTRATE 600+D) 600-400 MG-UNIT per chew tablet Chew 1 tablet by mouth daily.      Historical Provider, MD  dextromethorphan-guaiFENesin (MUCINEX DM) 30-600 MG 12hr tablet Take 1 tablet by mouth 2 (two) times daily.  04/20/16   Fredia Sorrow, MD  donepezil (ARICEPT) 5 MG tablet Take 2 tablets at bedtime    Historical Provider, MD  fish oil-omega-3 fatty acids 1000 MG capsule Take 1 g by mouth daily.      Historical Provider, MD  lisinopril (PRINIVIL,ZESTRIL) 5 MG tablet Take 5 mg by mouth daily.    Historical Provider, MD  Loratadine (CLARITIN PO) Take by mouth.    Historical Provider, MD  Multiple Vitamin (MULTIVITAMIN) tablet Take 1 tablet by mouth daily.      Historical Provider, MD      Allergies  Allergen Reactions  . Other     hmg coa redustase inhibitors - muscle aches  . Statins Other (See Comments)    Muscle aches     Social History:  reports that she has quit smoking. Her smoking use included Cigarettes. She has never used smokeless tobacco. She reports that she drinks alcohol. She reports that she does not use illicit drugs.    Family History  Problem Relation Age of Onset  . Diabetes Father   . Hypertension Mother   . Stroke Father        Physical Exam:  GEN:  Pleasant Elderly Well Nourished and Well Developed  82 y.o. female examined and in no acute distress; cooperative with exam Filed Vitals:   05/04/16 2047 05/04/16 2323 05/04/16 2330 05/05/16 0000  BP: 187/85 173/91 175/94 189/94  Pulse: 92 86 92 93  Temp: 98.6 F (37 C) 98.5 F (36.9 C)    TempSrc: Oral Oral    Resp: 20 18  12   Height: 5' 3.5" (1.613 m)     Weight: 63.504 kg (140 lb)     SpO2: 100% 94% 93% 86%   Blood pressure 189/94, pulse 93, temperature 98.5 F (36.9 C), temperature source Oral, resp. rate 12, height 5' 3.5" (1.613 m), weight 63.504 kg (140 lb), SpO2 86 %. PSYCH: She is alert and oriented x4; does not appear anxious does not appear depressed; affect is normal HEENT: Normocephalic and + Right Peri-Orbital Ecchymosis, Mucous membranes pink; PERRLA; EOM intact; Fundi:  Benign;  No scleral icterus, Nares: Patent, Oropharynx: Clear,  Fair Dentition,    Neck:  FROM, No Cervical Lymphadenopathy nor Thyromegaly or Carotid Bruit; No JVD; Breasts:: Not examined CHEST WALL: No tenderness CHEST: Normal respiration, clear to auscultation bilaterally HEART: Regular rate and rhythm; no murmurs rubs or gallops BACK: No kyphosis or scoliosis; No CVA tenderness ABDOMEN: Positive Bowel Sounds, Soft Non-Tender, No Rebound or Guarding; No Masses, No Organomegaly. Rectal Exam: Not done EXTREMITIES: No Cyanosis, Clubbing, or Edema; No Ulcerations. Genitalia: not examined PULSES: 2+  and symmetric SKIN: Normal hydration no rash or ulceration CNS:  Alert and Oriented x 4, No Focal Deficits Vascular: pulses palpable throughout    Labs on Admission:  Basic Metabolic Panel:  Recent Labs Lab 05/04/16 2025  NA 120*  K 4.0  CL 84*  CO2 25  GLUCOSE 146*  BUN 13  CREATININE 0.84  CALCIUM 8.4*   Liver Function Tests: No results for input(s): AST, ALT, ALKPHOS, BILITOT, PROT, ALBUMIN in the last 168 hours. No results for input(s): LIPASE, AMYLASE in the last 168 hours. No results for input(s): AMMONIA in the last 168 hours. CBC:  Recent Labs Lab 05/04/16 2025  WBC 14.1*  NEUTROABS 12.8*  HGB 12.9  HCT 37.6  MCV 92.2  PLT 254   Cardiac Enzymes: No results for input(s): CKTOTAL, CKMB, CKMBINDEX, TROPONINI in the last 168 hours.  BNP (last 3 results) No results for input(s): BNP in the last 8760 hours.  ProBNP (last 3 results) No results for input(s): PROBNP in the last 8760 hours.  CBG: No results for input(s): GLUCAP in the last 168 hours.  Radiological Exams on Admission: Dg Chest 2 View  05/04/2016  CLINICAL DATA:  Acute onset of cough.  Initial encounter. EXAM: CHEST  2 VIEW COMPARISON:  Chest radiograph performed 04/20/2016 FINDINGS: The lungs are well-aerated. Mild vascular congestion is noted. Mild bibasilar atelectasis is seen. There is no evidence of pleural effusion or pneumothorax. The heart is normal in size; the mediastinal contour is within normal limits. No acute osseous abnormalities are seen. Clips are seen at the left axilla. IMPRESSION: Mild vascular congestion noted.  Mild bibasilar atelectasis seen. Electronically Signed   By: Garald Balding M.D.   On: 05/04/2016 22:49   Ct Head Wo Contrast  05/04/2016  CLINICAL DATA:  Fall with head injury. Right-sided facial bruising. Initial encounter. EXAM: CT HEAD WITHOUT CONTRAST CT MAXILLOFACIAL WITHOUT CONTRAST TECHNIQUE: Multidetector CT imaging of the head and maxillofacial structures were  performed using the standard protocol without intravenous contrast. Multiplanar CT image reconstructions of the maxillofacial structures were also generated. COMPARISON:  Brain MRI 04/29/2015 FINDINGS: CT HEAD FINDINGS Skull and Sinuses:Facial findings described below. Right frontal scalp and forehead hematoma without calvarial fracture. Brain: No evidence of acute infarction, hemorrhage, hydrocephalus, or mass lesion/mass effect. Atrophy with ventriculomegaly, stable from 2016. Mild to moderate chronic microvascular disease with ischemic gliosis confluent around the frontal horns of the lateral ventricles. CT MAXILLOFACIAL FINDINGS The inferior mandibular symphysis is not visualized, but no signs of injury at this level. Right forehead hematoma without facial fracture or mandibular dislocation. No hemo sinus. Bilateral cataract resection. No evidence of globe or other postseptal injury. Right lower second premolar has a large periapical erosion but has undergone root canal. IMPRESSION: 1. No evidence of intracranial injury. 2. Right forehead hematoma without underlying fracture. Electronically Signed   By: Monte Fantasia M.D.   On: 05/04/2016 22:56   Ct Maxillofacial Wo Cm  05/04/2016  CLINICAL DATA:  Fall with head injury. Right-sided facial bruising. Initial encounter. EXAM: CT HEAD WITHOUT CONTRAST CT MAXILLOFACIAL WITHOUT CONTRAST TECHNIQUE: Multidetector CT imaging of the head and maxillofacial structures were performed using the standard protocol without intravenous contrast. Multiplanar CT image reconstructions of the maxillofacial structures were also generated. COMPARISON:  Brain MRI 04/29/2015 FINDINGS: CT HEAD FINDINGS Skull and Sinuses:Facial findings described below. Right frontal scalp and forehead hematoma without calvarial fracture. Brain: No evidence of acute infarction, hemorrhage, hydrocephalus, or mass lesion/mass effect. Atrophy with ventriculomegaly, stable from 2016. Mild to moderate  chronic microvascular disease with ischemic gliosis confluent around the frontal horns of the lateral ventricles. CT MAXILLOFACIAL FINDINGS The inferior mandibular symphysis is not visualized, but no signs of injury at this level. Right forehead hematoma without facial fracture or mandibular dislocation. No hemo sinus. Bilateral cataract resection. No evidence of globe or other postseptal injury. Right lower second premolar has a large periapical erosion but has undergone root canal. IMPRESSION: 1. No evidence of intracranial injury. 2. Right forehead hematoma without underlying fracture. Electronically Signed   By: Monte Fantasia M.D.   On: 05/04/2016 22:56      Assessment/Plan:   82 y.o. female with  Principal Problem:    Hyponatremia/SIADH (syndrome of inappropriate ADH production) ([facility])   -UA has  s Spec Gravity of 1.016    Urine Osm and Urine Electrolytes ordered   -  IV Lasix x 1 dose   -Fluid Restriction   -Monitor Na+ Levels  Active Problems:  Pulmonary vascular congestion- last 2D ECHO was 2014 EF = 60%   2D ECHO in AM   IV Lasix x1     Urinary tract infection, site not specified   Urine C+S sent   IV Rocephin     Essential hypertension   On Lisinopril Rx   Monitor BPs      Hypercholesteremia   Continue Omega 3 Fatty Acids        Cognitive Impairment   On Aricept Rx    DVT Prophylaxis   Lovenox    Code Status:     FULL CODE       Family Communication:   No Family Present    Disposition Plan:   Observation Status        Time spent:    78 Minutes      Theressa Millard [facility] Pager [phone]   If [place] Please Contact the Day Rounding Team MD for [facility]  If 7PM-7AM, Please Contact Night-Floor Coverage  [email or web address] Password [email or web address] 05/05/2016, 1:58 AM     ADDENDUM:   Patient was seen and examined on 05/05/2016

## 2016-05-05 NOTE — Progress Notes (Signed)
PROGRESS NOTE  Carla Robles [id number] DOB: 28-Jul-1934 DOA: 05/04/2016 PCP: Rachell Cipro, MD   LOS: 1 day   Brief Narrative: 82 y.o. female with a remote hx of Breast Cancer, SIADH, CAD, HTN, and Hyperlipidemia who presented to the [facility] ED with complaints of increased weakness and falls, with a fall last night in which she hit her head on the corner of a table. She was found to be hyponatremic with sodium of 120 on admission.  Assessment & Plan: Principal Problem:   Hyponatremia Active Problems:   Essential hypertension   Mild cognitive impairment   Urinary tract infection, site not specified   Pulmonary vascular congestion   SIADH (syndrome of inappropriate ADH production) (HCC)   Hypercholesteremia    Hyponatremia due to SIADH - continue fluid restriction  - stop all IVF - s/p Lasix x 1 - repeat renal function in pm and tomorrow morning  Mild pulmonary edema on CXR - 2D echo pending - on room air - s/p Lasix x 1  UTI - Rocephin - cultures pending  HTN - continue Lisnopril  Cognitive impairment  - continue Aricept   DVT prophylaxis: Lovenox Code Status: Full Family Communication: no family bedside Disposition Plan: home when ready, 2-3 days   Consultants:   None   Procedures:   None   Antimicrobials:  Ceftriaxone 6/3 >>   Subjective: - No complaints this morning, denies any chest pain, denies any shortness of breath. Little memory to what happened yesterday  Objective: Filed Vitals:   05/04/16 2330 05/05/16 0000 05/05/16 0204 05/05/16 0512  BP: 175/94 189/94 188/84 161/78  Pulse: 92 93 83 79  Temp:   98.1 F (36.7 C) 97.7 F (36.5 C)  TempSrc:   Oral Oral  Resp:  12 18 18   Height:   5' 3.5" (1.613 m)   Weight:   66.497 kg (146 lb 9.6 oz)   SpO2: 93% 86% 96% 94%   No intake or output data in the 24 hours ending 05/05/16 1301 Filed Weights   05/04/16 2047 05/05/16 0204  Weight: 63.504 kg (140 lb) 66.497 kg (146 lb 9.6 oz)     Examination: Constitutional: NAD Filed Vitals:   05/04/16 2330 05/05/16 0000 05/05/16 0204 05/05/16 0512  BP: 175/94 189/94 188/84 161/78  Pulse: 92 93 83 79  Temp:   98.1 F (36.7 C) 97.7 F (36.5 C)  TempSrc:   Oral Oral  Resp:  12 18 18   Height:   5' 3.5" (1.613 m)   Weight:   66.497 kg (146 lb 9.6 oz)   SpO2: 93% 86% 96% 94%   Eyes: PERRL, lids and conjunctivae normal, Large ecchymotic area periorbital on the right ENMT: Mucous membranes are moist Respiratory: clear to auscultation bilaterally, no wheezing, no crackles. Normal respiratory effort. No accessory muscle use.  Cardiovascular: Regular rate and rhythm, no murmurs / rubs / gallops. No LE edema. 2+ pedal pulses. No carotid bruits.  Abdomen: no tenderness. Bowel sounds positive.  Musculoskeletal: no clubbing / cyanosis. Normal muscle tone.  Neurologic: Nonfocal Psychiatric: Normal judgment and insight. Alert and oriented to time and person, not to place. Normal mood.    Data Reviewed: I have personally reviewed following labs and imaging studies  CBC:  Recent Labs Lab 05/04/16 2025 05/05/16 0520  WBC 14.1* 12.5*  NEUTROABS 12.8*  --   HGB 12.9 11.9*  HCT 37.6 35.7*  MCV 92.2 90.2  PLT 254 99991111   Basic Metabolic Panel:  Recent Labs Lab 05/04/16 2025  05/05/16 0520  NA 120* 121*  K 4.0 4.1  CL 84* 87*  CO2 25 25  GLUCOSE 146* 156*  BUN 13 9  CREATININE 0.84 0.73  CALCIUM 8.4* 7.9*   GFR: Estimated Creatinine Clearance: 52.9 mL/min (by C-G formula based on Cr of 0.73). Liver Function Tests: No results for input(s): AST, ALT, ALKPHOS, BILITOT, PROT, ALBUMIN in the last 168 hours. No results for input(s): LIPASE, AMYLASE in the last 168 hours. No results for input(s): AMMONIA in the last 168 hours. Coagulation Profile: No results for input(s): INR, PROTIME in the last 168 hours. Cardiac Enzymes: No results for input(s): CKTOTAL, CKMB, CKMBINDEX, TROPONINI in the last 168 hours. BNP (last 3  results) No results for input(s): PROBNP in the last 8760 hours. HbA1C: No results for input(s): HGBA1C in the last 72 hours. CBG: No results for input(s): GLUCAP in the last 168 hours. Lipid Profile: No results for input(s): CHOL, HDL, LDLCALC, TRIG, CHOLHDL, LDLDIRECT in the last 72 hours. Thyroid Function Tests: No results for input(s): TSH, T4TOTAL, FREET4, T3FREE, THYROIDAB in the last 72 hours. Anemia Panel: No results for input(s): VITAMINB12, FOLATE, FERRITIN, TIBC, IRON, RETICCTPCT in the last 72 hours. Urine analysis:    Component Value Date/Time   COLORURINE YELLOW 05/04/2016 2250   APPEARANCEUR TURBID* 05/04/2016 2250   LABSPEC 1.016 05/04/2016 2250   PHURINE 7.5 05/04/2016 2250   GLUCOSEU 100* 05/04/2016 2250   HGBUR LARGE* 05/04/2016 2250   BILIRUBINUR NEGATIVE 05/04/2016 2250   KETONESUR NEGATIVE 05/04/2016 2250   PROTEINUR 30* 05/04/2016 2250   UROBILINOGEN 0.2 08/05/2013 1850   NITRITE NEGATIVE 05/04/2016 2250   LEUKOCYTESUR LARGE* 05/04/2016 2250   Sepsis Labs: Invalid input(s): PROCALCITONIN, LACTICIDVEN  No results found for this or any previous visit (from the past 240 hour(s)).    Radiology Studies: Dg Chest 2 View  05/04/2016  CLINICAL DATA:  Acute onset of cough.  Initial encounter. EXAM: CHEST  2 VIEW COMPARISON:  Chest radiograph performed 04/20/2016 FINDINGS: The lungs are well-aerated. Mild vascular congestion is noted. Mild bibasilar atelectasis is seen. There is no evidence of pleural effusion or pneumothorax. The heart is normal in size; the mediastinal contour is within normal limits. No acute osseous abnormalities are seen. Clips are seen at the left axilla. IMPRESSION: Mild vascular congestion noted.  Mild bibasilar atelectasis seen. Electronically Signed   By: Garald Balding M.D.   On: 05/04/2016 22:49   Ct Head Wo Contrast  05/04/2016  CLINICAL DATA:  Fall with head injury. Right-sided facial bruising. Initial encounter. EXAM: CT HEAD WITHOUT  CONTRAST CT MAXILLOFACIAL WITHOUT CONTRAST TECHNIQUE: Multidetector CT imaging of the head and maxillofacial structures were performed using the standard protocol without intravenous contrast. Multiplanar CT image reconstructions of the maxillofacial structures were also generated. COMPARISON:  Brain MRI 04/29/2015 FINDINGS: CT HEAD FINDINGS Skull and Sinuses:Facial findings described below. Right frontal scalp and forehead hematoma without calvarial fracture. Brain: No evidence of acute infarction, hemorrhage, hydrocephalus, or mass lesion/mass effect. Atrophy with ventriculomegaly, stable from 2016. Mild to moderate chronic microvascular disease with ischemic gliosis confluent around the frontal horns of the lateral ventricles. CT MAXILLOFACIAL FINDINGS The inferior mandibular symphysis is not visualized, but no signs of injury at this level. Right forehead hematoma without facial fracture or mandibular dislocation. No hemo sinus. Bilateral cataract resection. No evidence of globe or other postseptal injury. Right lower second premolar has a large periapical erosion but has undergone root canal. IMPRESSION: 1. No evidence of intracranial injury. 2. Right  forehead hematoma without underlying fracture. Electronically Signed   By: Monte Fantasia M.D.   On: 05/04/2016 22:56   Ct Maxillofacial Wo Cm  05/04/2016  CLINICAL DATA:  Fall with head injury. Right-sided facial bruising. Initial encounter. EXAM: CT HEAD WITHOUT CONTRAST CT MAXILLOFACIAL WITHOUT CONTRAST TECHNIQUE: Multidetector CT imaging of the head and maxillofacial structures were performed using the standard protocol without intravenous contrast. Multiplanar CT image reconstructions of the maxillofacial structures were also generated. COMPARISON:  Brain MRI 04/29/2015 FINDINGS: CT HEAD FINDINGS Skull and Sinuses:Facial findings described below. Right frontal scalp and forehead hematoma without calvarial fracture. Brain: No evidence of acute infarction,  hemorrhage, hydrocephalus, or mass lesion/mass effect. Atrophy with ventriculomegaly, stable from 2016. Mild to moderate chronic microvascular disease with ischemic gliosis confluent around the frontal horns of the lateral ventricles. CT MAXILLOFACIAL FINDINGS The inferior mandibular symphysis is not visualized, but no signs of injury at this level. Right forehead hematoma without facial fracture or mandibular dislocation. No hemo sinus. Bilateral cataract resection. No evidence of globe or other postseptal injury. Right lower second premolar has a large periapical erosion but has undergone root canal. IMPRESSION: 1. No evidence of intracranial injury. 2. Right forehead hematoma without underlying fracture. Electronically Signed   By: Monte Fantasia M.D.   On: 05/04/2016 22:56     Scheduled Meds: . aspirin  81 mg Oral Daily  . calcium-vitamin D  1 tablet Oral Daily  . cefTRIAXone (ROCEPHIN)  IV  1 g Intravenous Q24H  . donepezil  10 mg Oral QHS  . enoxaparin (LOVENOX) injection  30 mg Subcutaneous Q24H  . lisinopril  5 mg Oral Daily  . multivitamin with minerals  1 tablet Oral Daily  . omega-3 acid ethyl esters  1 g Oral Daily  . sodium chloride flush  3 mL Intravenous Q12H   Continuous Infusions:    Marzetta Board, MD, PhD [facility] Pager [phone]  If 7PM-7AM, please contact night-coverage [email or web address] Password [email or web address] 05/05/2016, 1:01 PM

## 2016-05-06 ENCOUNTER — Inpatient Hospital Stay (HOSPITAL_COMMUNITY): Payer: Medicare Other

## 2016-05-06 DIAGNOSIS — E871 Hypo-osmolality and hyponatremia: Secondary | ICD-10-CM

## 2016-05-06 DIAGNOSIS — I5031 Acute diastolic (congestive) heart failure: Secondary | ICD-10-CM

## 2016-05-06 LAB — BASIC METABOLIC PANEL
ANION GAP: 10 (ref 5–15)
Anion gap: 13 (ref 5–15)
BUN: 10 mg/dL (ref 6–20)
BUN: 11 mg/dL (ref 6–20)
CALCIUM: 8.2 mg/dL — AB (ref 8.9–10.3)
CALCIUM: 8.1 mg/dL — AB (ref 8.9–10.3)
CHLORIDE: 81 mmol/L — AB (ref 101–111)
CO2: 26 mmol/L (ref 22–32)
CO2: 27 mmol/L (ref 22–32)
CREATININE: 0.63 mg/dL (ref 0.44–1.00)
CREATININE: 0.86 mg/dL (ref 0.44–1.00)
Chloride: 79 mmol/L — ABNORMAL LOW (ref 101–111)
GFR calc non Af Amer: >60 mL/min (ref >60)
GLUCOSE: 201 mg/dL — AB (ref 65–99)
Glucose, Bld: 158 mg/dL — ABNORMAL HIGH (ref 65–99)
Potassium: 3.3 mmol/L — ABNORMAL LOW (ref 3.5–5.1)
Potassium: 3.6 mmol/L (ref 3.5–5.1)
Sodium: 120 mmol/L — ABNORMAL LOW (ref 135–145)
Sodium: 116 mmol/L — CL (ref 135–145)

## 2016-05-06 LAB — SODIUM: Sodium: 116 mmol/L — CL (ref 135–145)

## 2016-05-06 LAB — ECHOCARDIOGRAM COMPLETE
HEIGHTINCHES: 63.5 in
WEIGHTICAEL: 2345.6 [oz_av]

## 2016-05-06 LAB — URINE CULTURE

## 2016-05-06 LAB — NA AND K (SODIUM & POTASSIUM), RAND UR
Potassium Urine: 38 mmol/L
SODIUM UR: 67 mmol/L

## 2016-05-06 LAB — OSMOLALITY, URINE: OSMOLALITY UR: 417 mosm/kg (ref 300–900)

## 2016-05-06 MED ORDER — LABETALOL HCL 200 MG PO TABS
200.0000 mg | ORAL_TABLET | Freq: Two times a day (BID) | ORAL | Status: DC
  Administered 2016-05-06 – 2016-05-09 (×6): 200 mg via ORAL
  Filled 2016-05-06 (×7): qty 1

## 2016-05-06 MED ORDER — SODIUM CHLORIDE 0.9 % IV SOLN
INTRAVENOUS | Status: DC
  Administered 2016-05-06: 09:00:00 via INTRAVENOUS

## 2016-05-06 MED ORDER — SODIUM CHLORIDE 3 % IV SOLN: INTRAVENOUS | Status: DC

## 2016-05-06 MED ORDER — ENOXAPARIN SODIUM 40 MG/0.4ML ~~LOC~~ SOLN
40.0000 mg | SUBCUTANEOUS | Status: DC
  Administered 2016-05-07 – 2016-05-15 (×9): 40 mg via SUBCUTANEOUS
  Filled 2016-05-06 (×9): qty 0.4

## 2016-05-06 MED ORDER — SODIUM CHLORIDE 3 % IV SOLN
INTRAVENOUS | Status: AC
  Administered 2016-05-06 – 2016-05-07 (×2): 38 mL/h via INTRAVENOUS
  Filled 2016-05-06 (×5): qty 500

## 2016-05-06 NOTE — Progress Notes (Signed)
Report called to 27mw sharika rn.

## 2016-05-06 NOTE — Procedures (Signed)
Central Venous Catheter Insertion Procedure Note LYLA SKORA [id number] 05/21/1934  Procedure: Insertion of Central Venous Catheter Indications: Drug and/or fluid administration  Procedure Details Consent: Unable to obtain consent because of altered level of consciousness. unable to reach spouse.  Time Out: Verified patient identification, verified procedure, site/side was marked, verified correct patient position, special equipment/implants available, medications/allergies/relevent history reviewed, required imaging and test results available.  Performed  Maximum sterile technique was used including antiseptics, cap, gloves, gown, hand hygiene, mask and sheet. Skin prep: Chlorhexidine; local anesthetic administered A antimicrobial bonded/coated triple lumen catheter was placed in the left internal jugular vein using the Seldinger technique.  Evaluation Blood flow good Complications: No apparent complications Patient did tolerate procedure well. Chest X-ray ordered to verify placement.  CXR: LIJ approach CVC with tip overlying the SVC.  After local anesthesia, the finder needle was advanced into the vessel under ultrasound guidance. Dark red non-pulsatile blood was aspirated. The wire was threaded into the vessel and needle removed. The wire was confirmed in the vein with ultrasound. A nick was made in the skin and the tract was dilated. The catheter was advanced over the wire into the vessel. The wire was removed. All ports aspirated and flushed easily. The catheter was sutured in place and biopatch and dressing were applied. No complications. Post procedure CXR as above. EBL <5cc.   Dannielle Burn  Pulmonary and Critical Care 05/06/2016, 9:55 PM

## 2016-05-06 NOTE — Progress Notes (Signed)
CRITICAL VALUE ALERT  Critical value received:  Sodium  Date of notification:  05/06/16    Time of notification:  1700  Critical value read back:Yes.    Nurse who received alert:  Betha Loa  MD notified (1st page):  Wardell Heath  Time of first page:  1709  MD notified (2nd page):  Time of second page:  Responding MD:  Wardell Heath  Time MD responded:  [id number]

## 2016-05-06 NOTE — Progress Notes (Signed)
PROGRESS NOTE  Carla Robles [id number] DOB: [birth year] DOA: 05/04/2016 PCP: Rachell Cipro, MD   LOS: 2 days   Brief Narrative: 82 y.o. female with a remote hx of Breast Cancer, SIADH, CAD, HTN, and Hyperlipidemia who presented to the [facility] ED with complaints of increased weakness and falls, with a fall last night in which she hit her head on the corner of a table. She was found to be hyponatremic with sodium of 120 on admission.  Assessment & Plan: Principal Problem:   Hyponatremia Active Problems:   Essential hypertension   Mild cognitive impairment   Urinary tract infection, site not specified   Pulmonary vascular congestion   SIADH (syndrome of inappropriate ADH production) (HCC)   Hypercholesteremia    Hyponatremia due to SIADH - continue fluid restriction  - resume saline, appears now clinically dry - s/p Lasix x 1 on admission - repeat renal function in pm and tomorrow morning - Na stable at 120, no improvement   Mild pulmonary edema on CXR - 2D echo pending - on room air - s/p Lasix x 1, now gentle hydration for her hyponatremia  UTI - Rocephin - cultures pending, reviewed today with "multiple species, suggesting recollection". Favor treatment for 5 days.  HTN - continue Lisnopril, BP this morning 144/74  Cognitive impairment  - continue Aricept   DVT prophylaxis: Lovenox Code Status: Full Family Communication: Discussed with the husband bedside Disposition Plan: home when sodium close to 130 range   Consultants:   None   Procedures:   None   Antimicrobials:  Ceftriaxone 6/3 >>   Subjective: - No complaints this morning, denies any chest pain, denies any shortness of breath. Husband at bedside  Objective: Filed Vitals:   05/05/16 1314 05/05/16 2157 05/06/16 0509 05/06/16 1100  BP: 156/74 143/72 147/64 144/74  Pulse: 75 79 73 75  Temp: 98.4 F (36.9 C) 97.9 F (36.6 C) 98 F (36.7 C)   TempSrc:   Oral   Resp: 19 18 16    Height:       Weight:      SpO2: 93% 94% 93%     Intake/Output Summary (Last 24 hours) at 05/06/16 1105 Last data filed at 05/06/16 1011  Gross per 24 hour  Intake    320 ml  Output   1100 ml  Net   -780 ml   Filed Weights   05/04/16 2047 05/05/16 0204  Weight: 63.504 kg (140 lb) 66.497 kg (146 lb 9.6 oz)    Examination: Constitutional: NAD Filed Vitals:   05/05/16 1314 05/05/16 2157 05/06/16 0509 05/06/16 1100  BP: 156/74 143/72 147/64 144/74  Pulse: 75 79 73 75  Temp: 98.4 F (36.9 C) 97.9 F (36.6 C) 98 F (36.7 C)   TempSrc:   Oral   Resp: 19 18 16    Height:      Weight:      SpO2: 93% 94% 93%    Eyes: PERRL, lids and conjunctivae normal, Large ecchymotic area periorbital on the right Respiratory: clear to auscultation bilaterally, no wheezing, no crackles.  Cardiovascular: Regular rate and rhythm, no murmurs / rubs / gallops. No LE edema Abdomen: no tenderness. Bowel sounds positive.  Neurologic: Nonfocal Psychiatric: Alert and oriented to time and person, not to place.   Data Reviewed: I have personally reviewed following labs and imaging studies  CBC:  Recent Labs Lab 05/04/16 2025 05/05/16 0520  WBC 14.1* 12.5*  NEUTROABS 12.8*  --   HGB 12.9 11.9*  HCT 37.6  35.7*  MCV 92.2 90.2  PLT 254 99991111   Basic Metabolic Panel:  Recent Labs Lab 05/04/16 2025 05/05/16 0520 05/05/16 1342 05/06/16 0455  NA 120* 121* 120* 120*  K 4.0 4.1 3.6 3.3*  CL 84* 87* 82* 81*  CO2 25 25 26 26   GLUCOSE 146* 156* 166* 158*  BUN 13 9 12 10   CREATININE 0.84 0.73 0.85 0.63  CALCIUM 8.4* 7.9* 8.4* 8.2*   GFR: Estimated Creatinine Clearance: 52.9 mL/min (by C-G formula based on Cr of 0.63). Liver Function Tests: No results for input(s): AST, ALT, ALKPHOS, BILITOT, PROT, ALBUMIN in the last 168 hours. No results for input(s): LIPASE, AMYLASE in the last 168 hours. No results for input(s): AMMONIA in the last 168 hours. Coagulation Profile: No results for input(s): INR,  PROTIME in the last 168 hours. Cardiac Enzymes: No results for input(s): CKTOTAL, CKMB, CKMBINDEX, TROPONINI in the last 168 hours. BNP (last 3 results) No results for input(s): PROBNP in the last 8760 hours. HbA1C: No results for input(s): HGBA1C in the last 72 hours. CBG: No results for input(s): GLUCAP in the last 168 hours. Lipid Profile: No results for input(s): CHOL, HDL, LDLCALC, TRIG, CHOLHDL, LDLDIRECT in the last 72 hours. Thyroid Function Tests: No results for input(s): TSH, T4TOTAL, FREET4, T3FREE, THYROIDAB in the last 72 hours. Anemia Panel: No results for input(s): VITAMINB12, FOLATE, FERRITIN, TIBC, IRON, RETICCTPCT in the last 72 hours. Urine analysis:    Component Value Date/Time   COLORURINE YELLOW 05/04/2016 2250   APPEARANCEUR TURBID* 05/04/2016 2250   LABSPEC 1.016 05/04/2016 2250   PHURINE 7.5 05/04/2016 2250   GLUCOSEU 100* 05/04/2016 2250   HGBUR LARGE* 05/04/2016 2250   BILIRUBINUR NEGATIVE 05/04/2016 2250   KETONESUR NEGATIVE 05/04/2016 2250   PROTEINUR 30* 05/04/2016 2250   UROBILINOGEN 0.2 08/05/2013 1850   NITRITE NEGATIVE 05/04/2016 2250   LEUKOCYTESUR LARGE* 05/04/2016 2250   Sepsis Labs: Invalid input(s): PROCALCITONIN, LACTICIDVEN  Recent Results (from the past 240 hour(s))  Urine culture     Status: Abnormal   Collection Time: 05/04/16 10:50 PM  Result Value Ref Range Status   Specimen Description URINE, CLEAN CATCH  Final   Special Requests NONE  Final   Culture MULTIPLE SPECIES PRESENT, SUGGEST RECOLLECTION (A)  Final   Report Status 05/06/2016 FINAL  Final      Radiology Studies: Dg Chest 2 View  05/04/2016  CLINICAL DATA:  Acute onset of cough.  Initial encounter. EXAM: CHEST  2 VIEW COMPARISON:  Chest radiograph performed 04/20/2016 FINDINGS: The lungs are well-aerated. Mild vascular congestion is noted. Mild bibasilar atelectasis is seen. There is no evidence of pleural effusion or pneumothorax. The heart is normal in size; the  mediastinal contour is within normal limits. No acute osseous abnormalities are seen. Clips are seen at the left axilla. IMPRESSION: Mild vascular congestion noted.  Mild bibasilar atelectasis seen. Electronically Signed   By: Garald Balding M.D.   On: 05/04/2016 22:49   Ct Head Wo Contrast  05/04/2016  CLINICAL DATA:  Fall with head injury. Right-sided facial bruising. Initial encounter. EXAM: CT HEAD WITHOUT CONTRAST CT MAXILLOFACIAL WITHOUT CONTRAST TECHNIQUE: Multidetector CT imaging of the head and maxillofacial structures were performed using the standard protocol without intravenous contrast. Multiplanar CT image reconstructions of the maxillofacial structures were also generated. COMPARISON:  Brain MRI 04/29/2015 FINDINGS: CT HEAD FINDINGS Skull and Sinuses:Facial findings described below. Right frontal scalp and forehead hematoma without calvarial fracture. Brain: No evidence of acute infarction, hemorrhage, hydrocephalus,  or mass lesion/mass effect. Atrophy with ventriculomegaly, stable from 2016. Mild to moderate chronic microvascular disease with ischemic gliosis confluent around the frontal horns of the lateral ventricles. CT MAXILLOFACIAL FINDINGS The inferior mandibular symphysis is not visualized, but no signs of injury at this level. Right forehead hematoma without facial fracture or mandibular dislocation. No hemo sinus. Bilateral cataract resection. No evidence of globe or other postseptal injury. Right lower second premolar has a large periapical erosion but has undergone root canal. IMPRESSION: 1. No evidence of intracranial injury. 2. Right forehead hematoma without underlying fracture. Electronically Signed   By: Monte Fantasia M.D.   On: 05/04/2016 22:56   Ct Maxillofacial Wo Cm  05/04/2016  CLINICAL DATA:  Fall with head injury. Right-sided facial bruising. Initial encounter. EXAM: CT HEAD WITHOUT CONTRAST CT MAXILLOFACIAL WITHOUT CONTRAST TECHNIQUE: Multidetector CT imaging of the head  and maxillofacial structures were performed using the standard protocol without intravenous contrast. Multiplanar CT image reconstructions of the maxillofacial structures were also generated. COMPARISON:  Brain MRI 04/29/2015 FINDINGS: CT HEAD FINDINGS Skull and Sinuses:Facial findings described below. Right frontal scalp and forehead hematoma without calvarial fracture. Brain: No evidence of acute infarction, hemorrhage, hydrocephalus, or mass lesion/mass effect. Atrophy with ventriculomegaly, stable from 2016. Mild to moderate chronic microvascular disease with ischemic gliosis confluent around the frontal horns of the lateral ventricles. CT MAXILLOFACIAL FINDINGS The inferior mandibular symphysis is not visualized, but no signs of injury at this level. Right forehead hematoma without facial fracture or mandibular dislocation. No hemo sinus. Bilateral cataract resection. No evidence of globe or other postseptal injury. Right lower second premolar has a large periapical erosion but has undergone root canal. IMPRESSION: 1. No evidence of intracranial injury. 2. Right forehead hematoma without underlying fracture. Electronically Signed   By: Monte Fantasia M.D.   On: 05/04/2016 22:56     Scheduled Meds: . aspirin  81 mg Oral Daily  . calcium-vitamin D  1 tablet Oral Daily  . cefTRIAXone (ROCEPHIN)  IV  1 g Intravenous Q24H  . donepezil  10 mg Oral QHS  . [START ON 05/07/2016] enoxaparin (LOVENOX) injection  40 mg Subcutaneous Q24H  . lisinopril  5 mg Oral Daily  . multivitamin with minerals  1 tablet Oral Daily  . omega-3 acid ethyl esters  1 g Oral Daily  . sodium chloride flush  3 mL Intravenous Q12H   Continuous Infusions: . sodium chloride       Marzetta Board, MD, PhD [facility] Pager [phone]  If 7PM-7AM, please contact night-coverage [email or web address] Password [email or web address] 05/06/2016, 11:05 AM

## 2016-05-06 NOTE — Consult Note (Signed)
Renal Service Consult Note [facility]  Carla Robles 05/06/2016 Carla Robles D Requesting Physician:  Dr Carla Robles  Reason for Consult:  Hyponatremia HPI: The patient is a 82 y.o. year-old with history of HTN, HL, treated breast cancer and SIADH presented to ED on 6/2 after falling in her bedroom and hitting the R side of her face.  Family noted sig fatigue for a couple of weeks but was more weak on the day of admission and was unable to ambulate w/o assistance.  Otherwise she was doing ok.  She had been in the ED on May 19th for cough and sore throat and dc'd home, serum Na then was 131.  On 6/2 the serum Na was low at 120 and pt was admitted.  CT head was negative.   She received a dose of IV lasix the am of 6/3, 40 mg x 1.  The Na didn't improve.  Then she was started on NaCl at 50 cc/hr this am. This afternoon the SNa dropped from 120 to 116.  We are asked to see for assistance.    Patient is a very poor historian, there is a hx of dementia on Aricept. Also at home is on asa, proventil MDI, fish oil, MVI and lisinopril at home.    Here the patient is getting Rocephin, calc-D, asa, Aricept, lovenox, lisinopril, MVI, lovaza, NaCl at 50cc/hr.    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  Grew up in [place] , [place]. Had breast Ca 2013, not sure how it was treated, "ask my husband".  No DM.        ROS  denies CP  no joint pain   no HA  no blurry vision  no rash  no diarrhea  no nausea/ vomiting  no dysuria  no difficulty voiding  no change in urine color    Past Medical History  Past Medical History  Diagnosis Date  . Hypertension   . Hypercholesteremia   . Breast cancer ([place])   . NSTEMI (non-ST elevated myocardial infarction) ([place])     in setting of low Na:  Echo 7/22:  EF 60-65%, normal wall motion, mild LVH, grade 1 diast dysfxn, PASP 29.;  Myoview:  EF 85% and no ischemia  . SIADH (syndrome of inappropriate ADH production) ([place])    . Arthritis   . Personal history of  adenomatouscolonic polyps 04/22/2012    2 diminutive adenomas 12/2006 Carla Robles)  . Hx of echocardiogram     Echo (9/14):  Mild focal basal septal hypertrophy, EF 55-60%, normal wall motion, Gr 1 DD    Past Surgical History  Past Surgical History  Procedure Laterality Date  . Breast lumpectomy    . Cataract extraction w/ intraocular lens  implant, bilateral  2011  . Colonoscopy     Family History  Family History  Problem Relation Age of Onset  . Diabetes Father   . Hypertension Mother   . Stroke Father    Social History  reports that she has quit smoking. Her smoking use included Cigarettes. She has never used smokeless tobacco. She reports that she drinks alcohol. She reports that she does not use illicit drugs. Allergies  Allergies  Allergen Reactions  . Other     hmg coa redustase inhibitors - muscle aches  . Statins Other (See Comments)    Muscle aches    Home medications Prior to Admission medications   Medication Sig Start Date End Date  Taking? Authorizing Provider  albuterol (PROVENTIL HFA;VENTOLIN HFA) 108 (90 Base) MCG/ACT inhaler Inhale 2 puffs into the lungs every 6 (six) hours as needed for wheezing or shortness of breath. 04/20/16  Yes Carla Sorrow, MD  aspirin 81 MG tablet Take 81 mg by mouth daily.     Yes Historical Provider, MD  Calcium Carbonate-Vitamin D (CALTRATE 600+D) 600-400 MG-UNIT per chew tablet Chew 1 tablet by mouth 2 (two) times daily.    Yes Historical Provider, MD  donepezil (ARICEPT) 10 MG tablet Take 10 mg by mouth at bedtime.   Yes Historical Provider, MD  fish oil-omega-3 fatty acids 1000 MG capsule Take 1 g by mouth daily.     Yes Historical Provider, MD  lisinopril (PRINIVIL,ZESTRIL) 5 MG tablet Take 5 mg by mouth daily.   Yes Historical Provider, MD  Multiple Vitamin (MULTIVITAMIN) tablet Take 1 tablet by mouth daily.     Yes Historical Provider, MD   Liver Function Tests No results for input(s):  AST, ALT, ALKPHOS, BILITOT, PROT, ALBUMIN in the last 168 hours. No results for input(s): LIPASE, AMYLASE in the last 168 hours. CBC  Recent Labs Lab 05/04/16 2025 05/05/16 0520  WBC 14.1* 12.5*  NEUTROABS 12.8*  --   HGB 12.9 11.9*  HCT 37.6 35.7*  MCV 92.2 90.2  PLT 254 99991111   Basic Metabolic Panel  Recent Labs Lab 05/04/16 2025 05/05/16 0520 05/05/16 1342 05/06/16 0455 05/06/16 1529  NA 120* 121* 120* 120* 116*  K 4.0 4.1 3.6 3.3* 3.6  CL 84* 87* 82* 81* 79*  CO2 25 25 26 26 27   GLUCOSE 146* 156* 166* 158* 201*  BUN 13 9 12 10 11   CREATININE 0.84 0.73 0.85 0.63 0.86  CALCIUM 8.4* 7.9* 8.4* 8.2* 8.1*   Iron/TIBC/Ferritin/ %Sat No results found for: IRON, TIBC, FERRITIN, IRONPCTSAT  Filed Vitals:   05/05/16 2157 05/06/16 0509 05/06/16 1100 05/06/16 1500  BP: 143/72 147/64 144/74 146/72  Pulse: 79 73 75 74  Temp: 97.9 F (36.6 C) 98 F (36.7 C)  97.6 F (36.4 C)  TempSrc:  Oral  Oral  Resp: 18 16  18   Height:      Weight:      SpO2: 94% 93%  95%   Exam Gen disheveled, large R periorbital ecchymosis No rash, cyanosis or gangrene Sclera anicteric, throat clear  JVP 12 -15 cm Chest dec'd at bases, poor effort, distracted RRR no mrg Abd soft ntnd no mass or ascites +bs GU deferred MS no joint effusions or deformity Ext no LE or UE edema / no wounds or ulcers Neuro is awake, withdrawn, stares off frequently, partially oriented Knows "[place]", "2017", thought she was admitted today  UA 6-30 rbc/wbc/ epis Urine osm 417 UNa 67 CXR - no edema ECHO normal LVEF Na 116 K 3.6  BUN 11  Creat 0.86  CO2 27  CA 8.1  WBC 12k Hb 11.9 plt normal  Assessment: 1   Hyponatremia - euvolemic on exam and with high Uosm and UNa, suspect SIADH. Na is low and pt is confused so would recommend hypertonic saline.  Also ACEi/ ARB medications can block clearing of extra body water so will stop the ACEi.  With her history of SIADH, acei/ ARB's should be avoided.   2    Dementia 3   Cough - w neg CXR and no fever. On emp Rocephin.  4   HTN - stopping ACEi, would avoid ARB as well in this patient indefinitely.  Recommend labetalol 200 bid, have started.  5   History treated breast cancer   Plan - as above  Kelly Splinter MD [place] pager [phone]    cell [phone] 05/06/2016, 7:25 PM

## 2016-05-06 NOTE — Progress Notes (Signed)
eLink Physician-Brief Progress Note Patient Name: Carla Robles DOB: 05-Aug-1934 MRN: [id number]   Date of Service  05/06/2016  HPI/Events of Note  Asked to review CXR for L IJ central venous catheter placement. L IJ central venous catheter tip in distal SVC. No pneumothorax.   eICU Interventions  OK to use L IJ central venous catheter.     Intervention Category Intermediate Interventions: Diagnostic test evaluation  Lysle Dingwall 05/06/2016, 10:37 PM

## 2016-05-06 NOTE — Progress Notes (Signed)
MEDICATION RELATED CONSULT NOTE - INITIAL   Pharmacy may adjust dose of  Lovenox for VTE prophylaxis   Allergies  Allergen Reactions  . Other     hmg coa redustase inhibitors - muscle aches  . Statins Other (See Comments)    Muscle aches     Patient Measurements: Height: 5' 3.5" (161.3 cm) Weight: 146 lb 9.6 oz (66.497 kg) IBW/kg (Calculated) : 53.55   Vital Signs: Temp: 97.9 F (36.6 C) (06/03 2157) BP: 143/72 mmHg (06/03 2157) Pulse Rate: 79 (06/03 2157) Intake/Output from previous day: 06/03 0701 - 06/04 0700 In: 120 [P.O.:120] Out: 300 [Urine:300] Intake/Output from this shift:    Labs:  Recent Labs  05/04/16 2025 05/05/16 0520 05/05/16 1342  WBC 14.1* 12.5*  --   HGB 12.9 11.9*  --   HCT 37.6 35.7*  --   PLT 254 238  --   CREATININE 0.84 0.73 0.85   Estimated Creatinine Clearance: 49.8 mL/min (by C-G formula based on Cr of 0.85).   Microbiology: No results found for this or any previous visit (from the past 720 hour(s)).  Medical History: Past Medical History  Diagnosis Date  . Hypertension   . Hypercholesteremia   . Breast cancer ([place])   . NSTEMI (non-ST elevated myocardial infarction) ([place])     in setting of low Na:  Echo 7/22:  EF 60-65%, normal wall motion, mild LVH, grade 1 diast dysfxn, PASP 29.;  Myoview:  EF 85% and no ischemia  . SIADH (syndrome of inappropriate ADH production) ([place])   . Arthritis   . Personal history of  adenomatouscolonic polyps 04/22/2012    2 diminutive adenomas 12/2006 [name])  . Hx of echocardiogram     Echo (9/14):  Mild focal basal septal hypertrophy, EF 55-60%, normal wall motion, Gr 1 DD      Assessment:/Plan:  82 y.o female on Lovenox 30mg  q24h initially ordered -pharmacy may adjust.  Dose adjusted on 6/4 to 40mg  q24h due to CrCl ~ 38-31ml/min , wt 66kg. Scr 0.85, used Scr 1.0 due to age.  Hgb 12.9>11.9, pltc wnl , no bleeding noted.   Goal of Therapy:  appropriate dosage for renal function and  prevention of VTE   Nicole Cella, RPh Clinical Pharmacist Pager: [phone] 05/06/2016,12:04 AM

## 2016-05-07 LAB — SODIUM
SODIUM: 116 mmol/L — AB (ref 135–145)
Sodium: 121 mmol/L — ABNORMAL LOW (ref 135–145)
Sodium: 121 mmol/L — ABNORMAL LOW (ref 135–145)
Sodium: 125 mmol/L — ABNORMAL LOW (ref 135–145)
Sodium: 126 mmol/L — ABNORMAL LOW (ref 135–145)

## 2016-05-07 LAB — URINE CULTURE
Culture: NO GROWTH
Special Requests: NORMAL

## 2016-05-07 LAB — MRSA PCR SCREENING: MRSA BY PCR: NEGATIVE

## 2016-05-07 MED ORDER — GUAIFENESIN-DM 100-10 MG/5ML PO SYRP
5.0000 mL | ORAL_SOLUTION | ORAL | Status: DC | PRN
  Administered 2016-05-07: 5 mL via ORAL
  Filled 2016-05-07 (×2): qty 5

## 2016-05-07 NOTE — Progress Notes (Signed)
Paged Nephrology regarding sudden increase in patient's sodium level from 116 on admission to 126 currently. Baseline orientation is dementia however currently AxOx4 and is pleasantly confused thinking her husband is coming with a plane to pick her up. Nephrology would like to d/c 3% saline now instead of at 0000. Will continue to monitor. Joaquin Bend E, RN 05/07/2016 10:15 PM

## 2016-05-07 NOTE — Care Management Important Message (Signed)
Important Message  Patient Details  Name: Carla Robles MRN: [id number] Date of Birth: June 13, 1934   Medicare Important Message Given:  Yes    [name] Abena 05/07/2016, 11:45 AM

## 2016-05-07 NOTE — Progress Notes (Signed)
Spoke with ICU RN concerning PICC order from 6/4 at 1947. Due to patient's condition, temporary central line was placed. Need to know if MD would still like PICC placed. RN to follow up. Will continue to monitor.

## 2016-05-07 NOTE — Progress Notes (Signed)
PROGRESS NOTE  Carla Robles [id number] DOB: [birth year] DOA: 05/04/2016 PCP: Rachell Cipro, MD   LOS: 3 days   Brief Narrative: 82 y.o. female with a remote hx of Breast Cancer, SIADH, CAD, HTN, and Hyperlipidemia who presented to the [facility] ED with complaints of increased weakness and falls, with a fall last night in which she hit her head on the corner of a table. She was found to be hyponatremic with sodium of 120 on admission.  Assessment & Plan: Principal Problem:   Hyponatremia Active Problems:   Essential hypertension   Mild cognitive impairment   Urinary tract infection, site not specified   Pulmonary vascular congestion   SIADH (syndrome of inappropriate ADH production) ([facility])   Hypercholesteremia   Hyponatremia due to SIADH - patient's sodium worsening despite Lasix/fluid restriction and attempt at gentle hydration. Nephrology consulted 6/4, patient started on hypertonic saline and moved to SDU - Na stable overnight, starting to increase today 116 >>116 >> 121 - appreciate nephrology   Mild pulmonary edema on CXR due to acute on chronic diastolic CHF - 2D echo with normal EF, grade 2 DD - on room air - euvolemic currently   UTI - Rocephin - cultures pending, reviewed today with "multiple species, suggesting recollection". Favor treatment for 5 days. - today day 3/5  HTN - continue Lisnopril  Cognitive impairment  - continue Aricept   DVT prophylaxis: Lovenox Code Status: Full Family Communication: called husband, no answer Disposition Plan: TBD  Consultants:   Nephrology  Procedures:   None   Antimicrobials:  Ceftriaxone 6/3 >>   Subjective: - No complaints this morning, denies any chest pain, denies any shortness of breath.  - had a "rought night" being moved to ICU and having central line placed  Objective: Filed Vitals:   05/07/16 0600 05/07/16 0700 05/07/16 0739 05/07/16 0800  BP: 124/86 151/77  151/67  Pulse: 62 71  64  Temp:   98.6  F (37 C)   TempSrc:   Oral   Resp: 15 18  15   Height:      Weight:      SpO2: 96% 97%  99%    Intake/Output Summary (Last 24 hours) at 05/07/16 1107 Last data filed at 05/07/16 0800  Gross per 24 hour  Intake 818.97 ml  Output   4100 ml  Net -3281.03 ml   Filed Weights   05/04/16 2047 05/05/16 0204  Weight: 63.504 kg (140 lb) 66.497 kg (146 lb 9.6 oz)    Examination: Constitutional: NAD Filed Vitals:   05/07/16 0600 05/07/16 0700 05/07/16 0739 05/07/16 0800  BP: 124/86 151/77  151/67  Pulse: 62 71  64  Temp:   98.6 F (37 C)   TempSrc:   Oral   Resp: 15 18  15   Height:      Weight:      SpO2: 96% 97%  99%   Eyes: PERRL, lids and conjunctivae normal, Large ecchymotic area periorbital on the right Respiratory: clear to auscultation bilaterally, no wheezing, no crackles.  Cardiovascular: Regular rate and rhythm, no murmurs / rubs / gallops. No LE edema Abdomen: no tenderness. Bowel sounds positive.  Neurologic: Nonfocal Psychiatric: Alert and oriented to time and person, not to place.   Data Reviewed: I have personally reviewed following labs and imaging studies  CBC:  Recent Labs Lab 05/04/16 2025 05/05/16 0520  WBC 14.1* 12.5*  NEUTROABS 12.8*  --   HGB 12.9 11.9*  HCT 37.6 35.7*  MCV 92.2 90.2  PLT 254 99991111   Basic Metabolic Panel:  Recent Labs Lab 05/04/16 2025 05/05/16 0520 05/05/16 1342 05/06/16 0455 05/06/16 1529 05/06/16 2200 05/07/16 0246 05/07/16 0703  NA 120* 121* 120* 120* 116* 116* 116* 121*  K 4.0 4.1 3.6 3.3* 3.6  --   --   --   CL 84* 87* 82* 81* 79*  --   --   --   CO2 25 25 26 26 27   --   --   --   GLUCOSE 146* 156* 166* 158* 201*  --   --   --   BUN 13 9 12 10 11   --   --   --   CREATININE 0.84 0.73 0.85 0.63 0.86  --   --   --   CALCIUM 8.4* 7.9* 8.4* 8.2* 8.1*  --   --   --    GFR: Estimated Creatinine Clearance: 49.2 mL/min (by C-G formula based on Cr of 0.86). Liver Function Tests: No results for input(s): AST, ALT,  ALKPHOS, BILITOT, PROT, ALBUMIN in the last 168 hours. No results for input(s): LIPASE, AMYLASE in the last 168 hours. No results for input(s): AMMONIA in the last 168 hours. Coagulation Profile: No results for input(s): INR, PROTIME in the last 168 hours. Cardiac Enzymes: No results for input(s): CKTOTAL, CKMB, CKMBINDEX, TROPONINI in the last 168 hours. BNP (last 3 results) No results for input(s): PROBNP in the last 8760 hours. HbA1C: No results for input(s): HGBA1C in the last 72 hours. CBG: No results for input(s): GLUCAP in the last 168 hours. Lipid Profile: No results for input(s): CHOL, HDL, LDLCALC, TRIG, CHOLHDL, LDLDIRECT in the last 72 hours. Thyroid Function Tests: No results for input(s): TSH, T4TOTAL, FREET4, T3FREE, THYROIDAB in the last 72 hours. Anemia Panel: No results for input(s): VITAMINB12, FOLATE, FERRITIN, TIBC, IRON, RETICCTPCT in the last 72 hours. Urine analysis:    Component Value Date/Time   COLORURINE YELLOW 05/04/2016 2250   APPEARANCEUR TURBID* 05/04/2016 2250   LABSPEC 1.016 05/04/2016 2250   PHURINE 7.5 05/04/2016 2250   GLUCOSEU 100* 05/04/2016 2250   HGBUR LARGE* 05/04/2016 2250   BILIRUBINUR NEGATIVE 05/04/2016 2250   KETONESUR NEGATIVE 05/04/2016 2250   PROTEINUR 30* 05/04/2016 2250   UROBILINOGEN 0.2 08/05/2013 1850   NITRITE NEGATIVE 05/04/2016 2250   LEUKOCYTESUR LARGE* 05/04/2016 2250   Sepsis Labs: Invalid input(s): PROCALCITONIN, LACTICIDVEN  Recent Results (from the past 240 hour(s))  Urine culture     Status: Abnormal   Collection Time: 05/04/16 10:50 PM  Result Value Ref Range Status   Specimen Description URINE, CLEAN CATCH  Final   Special Requests NONE  Final   Culture MULTIPLE SPECIES PRESENT, SUGGEST RECOLLECTION (A)  Final   Report Status 05/06/2016 FINAL  Final  MRSA PCR Screening     Status: None   Collection Time: 05/07/16  6:32 AM  Result Value Ref Range Status   MRSA by PCR NEGATIVE NEGATIVE Final    Comment:         The GeneXpert MRSA Assay (FDA approved for NASAL specimens only), is one component of a comprehensive MRSA colonization surveillance program. It is not intended to diagnose MRSA infection nor to guide or monitor treatment for MRSA infections.       Radiology Studies: Dg Chest Port 1 View  05/06/2016  CLINICAL DATA:  Encounter for central line placement EXAM: PORTABLE CHEST 1 VIEW COMPARISON:  05/04/2016 FINDINGS: Left IJ central line with tip at the SVC. Normal heart size  and stable mediastinal contours when allowing for differences in technique. Low volume chest with interstitial crowding. Stable biapical pleural thickening. Postoperative changes left breast and axilla. IMPRESSION: 1. New left IJ catheter without adverse finding. 2. Low volume chest. Electronically Signed   By: Monte Fantasia M.D.   On: 05/06/2016 22:50     Scheduled Meds: . aspirin  81 mg Oral Daily  . calcium-vitamin D  1 tablet Oral Daily  . cefTRIAXone (ROCEPHIN)  IV  1 g Intravenous Q24H  . donepezil  10 mg Oral QHS  . enoxaparin (LOVENOX) injection  40 mg Subcutaneous Q24H  . labetalol  200 mg Oral BID  . multivitamin with minerals  1 tablet Oral Daily  . omega-3 acid ethyl esters  1 g Oral Daily  . sodium chloride flush  3 mL Intravenous Q12H   Continuous Infusions: . sodium chloride (hypertonic) 38 mL/hr at 05/07/16 0600     Marzetta Board, MD, PhD [facility] Pager [phone]  If 7PM-7AM, please contact night-coverage [email or web address] Password [email or web address] 05/07/2016, 11:07 AM

## 2016-05-07 NOTE — Progress Notes (Signed)
S: No new CO.  Wants restraining mitts off O:BP 124/86 mmHg  Pulse 62  Temp(Src) 98.2 F (36.8 C) (Oral)  Resp 15  Ht 5' 3.5" (1.613 m)  Wt 66.497 kg (146 lb 9.6 oz)  BMI 25.56 kg/m2  SpO2 96%  Intake/Output Summary (Last 24 hours) at 05/07/16 0726 Last data filed at 05/07/16 0600  Gross per 24 hour  Intake 792.97 ml  Output   3800 ml  Net -3007.03 ml   Weight change:  Gen: Awake and alert. Ecchymotic Rt eye/face CVS: RRR Resp: clear Abd: + BS NTND Ext: No edema NEURO: CNI, Ox3 Lt IJ triple lumen   . aspirin  81 mg Oral Daily  . calcium-vitamin D  1 tablet Oral Daily  . cefTRIAXone (ROCEPHIN)  IV  1 g Intravenous Q24H  . donepezil  10 mg Oral QHS  . enoxaparin (LOVENOX) injection  40 mg Subcutaneous Q24H  . labetalol  200 mg Oral BID  . multivitamin with minerals  1 tablet Oral Daily  . omega-3 acid ethyl esters  1 g Oral Daily  . sodium chloride flush  3 mL Intravenous Q12H   Dg Chest Port 1 View  05/06/2016  CLINICAL DATA:  Encounter for central line placement EXAM: PORTABLE CHEST 1 VIEW COMPARISON:  05/04/2016 FINDINGS: Left IJ central line with tip at the SVC. Normal heart size and stable mediastinal contours when allowing for differences in technique. Low volume chest with interstitial crowding. Stable biapical pleural thickening. Postoperative changes left breast and axilla. IMPRESSION: 1. New left IJ catheter without adverse finding. 2. Low volume chest. Electronically Signed   By: Monte Fantasia M.D.   On: 05/06/2016 22:50   BMET    Component Value Date/Time   NA 116* 05/07/2016 0246   K 3.6 05/06/2016 1529   CL 79* 05/06/2016 1529   CO2 27 05/06/2016 1529   GLUCOSE 201* 05/06/2016 1529   BUN 11 05/06/2016 1529   CREATININE 0.86 05/06/2016 1529   CALCIUM 8.1* 05/06/2016 1529   GFRNONAA >60 05/06/2016 1529   GFRAA >60 05/06/2016 1529   CBC    Component Value Date/Time   WBC 12.5* 05/05/2016 0520   RBC 3.96 05/05/2016 0520   HGB 11.9* 05/05/2016 0520    HCT 35.7* 05/05/2016 0520   PLT 238 05/05/2016 0520   MCV 90.2 05/05/2016 0520   MCH 30.1 05/05/2016 0520   MCHC 33.3 05/05/2016 0520   RDW 13.5 05/05/2016 0520   LYMPHSABS 0.6* 05/04/2016 2025   MONOABS 0.7 05/04/2016 2025   EOSABS 0.0 05/04/2016 2025   BASOSABS 0.0 05/04/2016 2025     Assessment: 1. Hyponatremia, SNa still low.  UO excellent.  On hypertonic saline  Plan: 1. Cont hypertonic saline.  SNa should start improving 2. Allergies has ACE I listed yet came in on lisinopril? 3. Cont to follow SNa   Warren Lindahl T

## 2016-05-08 LAB — RENAL FUNCTION PANEL
ALBUMIN: 2.8 g/dL — AB (ref 3.5–5.0)
Anion gap: 12 (ref 5–15)
BUN: 13 mg/dL (ref 6–20)
CALCIUM: 8.3 mg/dL — AB (ref 8.9–10.3)
CO2: 24 mmol/L (ref 22–32)
Chloride: 93 mmol/L — ABNORMAL LOW (ref 101–111)
Creatinine, Ser: 0.82 mg/dL (ref 0.44–1.00)
GFR calc Af Amer: >60 mL/min (ref >60)
GFR calc non Af Amer: >60 mL/min (ref >60)
GLUCOSE: 143 mg/dL — AB (ref 65–99)
PHOSPHORUS: 2 mg/dL — AB (ref 2.5–4.6)
POTASSIUM: 3.5 mmol/L (ref 3.5–5.1)
SODIUM: 129 mmol/L — AB (ref 135–145)

## 2016-05-08 LAB — BASIC METABOLIC PANEL
ANION GAP: 12 (ref 5–15)
BUN: 13 mg/dL (ref 6–20)
CALCIUM: 8.4 mg/dL — AB (ref 8.9–10.3)
CHLORIDE: 90 mmol/L — AB (ref 101–111)
CO2: 23 mmol/L (ref 22–32)
Creatinine, Ser: 0.86 mg/dL (ref 0.44–1.00)
GFR calc non Af Amer: >60 mL/min (ref >60)
GLUCOSE: 231 mg/dL — AB (ref 65–99)
Potassium: 3.5 mmol/L (ref 3.5–5.1)
Sodium: 125 mmol/L — ABNORMAL LOW (ref 135–145)

## 2016-05-08 LAB — SODIUM
SODIUM: 128 mmol/L — AB (ref 135–145)
Sodium: 126 mmol/L — ABNORMAL LOW (ref 135–145)

## 2016-05-08 MED ORDER — POTASSIUM & SODIUM PHOSPHATES 280-160-250 MG PO PACK
1.0000 | PACK | Freq: Three times a day (TID) | ORAL | Status: AC
  Administered 2016-05-08 – 2016-05-09 (×5): 1 via ORAL
  Filled 2016-05-08 (×6): qty 1

## 2016-05-08 MED ORDER — FUROSEMIDE 40 MG PO TABS
40.0000 mg | ORAL_TABLET | Freq: Every day | ORAL | Status: DC
  Administered 2016-05-08: 40 mg via ORAL
  Filled 2016-05-08 (×2): qty 1

## 2016-05-08 MED ORDER — HYDROCORTISONE ACE-PRAMOXINE 2.5-1 % RE CREA
TOPICAL_CREAM | Freq: Three times a day (TID) | RECTAL | Status: DC
  Filled 2016-05-08: qty 30

## 2016-05-08 MED ORDER — HYDRALAZINE HCL 20 MG/ML IJ SOLN
5.0000 mg | INTRAMUSCULAR | Status: DC | PRN
  Administered 2016-05-09 – 2016-05-15 (×2): 5 mg via INTRAVENOUS
  Filled 2016-05-08 (×2): qty 1

## 2016-05-08 NOTE — Progress Notes (Signed)
S: No new CO.  Feels better O:BP 187/86 mmHg  Pulse 79  Temp(Src) 98.3 F (36.8 C) (Oral)  Resp 16  Ht 5' 3.5" (1.613 m)  Wt 66.497 kg (146 lb 9.6 oz)  BMI 25.56 kg/m2  SpO2 100%  Intake/Output Summary (Last 24 hours) at 05/08/16 0756 Last data filed at 05/08/16 0700  Gross per 24 hour  Intake   1168 ml  Output   2035 ml  Net   -867 ml   Weight change:  Gen: Awake and alert. Ecchymotic Rt eye/face CVS: RRR Resp: clear Abd: + BS NTND Ext: No edema NEURO: CNI, Ox3 Lt IJ triple lumen   . aspirin  81 mg Oral Daily  . calcium-vitamin D  1 tablet Oral Daily  . cefTRIAXone (ROCEPHIN)  IV  1 g Intravenous Q24H  . donepezil  10 mg Oral QHS  . enoxaparin (LOVENOX) injection  40 mg Subcutaneous Q24H  . labetalol  200 mg Oral BID  . multivitamin with minerals  1 tablet Oral Daily  . omega-3 acid ethyl esters  1 g Oral Daily  . sodium chloride flush  3 mL Intravenous Q12H   Dg Chest Port 1 View  05/06/2016  CLINICAL DATA:  Encounter for central line placement EXAM: PORTABLE CHEST 1 VIEW COMPARISON:  05/04/2016 FINDINGS: Left IJ central line with tip at the SVC. Normal heart size and stable mediastinal contours when allowing for differences in technique. Low volume chest with interstitial crowding. Stable biapical pleural thickening. Postoperative changes left breast and axilla. IMPRESSION: 1. New left IJ catheter without adverse finding. 2. Low volume chest. Electronically Signed   By: Monte Fantasia M.D.   On: 05/06/2016 22:50   BMET    Component Value Date/Time   NA 129* 05/08/2016 0434   K 3.5 05/08/2016 0434   CL 93* 05/08/2016 0434   CO2 24 05/08/2016 0434   GLUCOSE 143* 05/08/2016 0434   BUN 13 05/08/2016 0434   CREATININE 0.82 05/08/2016 0434   CALCIUM 8.3* 05/08/2016 0434   GFRNONAA >60 05/08/2016 0434   GFRAA >60 05/08/2016 0434   CBC    Component Value Date/Time   WBC 12.5* 05/05/2016 0520   RBC 3.96 05/05/2016 0520   HGB 11.9* 05/05/2016 0520   HCT 35.7*  05/05/2016 0520   PLT 238 05/05/2016 0520   MCV 90.2 05/05/2016 0520   MCH 30.1 05/05/2016 0520   MCHC 33.3 05/05/2016 0520   RDW 13.5 05/05/2016 0520   LYMPHSABS 0.6* 05/04/2016 2025   MONOABS 0.7 05/04/2016 2025   EOSABS 0.0 05/04/2016 2025   BASOSABS 0.0 05/04/2016 2025     Assessment: 1. Hyponatremia, SNa improving  UO excellent.  2. HypoPO4  Plan: 1. Cont with fluid restriction 2. Replace PO4 3.   Dc q 4hr Na but will recheck later today   Emanuelle Bastos T

## 2016-05-08 NOTE — Progress Notes (Signed)
PROGRESS NOTE  Carla Robles [id number] DOB: [birth year] DOA: 05/04/2016 PCP: Rachell Cipro, MD   LOS: 4 days   Brief Narrative: 82 y.o. female with a remote hx of Breast Cancer, SIADH, CAD, HTN, and Hyperlipidemia who presented to the [facility] ED with complaints of increased weakness and falls, with a fall last night in which she hit her head on the corner of a table. She was found to be hyponatremic with sodium of 120 on admission. Patient's initial CXR showed vascular congestion and received Lasix with excellent UOP however sodium decreased further, was placed on fluid restriction with ongoing worsening. Nephrology consulted and patient transferred to SDU on 8/4 evening for hypertonic saline. With improvement her 3% Na discontinued 6/5 pm.   Assessment & Plan: Principal Problem:   Hyponatremia Active Problems:   Essential hypertension   Mild cognitive impairment   Urinary tract infection, site not specified   Pulmonary vascular congestion   SIADH (syndrome of inappropriate ADH production) (HCC)   Hypercholesteremia   Hyponatremia due to SIADH - patient's sodium worsening despite Lasix/fluid restriction and attempt at gentle hydration. Nephrology consulted 6/4, patient started on hypertonic saline and moved to SDU - Na improving, d/c hypertonic saline - appreciate nephrology, discussed with Dr. Mercy Moore this morning - remain in SDU while monitoring Na today   Mild pulmonary edema on CXR due to acute on chronic diastolic CHF - 2D echo with normal EF, grade 2 DD - on room air - euvolemic currently   UTI - Rocephin - cultures pending, reviewed today with "multiple species, suggesting recollection". Favor treatment for 5 days. - today day 4/5  HTN - continue Lisnopril  Cognitive impairment  - continue Aricept   DVT prophylaxis: Lovenox Code Status: Full Family Communication: called husband, no answer Disposition Plan: TBD  Consultants:   Nephrology  Procedures:    None   Antimicrobials:  Ceftriaxone 6/3 >>   Subjective: - confused this morning. - no chest pain, shortness of breath, no abdominal pain, nausea or vomiting.   Objective: Filed Vitals:   05/08/16 0700 05/08/16 0755 05/08/16 0800 05/08/16 0900  BP: 187/86  195/68 112/85  Pulse: 79  83 67  Temp:  98.3 F (36.8 C)  97.3 F (36.3 C)  TempSrc:  Oral  Oral  Resp: 16  15 18   Height:      Weight:      SpO2: 100%  91% 99%    Intake/Output Summary (Last 24 hours) at 05/08/16 0910 Last data filed at 05/08/16 0800  Gross per 24 hour  Intake   1144 ml  Output   2085 ml  Net   -941 ml   Filed Weights   05/04/16 2047 05/05/16 0204  Weight: 63.504 kg (140 lb) 66.497 kg (146 lb 9.6 oz)    Examination: Constitutional: NAD Filed Vitals:   05/08/16 0700 05/08/16 0755 05/08/16 0800 05/08/16 0900  BP: 187/86  195/68 112/85  Pulse: 79  83 67  Temp:  98.3 F (36.8 C)  97.3 F (36.3 C)  TempSrc:  Oral  Oral  Resp: 16  15 18   Height:      Weight:      SpO2: 100%  91% 99%   Eyes: PERRL, lids and conjunctivae normal, Large ecchymotic area periorbital on the right Respiratory: clear to auscultation bilaterally, no wheezing, no crackles.  Cardiovascular: Regular rate and rhythm, no murmurs / rubs / gallops. No LE edema Abdomen: no tenderness. Bowel sounds positive.  Neurologic: Nonfocal Psychiatric: Alert and oriented  to time and person, not to place.   Data Reviewed: I have personally reviewed following labs and imaging studies  CBC:  Recent Labs Lab 05/04/16 2025 05/05/16 0520  WBC 14.1* 12.5*  NEUTROABS 12.8*  --   HGB 12.9 11.9*  HCT 37.6 35.7*  MCV 92.2 90.2  PLT 254 99991111   Basic Metabolic Panel:  Recent Labs Lab 05/05/16 0520 05/05/16 1342 05/06/16 0455 05/06/16 1529  05/07/16 1503 05/07/16 2042 05/08/16 0011 05/08/16 0434 05/08/16 0748  NA 121* 120* 120* 116*  < > 125* 126* 128* 129* 126*  K 4.1 3.6 3.3* 3.6  --   --   --   --  3.5  --   CL 87*  82* 81* 79*  --   --   --   --  93*  --   CO2 25 26 26 27   --   --   --   --  24  --   GLUCOSE 156* 166* 158* 201*  --   --   --   --  143*  --   BUN 9 12 10 11   --   --   --   --  13  --   CREATININE 0.73 0.85 0.63 0.86  --   --   --   --  0.82  --   CALCIUM 7.9* 8.4* 8.2* 8.1*  --   --   --   --  8.3*  --   PHOS  --   --   --   --   --   --   --   --  2.0*  --   < > = values in this interval not displayed. GFR: Estimated Creatinine Clearance: 51.6 mL/min (by C-G formula based on Cr of 0.82). Liver Function Tests:  Recent Labs Lab 05/08/16 0434  ALBUMIN 2.8*   No results for input(s): LIPASE, AMYLASE in the last 168 hours. No results for input(s): AMMONIA in the last 168 hours. Coagulation Profile: No results for input(s): INR, PROTIME in the last 168 hours. Cardiac Enzymes: No results for input(s): CKTOTAL, CKMB, CKMBINDEX, TROPONINI in the last 168 hours. BNP (last 3 results) No results for input(s): PROBNP in the last 8760 hours. HbA1C: No results for input(s): HGBA1C in the last 72 hours. CBG: No results for input(s): GLUCAP in the last 168 hours. Lipid Profile: No results for input(s): CHOL, HDL, LDLCALC, TRIG, CHOLHDL, LDLDIRECT in the last 72 hours. Thyroid Function Tests: No results for input(s): TSH, T4TOTAL, FREET4, T3FREE, THYROIDAB in the last 72 hours. Anemia Panel: No results for input(s): VITAMINB12, FOLATE, FERRITIN, TIBC, IRON, RETICCTPCT in the last 72 hours. Urine analysis:    Component Value Date/Time   COLORURINE YELLOW 05/04/2016 2250   APPEARANCEUR TURBID* 05/04/2016 2250   LABSPEC 1.016 05/04/2016 2250   PHURINE 7.5 05/04/2016 2250   GLUCOSEU 100* 05/04/2016 2250   HGBUR LARGE* 05/04/2016 2250   BILIRUBINUR NEGATIVE 05/04/2016 2250   KETONESUR NEGATIVE 05/04/2016 2250   PROTEINUR 30* 05/04/2016 2250   UROBILINOGEN 0.2 08/05/2013 1850   NITRITE NEGATIVE 05/04/2016 2250   LEUKOCYTESUR LARGE* 05/04/2016 2250   Sepsis Labs: Invalid input(s):  PROCALCITONIN, LACTICIDVEN  Recent Results (from the past 240 hour(s))  Urine culture     Status: Abnormal   Collection Time: 05/04/16 10:50 PM  Result Value Ref Range Status   Specimen Description URINE, CLEAN CATCH  Final   Special Requests NONE  Final   Culture MULTIPLE SPECIES PRESENT, SUGGEST RECOLLECTION (  A)  Final   Report Status 05/06/2016 FINAL  Final  Culture, Urine     Status: None   Collection Time: 05/06/16  4:06 PM  Result Value Ref Range Status   Specimen Description URINE, RANDOM  Final   Special Requests Normal  Final   Culture NO GROWTH  Final   Report Status 05/07/2016 FINAL  Final  MRSA PCR Screening     Status: None   Collection Time: 05/07/16  6:32 AM  Result Value Ref Range Status   MRSA by PCR NEGATIVE NEGATIVE Final    Comment:        The GeneXpert MRSA Assay (FDA approved for NASAL specimens only), is one component of a comprehensive MRSA colonization surveillance program. It is not intended to diagnose MRSA infection nor to guide or monitor treatment for MRSA infections.       Radiology Studies: Dg Chest Port 1 View  05/06/2016  CLINICAL DATA:  Encounter for central line placement EXAM: PORTABLE CHEST 1 VIEW COMPARISON:  05/04/2016 FINDINGS: Left IJ central line with tip at the SVC. Normal heart size and stable mediastinal contours when allowing for differences in technique. Low volume chest with interstitial crowding. Stable biapical pleural thickening. Postoperative changes left breast and axilla. IMPRESSION: 1. New left IJ catheter without adverse finding. 2. Low volume chest. Electronically Signed   By: Monte Fantasia M.D.   On: 05/06/2016 22:50     Scheduled Meds: . aspirin  81 mg Oral Daily  . calcium-vitamin D  1 tablet Oral Daily  . cefTRIAXone (ROCEPHIN)  IV  1 g Intravenous Q24H  . donepezil  10 mg Oral QHS  . enoxaparin (LOVENOX) injection  40 mg Subcutaneous Q24H  . labetalol  200 mg Oral BID  . multivitamin with minerals  1  tablet Oral Daily  . omega-3 acid ethyl esters  1 g Oral Daily  . potassium & sodium phosphates  1 packet Oral TID WC & HS  . sodium chloride flush  3 mL Intravenous Q12H   Continuous Infusions:     Marzetta Board, MD, PhD [facility] Pager [phone]  If 7PM-7AM, please contact night-coverage [email or web address] Password [email or web address] 05/08/2016, 9:10 AM

## 2016-05-08 NOTE — Progress Notes (Signed)
NP Baltazar Najjar made aware of pts BP trending up throughout the night. Last BP 187/85. Will monitor.

## 2016-05-09 DIAGNOSIS — I10 Essential (primary) hypertension: Secondary | ICD-10-CM

## 2016-05-09 DIAGNOSIS — G3184 Mild cognitive impairment, so stated: Secondary | ICD-10-CM

## 2016-05-09 DIAGNOSIS — E871 Hypo-osmolality and hyponatremia: Secondary | ICD-10-CM

## 2016-05-09 LAB — BASIC METABOLIC PANEL
ANION GAP: 10 (ref 5–15)
BUN: 14 mg/dL (ref 6–20)
CHLORIDE: 83 mmol/L — AB (ref 101–111)
CO2: 29 mmol/L (ref 22–32)
CREATININE: 0.97 mg/dL (ref 0.44–1.00)
Calcium: 8.4 mg/dL — ABNORMAL LOW (ref 8.9–10.3)
GFR calc non Af Amer: 54 mL/min — ABNORMAL LOW (ref >60)
Glucose, Bld: 179 mg/dL — ABNORMAL HIGH (ref 65–99)
Potassium: 3.7 mmol/L (ref 3.5–5.1)
SODIUM: 122 mmol/L — AB (ref 135–145)

## 2016-05-09 LAB — RENAL FUNCTION PANEL
ALBUMIN: 3 g/dL — AB (ref 3.5–5.0)
ANION GAP: 11 (ref 5–15)
BUN: 9 mg/dL (ref 6–20)
CALCIUM: 8.4 mg/dL — AB (ref 8.9–10.3)
CO2: 32 mmol/L (ref 22–32)
Chloride: 83 mmol/L — ABNORMAL LOW (ref 101–111)
Creatinine, Ser: 0.83 mg/dL (ref 0.44–1.00)
GLUCOSE: 134 mg/dL — AB (ref 65–99)
PHOSPHORUS: 2.8 mg/dL (ref 2.5–4.6)
Potassium: 3 mmol/L — ABNORMAL LOW (ref 3.5–5.1)
SODIUM: 126 mmol/L — AB (ref 135–145)

## 2016-05-09 LAB — CBC
HCT: 31.3 % — ABNORMAL LOW (ref 36.0–46.0)
HEMOGLOBIN: 10.6 g/dL — AB (ref 12.0–15.0)
MCH: 30 pg (ref 26.0–34.0)
MCHC: 33.9 g/dL (ref 30.0–36.0)
MCV: 88.7 fL (ref 78.0–100.0)
Platelets: 267 10*3/uL (ref 150–400)
RBC: 3.53 MIL/uL — AB (ref 3.87–5.11)
RDW: 13.6 % (ref 11.5–15.5)
WBC: 10.9 10*3/uL — AB (ref 4.0–10.5)

## 2016-05-09 MED ORDER — SODIUM CHLORIDE 0.9 % IV BOLUS (SEPSIS): 500.0000 mL | Freq: Once | INTRAVENOUS | Status: DC

## 2016-05-09 MED ORDER — POTASSIUM CHLORIDE CRYS ER 20 MEQ PO TBCR
40.0000 meq | EXTENDED_RELEASE_TABLET | Freq: Two times a day (BID) | ORAL | Status: DC
  Administered 2016-05-09 – 2016-05-15 (×13): 40 meq via ORAL
  Filled 2016-05-09 (×13): qty 2

## 2016-05-09 MED ORDER — FUROSEMIDE 20 MG PO TABS
20.0000 mg | ORAL_TABLET | Freq: Every day | ORAL | Status: DC
  Administered 2016-05-09 – 2016-05-12 (×4): 20 mg via ORAL
  Filled 2016-05-09 (×4): qty 1

## 2016-05-09 NOTE — Progress Notes (Signed)
S: Confabulating O:BP 99/85 mmHg  Pulse 63  Temp(Src) 98.3 F (36.8 C) (Oral)  Resp 16  Ht 5' 3.5" (1.613 m)  Wt 66.497 kg (146 lb 9.6 oz)  BMI 25.56 kg/m2  SpO2 98%  Intake/Output Summary (Last 24 hours) at 05/09/16 0807 Last data filed at 05/09/16 0300  Gross per 24 hour  Intake    940 ml  Output   2550 ml  Net  -1610 ml   Weight change:  Gen: Awake and alert. Ecchymotic Rt eye/face CVS: RRR Resp: clear Abd: + BS NTND Ext: No edema NEURO: CNI Ox1, confabulating Lt IJ triple lumen   . aspirin  81 mg Oral Daily  . calcium-vitamin D  1 tablet Oral Daily  . cefTRIAXone (ROCEPHIN)  IV  1 g Intravenous Q24H  . donepezil  10 mg Oral QHS  . enoxaparin (LOVENOX) injection  40 mg Subcutaneous Q24H  . furosemide  40 mg Oral Daily  . labetalol  200 mg Oral BID  . multivitamin with minerals  1 tablet Oral Daily  . omega-3 acid ethyl esters  1 g Oral Daily  . potassium & sodium phosphates  1 packet Oral TID WC & HS  . sodium chloride flush  3 mL Intravenous Q12H   No results found. BMET    Component Value Date/Time   NA 126* 05/09/2016 0543   K 3.0* 05/09/2016 0543   CL 83* 05/09/2016 0543   CO2 32 05/09/2016 0543   GLUCOSE 134* 05/09/2016 0543   BUN 9 05/09/2016 0543   CREATININE 0.83 05/09/2016 0543   CALCIUM 8.4* 05/09/2016 0543   GFRNONAA >60 05/09/2016 0543   GFRAA >60 05/09/2016 0543   CBC    Component Value Date/Time   WBC 10.9* 05/09/2016 0543   RBC 3.53* 05/09/2016 0543   HGB 10.6* 05/09/2016 0543   HCT 31.3* 05/09/2016 0543   PLT 267 05/09/2016 0543   MCV 88.7 05/09/2016 0543   MCH 30.0 05/09/2016 0543   MCHC 33.9 05/09/2016 0543   RDW 13.6 05/09/2016 0543   LYMPHSABS 0.6* 05/04/2016 2025   MONOABS 0.7 05/04/2016 2025   EOSABS 0.0 05/04/2016 2025   BASOSABS 0.0 05/04/2016 2025     Assessment: 1. Hyponatremia, SNa improving  UO excellent.  2. HypoPO4, improved 3. Hypokalemia  Plan: 1. PO4 replacement will stop after today 2. Replace K 3.  Decrease lasix to 20mg  q d 4. Daily labs  Carla Robles T

## 2016-05-09 NOTE — Progress Notes (Signed)
Pt removed foley her self. Pt confused, mittens placed on pt and NP made aware. Foley D/C'd.   0630- Pt had post foley void.

## 2016-05-09 NOTE — Progress Notes (Addendum)
PROGRESS NOTE  Carla Robles [id number] DOB: 1934-04-02 DOA: 05/04/2016 PCP: Rachell Cipro, MD   LOS: 5 days   Brief Narrative: 82 y.o. female with a remote hx of Breast Cancer, SIADH, CAD, HTN, and Hyperlipidemia who presented to the [facility] ED with complaints of increased weakness and falls, with a fall last night in which she hit her head on the corner of a table. She was found to be hyponatremic with sodium of 120 on admission. Patient's initial CXR showed vascular congestion and received Lasix with excellent UOP however sodium decreased further, was placed on fluid restriction with ongoing worsening. Nephrology consulted and patient transferred to SDU on 8/4 evening for hypertonic saline. With improvement her 3% Na discontinued 6/5 pm.   Assessment & Plan: Principal Problem:   Hyponatremia Active Problems:   Essential hypertension   Mild cognitive impairment   Urinary tract infection, site not specified   Pulmonary vascular congestion   SIADH (syndrome of inappropriate ADH production) (HCC)   Hypercholesteremia   Hyponatremia due to SIADH - patient's sodium worsening despite Lasix/fluid restriction and attempt at gentle hydration. Nephrology consulted 6/4, patient started on hypertonic saline and moved to SDU - Na improving, d/c hypertonic saline - appreciate nephrology, discussed with Dr. Mercy Moore this morning - remain in SDU while monitoring Na today  - her sodium is 126 today.   Mild pulmonary edema on CXR due to acute on chronic diastolic CHF - 2D echo with normal EF, grade 2 DD - on room air - euvolemic currently   UTI - Rocephin - cultures pending, reviewed today with "multiple species, suggesting recollection". Favor treatment for 5 days. -complete the treatment by tonight.   Hypotension: Discontinued the labetalol and  Lisnopril  Cognitive impairment  - Aricept   Hypokalemia  replete as needed and repeat in am.   DVT prophylaxis: Lovenox Code Status:  Full Family Communication: discussed with husband on the phone.  Disposition Plan: TBD pending PT eval.   Consultants:   Nephrology  Procedures:   None   Antimicrobials:  Ceftriaxone 6/3 >>   Subjective: - confused this morning. Very sleepy.  Husband and RN told me that she didn't sleep all night.   Objective: Filed Vitals:   05/09/16 1300 05/09/16 1320 05/09/16 1400 05/09/16 1500  BP: 65/36 96/39 91/75  120/66  Pulse: 62 64 65 65  Temp:    97.8 F (36.6 C)  TempSrc:    Oral  Resp: 11 15 21 11   Height:      Weight:      SpO2: 82% 97% 98% 98%    Intake/Output Summary (Last 24 hours) at 05/09/16 1613 Last data filed at 05/09/16 1500  Gross per 24 hour  Intake   1060 ml  Output   2750 ml  Net  -1690 ml   Filed Weights   05/04/16 2047 05/05/16 0204  Weight: 63.504 kg (140 lb) 66.497 kg (146 lb 9.6 oz)    Examination: Constitutional: NAD Filed Vitals:   05/09/16 1300 05/09/16 1320 05/09/16 1400 05/09/16 1500  BP: 65/36 96/39 91/75  120/66  Pulse: 62 64 65 65  Temp:    97.8 F (36.6 C)  TempSrc:    Oral  Resp: 11 15 21 11   Height:      Weight:      SpO2: 82% 97% 98% 98%   Eyes: PERRL,large ecchymotic area periorbital on the right Respiratory: clear to auscultation bilaterally, no wheezing, no crackles.  Cardiovascular: Regular rate and rhythm, no murmurs / rubs /  gallops. No LE edema Abdomen: no tenderness. Bowel sounds positive.  Neurologic: Nonfocal Psychiatric: Alert and oriented to time and person, not to place.   Data Reviewed: I have personally reviewed following labs and imaging studies  CBC:  Recent Labs Lab 05/04/16 2025 05/05/16 0520 05/09/16 0543  WBC 14.1* 12.5* 10.9*  NEUTROABS 12.8*  --   --   HGB 12.9 11.9* 10.6*  HCT 37.6 35.7* 31.3*  MCV 92.2 90.2 88.7  PLT 254 238 [id number]   Basic Metabolic Panel:  Recent Labs Lab 05/06/16 0455 05/06/16 1529  05/08/16 0011 05/08/16 0434 05/08/16 0748 05/08/16 1438 05/09/16 0543  NA  120* 116*  < > 128* 129* 126* 125* 126*  K 3.3* 3.6  --   --  3.5  --  3.5 3.0*  CL 81* 79*  --   --  93*  --  90* 83*  CO2 26 27  --   --  24  --  23 32  GLUCOSE 158* 201*  --   --  143*  --  231* 134*  BUN 10 11  --   --  13  --  13 9  CREATININE 0.63 0.86  --   --  0.82  --  0.86 0.83  CALCIUM 8.2* 8.1*  --   --  8.3*  --  8.4* 8.4*  PHOS  --   --   --   --  2.0*  --   --  2.8  < > = values in this interval not displayed. GFR: Estimated Creatinine Clearance: 51 mL/min (by C-G formula based on Cr of 0.83). Liver Function Tests:  Recent Labs Lab 05/08/16 0434 05/09/16 0543  ALBUMIN 2.8* 3.0*   No results for input(s): LIPASE, AMYLASE in the last 168 hours. No results for input(s): AMMONIA in the last 168 hours. Coagulation Profile: No results for input(s): INR, PROTIME in the last 168 hours. Cardiac Enzymes: No results for input(s): CKTOTAL, CKMB, CKMBINDEX, TROPONINI in the last 168 hours. BNP (last 3 results) No results for input(s): PROBNP in the last 8760 hours. HbA1C: No results for input(s): HGBA1C in the last 72 hours. CBG: No results for input(s): GLUCAP in the last 168 hours. Lipid Profile: No results for input(s): CHOL, HDL, LDLCALC, TRIG, CHOLHDL, LDLDIRECT in the last 72 hours. Thyroid Function Tests: No results for input(s): TSH, T4TOTAL, FREET4, T3FREE, THYROIDAB in the last 72 hours. Anemia Panel: No results for input(s): VITAMINB12, FOLATE, FERRITIN, TIBC, IRON, RETICCTPCT in the last 72 hours. Urine analysis:    Component Value Date/Time   COLORURINE YELLOW 05/04/2016 2250   APPEARANCEUR TURBID* 05/04/2016 2250   LABSPEC 1.016 05/04/2016 2250   PHURINE 7.5 05/04/2016 2250   GLUCOSEU 100* 05/04/2016 2250   HGBUR LARGE* 05/04/2016 2250   BILIRUBINUR NEGATIVE 05/04/2016 2250   KETONESUR NEGATIVE 05/04/2016 2250   PROTEINUR 30* 05/04/2016 2250   UROBILINOGEN 0.2 08/05/2013 1850   NITRITE NEGATIVE 05/04/2016 2250   LEUKOCYTESUR LARGE* 05/04/2016 2250     Sepsis Labs: Invalid input(s): PROCALCITONIN, LACTICIDVEN  Recent Results (from the past 240 hour(s))  Urine culture     Status: Abnormal   Collection Time: 05/04/16 10:50 PM  Result Value Ref Range Status   Specimen Description URINE, CLEAN CATCH  Final   Special Requests NONE  Final   Culture MULTIPLE SPECIES PRESENT, SUGGEST RECOLLECTION (A)  Final   Report Status 05/06/2016 FINAL  Final  Culture, Urine     Status: None   Collection Time: 05/06/16  4:06 PM  Result Value Ref Range Status   Specimen Description URINE, RANDOM  Final   Special Requests Normal  Final   Culture NO GROWTH  Final   Report Status 05/07/2016 FINAL  Final  MRSA PCR Screening     Status: None   Collection Time: 05/07/16  6:32 AM  Result Value Ref Range Status   MRSA by PCR NEGATIVE NEGATIVE Final    Comment:        The GeneXpert MRSA Assay (FDA approved for NASAL specimens only), is one component of a comprehensive MRSA colonization surveillance program. It is not intended to diagnose MRSA infection nor to guide or monitor treatment for MRSA infections.       Radiology Studies: No results found.   Scheduled Meds: . aspirin  81 mg Oral Daily  . calcium-vitamin D  1 tablet Oral Daily  . cefTRIAXone (ROCEPHIN)  IV  1 g Intravenous Q24H  . donepezil  10 mg Oral QHS  . enoxaparin (LOVENOX) injection  40 mg Subcutaneous Q24H  . furosemide  20 mg Oral Daily  . labetalol  200 mg Oral BID  . multivitamin with minerals  1 tablet Oral Daily  . omega-3 acid ethyl esters  1 g Oral Daily  . potassium & sodium phosphates  1 packet Oral TID WC & HS  . potassium chloride  40 mEq Oral BID  . sodium chloride  500 mL Intravenous Once  . sodium chloride flush  3 mL Intravenous Q12H   Continuous Infusions:     Hosie Poisson, MD,  [facility] Pager [phone]  If 7PM-7AM, please contact night-coverage [email or web address] Password [facility] 05/09/2016, 4:13 PM

## 2016-05-09 NOTE — Progress Notes (Signed)
Patients BP 65/36. MD notified and ordered 500 cc bolus of NS. Patients BP now 91/75. Will continue to monitor.

## 2016-05-09 NOTE — Progress Notes (Signed)
Inpatient Diabetes Program Recommendations  AACE/ADA: New Consensus Statement on Inpatient Glycemic Control (2015)  Target Ranges:  Prepandial:   less than 140 mg/dL      Peak postprandial:   less than 180 mg/dL (1-2 hours)      Critically ill patients:  140 - 180 mg/dL   Lab Results  Component Value Date   HGBA1C 6.1* 06/24/2011    Review of Glycemic Control  Inpatient Diabetes Program Recommendations:  HgbA1C: order to assess prehospital glucose control  Consider monitoring CBGs achs and add Novolog sensitive scale if needed. Thank you  Raoul Pitch MSN, RN,CDE Inpatient Diabetes Coordinator [phone] (team pager)

## 2016-05-09 NOTE — Consult Note (Signed)
   [facility] CM Inpatient Consult   05/09/2016  Carla Robles [birth year] [id number]  Patient screened for potential [place] Management services. Patient is eligible for [facility] Care Management services under patient's Medicare  plan. Elnoria Howard review that the patient is still confused and disposition is currently not noted.    Please place a [facility] Care Management consult if community care management needs are identified or for questions contact:   Natividad Brood, RN BSN [place] [facility] Liaison  [phone] business mobile phone Toll free office [phone]

## 2016-05-10 DIAGNOSIS — R0989 Other specified symptoms and signs involving the circulatory and respiratory systems: Secondary | ICD-10-CM

## 2016-05-10 LAB — RENAL FUNCTION PANEL
Albumin: 2.8 g/dL — ABNORMAL LOW (ref 3.5–5.0)
Anion gap: 7 (ref 5–15)
BUN: 13 mg/dL (ref 6–20)
CHLORIDE: 87 mmol/L — AB (ref 101–111)
CO2: 29 mmol/L (ref 22–32)
CREATININE: 0.82 mg/dL (ref 0.44–1.00)
Calcium: 8.5 mg/dL — ABNORMAL LOW (ref 8.9–10.3)
GFR calc non Af Amer: >60 mL/min (ref >60)
GLUCOSE: 127 mg/dL — AB (ref 65–99)
Phosphorus: 3.4 mg/dL (ref 2.5–4.6)
Potassium: 3.9 mmol/L (ref 3.5–5.1)
Sodium: 123 mmol/L — ABNORMAL LOW (ref 135–145)

## 2016-05-10 LAB — BASIC METABOLIC PANEL
ANION GAP: 10 (ref 5–15)
BUN: 12 mg/dL (ref 6–20)
CHLORIDE: 89 mmol/L — AB (ref 101–111)
CO2: 29 mmol/L (ref 22–32)
CREATININE: 0.95 mg/dL (ref 0.44–1.00)
Calcium: 9 mg/dL (ref 8.9–10.3)
GFR calc non Af Amer: 55 mL/min — ABNORMAL LOW (ref >60)
Glucose, Bld: 141 mg/dL — ABNORMAL HIGH (ref 65–99)
Potassium: 4 mmol/L (ref 3.5–5.1)
Sodium: 128 mmol/L — ABNORMAL LOW (ref 135–145)

## 2016-05-10 MED ORDER — DEMECLOCYCLINE HCL 150 MG PO TABS
150.0000 mg | ORAL_TABLET | Freq: Two times a day (BID) | ORAL | Status: DC
  Administered 2016-05-10 – 2016-05-14 (×9): 150 mg via ORAL
  Filled 2016-05-10 (×10): qty 1

## 2016-05-10 NOTE — Care Management Important Message (Signed)
Important Message  Patient Details  Name: Carla Robles MRN: [id number] Date of Birth: 1934-08-12   Medicare Important Message Given:  Yes    [name] May 05/10/2016, 10:13 AM

## 2016-05-10 NOTE — Evaluation (Signed)
Physical Therapy Evaluation Patient Details Name: Carla Robles MRN: [id number] DOB: 1934-04-17 Today's Date: 05/10/2016   History of Present Illness  Pt is a 82 y/o F who presented to the ED w/ c/o increased weakness and falls w/ a fall last night after running into the corner of the door when leaving the bathroom w/ the lights out.  She was found to be hyponatremic on admission.  Pt's PMH includes dementia, breast cancer and breast lumpectomy, NSTEMI, SIADH.    Clinical Impression  Pt admitted with above diagnosis. Pt currently with functional limitations due to the deficits listed below (see PT Problem List). Carla Robles was Ind PTA, although relying on her husband to drive due to her dementia at baseline.  She currently requires up to min assist for safe transfers and ambulation due to being tremulous when standing.  Improved w/ introduction of RW and ambulation. Husband will be available to provide 24/7 assist/supervision at d/c w/ support of other family members. Pt will benefit from skilled PT to increase their independence and safety with mobility to allow discharge to the venue listed below.      Follow Up Recommendations Home health PT;Supervision/Assistance - 24 hour    Equipment Recommendations  None recommended by PT    Recommendations for Other Services OT consult     Precautions / Restrictions Precautions Precautions: Fall Restrictions Weight Bearing Restrictions: No      Mobility  Bed Mobility               General bed mobility comments: Pt sitting in recilner chair upon PT arrival  Transfers Overall transfer level: Needs assistance Equipment used: Rolling walker (2 wheeled);None Transfers: Sit to/from Stand Sit to Stand: Min guard;Min assist         General transfer comment: First attempt sit>stand w/o AD and pt reaching out for side of bed and armrest for support.  RW introduced and cues provided for hand placement and pt requiring min guard assist.  Pt  unsteady and shaking upon standing which improves w/ ambulation.  Ambulation/Gait Ambulation/Gait assistance: Min guard;Min assist Ambulation Distance (Feet): 120 Feet Assistive device: Rolling walker (2 wheeled) Gait Pattern/deviations: Step-through pattern;Decreased stride length   Gait velocity interpretation: at or above normal speed for age/gender General Gait Details: Cues for correct positioning of RW as pt pushing too far ahead.  Pt shaking initially which improves w/ ambulatory distance.  Min guard for safety although requires min assist when pt coughs while ambulating (pt w/ stress incontinence).  Stairs            Wheelchair Mobility    Modified Rankin (Stroke Patients Only)       Balance Overall balance assessment: Needs assistance;History of Falls Sitting-balance support: No upper extremity supported;Feet supported Sitting balance-Leahy Scale: Fair Sitting balance - Comments: Supervision for safety while on BSC   Standing balance support: Bilateral upper extremity supported;During functional activity Standing balance-Leahy Scale: Poor Standing balance comment: Relies on UE support                             Pertinent Vitals/Pain Pain Assessment: No/denies pain    Home Living Family/patient expects to be discharged to:: Private residence Living Arrangements: Spouse/significant other Available Help at Discharge: Family;Available 24 hours/day Type of Home: House Home Access: Stairs to enter Entrance Stairs-Rails: Right Entrance Stairs-Number of Steps: 2 Home Layout: Two level;Able to live on main level with bedroom/bathroom Home Equipment: Grab bars -  tub/shower;Walker - 2 wheels;Crutches;Cane - single point Additional Comments: Husband has arranged to have everything moved downstairs so pt and husband can stay in downstairs bedroom    Prior Function Level of Independence: Independent         Comments: Pt beginning to drive less over the  past several months due to dementia. Husband reports pt has not had any previous falls and pt is normally very independent.  Pt used to be a PT working in the school system.     Hand Dominance   Dominant Hand: Right    Extremity/Trunk Assessment   Upper Extremity Assessment: Defer to OT evaluation           Lower Extremity Assessment: Overall WFL for tasks assessed         Communication   Communication: No difficulties  Cognition Arousal/Alertness: Awake/alert Behavior During Therapy: WFL for tasks assessed/performed Overall Cognitive Status: History of cognitive impairments - at baseline (pt looking to husband to answer questions)                      General Comments General comments (skin integrity, edema, etc.): Ecchymosis Rt side of face.      Exercises General Exercises - Lower Extremity Ankle Circles/Pumps: AROM;Both;10 reps;Seated Long Arc Quad: AROM;Both;10 reps;Seated      Assessment/Plan    PT Assessment Patient needs continued PT services  PT Diagnosis Difficulty walking   PT Problem List Decreased activity tolerance;Decreased balance;Decreased cognition;Decreased knowledge of use of DME;Decreased safety awareness  PT Treatment Interventions DME instruction;Gait training;Stair training;Functional mobility training;Therapeutic activities;Therapeutic exercise;Balance training;Cognitive remediation;Patient/family education   PT Goals (Current goals can be found in the Care Plan section) Acute Rehab PT Goals Patient Stated Goal: per husband, to return home PT Goal Formulation: With patient/family Time For Goal Achievement: 05/24/16 Potential to Achieve Goals: Good    Frequency Min 3X/week   Barriers to discharge Inaccessible home environment steps to enter home    Co-evaluation               End of Session Equipment Utilized During Treatment: Gait belt Activity Tolerance: Patient tolerated treatment well Patient left: in chair;with  call bell/phone within reach;with chair alarm set Nurse Communication: Mobility status         Time: [id number] PT Time Calculation (min) (ACUTE ONLY): 32 min   Charges:   PT Evaluation $PT Eval Moderate Complexity: 1 Procedure PT Treatments $Gait Training: 8-22 mins   PT G Codes:       Carla Robles PT, DPT  Pager: [phone] Phone: [phone] 05/10/2016, 11:46 AM

## 2016-05-10 NOTE — Progress Notes (Signed)
S: Mentally much better.  Able to carry on conversation O:BP 120/53 mmHg  Pulse 60  Temp(Src) 97.6 F (36.4 C) (Oral)  Resp 17  Ht 5' 3.5" (1.613 m)  Wt 66.497 kg (146 lb 9.6 oz)  BMI 25.56 kg/m2  SpO2 94%  Intake/Output Summary (Last 24 hours) at 05/10/16 0750 Last data filed at 05/10/16 0616  Gross per 24 hour  Intake   1560 ml  Output   1450 ml  Net    110 ml   Weight change:  Gen: Awake and alert. Ecchymotic Rt eye/face CVS: RRR Resp: clear Abd: + BS NTND Ext: No edema NEURO: CNI Ox3 today Lt IJ triple lumen   . aspirin  81 mg Oral Daily  . calcium-vitamin D  1 tablet Oral Daily  . cefTRIAXone (ROCEPHIN)  IV  1 g Intravenous Q24H  . donepezil  10 mg Oral QHS  . enoxaparin (LOVENOX) injection  40 mg Subcutaneous Q24H  . furosemide  20 mg Oral Daily  . multivitamin with minerals  1 tablet Oral Daily  . omega-3 acid ethyl esters  1 g Oral Daily  . potassium chloride  40 mEq Oral BID  . sodium chloride  500 mL Intravenous Once  . sodium chloride flush  3 mL Intravenous Q12H   No results found. BMET    Component Value Date/Time   NA 123* 05/10/2016 0500   K 3.9 05/10/2016 0500   CL 87* 05/10/2016 0500   CO2 29 05/10/2016 0500   GLUCOSE 127* 05/10/2016 0500   BUN 13 05/10/2016 0500   CREATININE 0.82 05/10/2016 0500   CALCIUM 8.5* 05/10/2016 0500   GFRNONAA >60 05/10/2016 0500   GFRAA >60 05/10/2016 0500   CBC    Component Value Date/Time   WBC 10.9* 05/09/2016 0543   RBC 3.53* 05/09/2016 0543   HGB 10.6* 05/09/2016 0543   HCT 31.3* 05/09/2016 0543   PLT 267 05/09/2016 0543   MCV 88.7 05/09/2016 0543   MCH 30.0 05/09/2016 0543   MCHC 33.9 05/09/2016 0543   RDW 13.6 05/09/2016 0543   LYMPHSABS 0.6* 05/04/2016 2025   MONOABS 0.7 05/04/2016 2025   EOSABS 0.0 05/04/2016 2025   BASOSABS 0.0 05/04/2016 2025     Assessment: 1. Hyponatremia, SNa sl lower after received IV fluids yest  UO excellent.  2. HypoPO4, improved 3. Hypokalemia,  improved  Plan: 1. Tried to order tolvaptan but restricted by hospital protocol which is ridiculous 2.  Will start demeclocycline 3.  Daily Scr  Jacara Benito T

## 2016-05-10 NOTE — Progress Notes (Signed)
PROGRESS NOTE  Carla Robles [id number] DOB: 11-29-1934 DOA: 05/04/2016 PCP: Rachell Cipro, MD   LOS: 6 days   Brief Narrative: 82 y.o. female with a remote hx of Breast Cancer, SIADH, CAD, HTN, and Hyperlipidemia who presented to the [facility] ED with complaints of increased weakness and falls, with a fall last night in which she hit her head on the corner of a table. She was found to be hyponatremic with sodium of 120 on admission. Patient's initial CXR showed vascular congestion and received Lasix with excellent UOP however sodium decreased further, was placed on fluid restriction with ongoing worsening. Nephrology consulted and patient transferred to SDU on 8/4 evening for hypertonic saline. With improvement her 3% Na discontinued 6/5 pm.   Assessment & Plan: Principal Problem:   Hyponatremia Active Problems:   Essential hypertension   Mild cognitive impairment   Urinary tract infection, site not specified   Pulmonary vascular congestion   SIADH (syndrome of inappropriate ADH production) (HCC)   Hypercholesteremia   Hyponatremia due to SIADH - patient's sodium worsening despite Lasix/fluid restriction and attempt at gentle hydration. Nephrology consulted 6/4, patient started on hypertonic saline and moved to SDU - Na improving, d/c hypertonic saline - appreciate nephrology, discussed with Dr. Mercy Moore  - remain in SDU while monitoring Na , on 6/8 pts blood pressure was low and patient had a presyncopal episode and she received a 559ml bolus. Her sodium dropped further to 123 , but her mental status improved today.   Mild pulmonary edema on CXR due to acute on chronic diastolic CHF - 2D echo with normal EF, grade 2 DD - on room air - euvolemic currently   UTI - Rocephin - cultures pending, reviewed today with "multiple species, suggesting recollection". Favor treatment for 5 days. -complete the treatment   Hypotension: Discontinued the labetalol and  Lisnopril  Cognitive  impairment  - Aricept   Hypokalemia  replete as needed and repeat in am.   DVT prophylaxis: Lovenox Code Status: Full Family Communication: discussed with husband on the phone.  Disposition Plan: TBD pending PT eval.   Consultants:   Nephrology  Procedures:   None   Antimicrobials:  Ceftriaxone 6/3 >>   Subjective: aler sitting in the chair .   Objective: Filed Vitals:   05/10/16 0441 05/10/16 0800 05/10/16 1200 05/10/16 1612  BP: 120/53 148/102 123/61 108/78  Pulse: 60 65 66 75  Temp: 97.6 F (36.4 C) 97.8 F (36.6 C) 97.8 F (36.6 C) 97.9 F (36.6 C)  TempSrc: Oral Oral Oral Oral  Resp: 17 15 19 14   Height:      Weight:      SpO2: 94% 94% 95% 97%    Intake/Output Summary (Last 24 hours) at 05/10/16 1756 Last data filed at 05/10/16 1300  Gross per 24 hour  Intake    895 ml  Output   1300 ml  Net   -405 ml   Filed Weights   05/04/16 2047 05/05/16 0204  Weight: 63.504 kg (140 lb) 66.497 kg (146 lb 9.6 oz)    Examination: Constitutional: NAD Filed Vitals:   05/10/16 0441 05/10/16 0800 05/10/16 1200 05/10/16 1612  BP: 120/53 148/102 123/61 108/78  Pulse: 60 65 66 75  Temp: 97.6 F (36.4 C) 97.8 F (36.6 C) 97.8 F (36.6 C) 97.9 F (36.6 C)  TempSrc: Oral Oral Oral Oral  Resp: 17 15 19 14   Height:      Weight:      SpO2: 94% 94% 95%  97%   Eyes: PERRL,large ecchymotic area periorbital on the right Respiratory: clear to auscultation bilaterally, no wheezing, no crackles.  Cardiovascular: Regular rate and rhythm, no murmurs / rubs / gallops. No LE edema Abdomen: no tenderness. Bowel sounds positive.  Neurologic: Nonfocal Psychiatric: Alert and oriented to time and person, not to place.   Data Reviewed: I have personally reviewed following labs and imaging studies  CBC:  Recent Labs Lab 05/04/16 2025 05/05/16 0520 05/09/16 0543  WBC 14.1* 12.5* 10.9*  NEUTROABS 12.8*  --   --   HGB 12.9 11.9* 10.6*  HCT 37.6 35.7* 31.3*  MCV 92.2  90.2 88.7  PLT 254 238 [id number]   Basic Metabolic Panel:  Recent Labs Lab 05/08/16 0434 05/08/16 0748 05/08/16 1438 05/09/16 0543 05/09/16 1800 05/10/16 0500  NA 129* 126* 125* 126* 122* 123*  K 3.5  --  3.5 3.0* 3.7 3.9  CL 93*  --  90* 83* 83* 87*  CO2 24  --  23 32 29 29  GLUCOSE 143*  --  231* 134* 179* 127*  BUN 13  --  13 9 14 13   CREATININE 0.82  --  0.86 0.83 0.97 0.82  CALCIUM 8.3*  --  8.4* 8.4* 8.4* 8.5*  PHOS 2.0*  --   --  2.8  --  3.4   GFR: Estimated Creatinine Clearance: 51.6 mL/min (by C-G formula based on Cr of 0.82). Liver Function Tests:  Recent Labs Lab 05/08/16 0434 05/09/16 0543 05/10/16 0500  ALBUMIN 2.8* 3.0* 2.8*   No results for input(s): LIPASE, AMYLASE in the last 168 hours. No results for input(s): AMMONIA in the last 168 hours. Coagulation Profile: No results for input(s): INR, PROTIME in the last 168 hours. Cardiac Enzymes: No results for input(s): CKTOTAL, CKMB, CKMBINDEX, TROPONINI in the last 168 hours. BNP (last 3 results) No results for input(s): PROBNP in the last 8760 hours. HbA1C: No results for input(s): HGBA1C in the last 72 hours. CBG: No results for input(s): GLUCAP in the last 168 hours. Lipid Profile: No results for input(s): CHOL, HDL, LDLCALC, TRIG, CHOLHDL, LDLDIRECT in the last 72 hours. Thyroid Function Tests: No results for input(s): TSH, T4TOTAL, FREET4, T3FREE, THYROIDAB in the last 72 hours. Anemia Panel: No results for input(s): VITAMINB12, FOLATE, FERRITIN, TIBC, IRON, RETICCTPCT in the last 72 hours. Urine analysis:    Component Value Date/Time   COLORURINE YELLOW 05/04/2016 2250   APPEARANCEUR TURBID* 05/04/2016 2250   LABSPEC 1.016 05/04/2016 2250   PHURINE 7.5 05/04/2016 2250   GLUCOSEU 100* 05/04/2016 2250   HGBUR LARGE* 05/04/2016 2250   BILIRUBINUR NEGATIVE 05/04/2016 2250   KETONESUR NEGATIVE 05/04/2016 2250   PROTEINUR 30* 05/04/2016 2250   UROBILINOGEN 0.2 08/05/2013 1850   NITRITE NEGATIVE  05/04/2016 2250   LEUKOCYTESUR LARGE* 05/04/2016 2250   Sepsis Labs: Invalid input(s): PROCALCITONIN, LACTICIDVEN  Recent Results (from the past 240 hour(s))  Urine culture     Status: Abnormal   Collection Time: 05/04/16 10:50 PM  Result Value Ref Range Status   Specimen Description URINE, CLEAN CATCH  Final   Special Requests NONE  Final   Culture MULTIPLE SPECIES PRESENT, SUGGEST RECOLLECTION (A)  Final   Report Status 05/06/2016 FINAL  Final  Culture, Urine     Status: None   Collection Time: 05/06/16  4:06 PM  Result Value Ref Range Status   Specimen Description URINE, RANDOM  Final   Special Requests Normal  Final   Culture NO GROWTH  Final  Report Status 05/07/2016 FINAL  Final  MRSA PCR Screening     Status: None   Collection Time: 05/07/16  6:32 AM  Result Value Ref Range Status   MRSA by PCR NEGATIVE NEGATIVE Final    Comment:        The GeneXpert MRSA Assay (FDA approved for NASAL specimens only), is one component of a comprehensive MRSA colonization surveillance program. It is not intended to diagnose MRSA infection nor to guide or monitor treatment for MRSA infections.       Radiology Studies: No results found.   Scheduled Meds: . aspirin  81 mg Oral Daily  . calcium-vitamin D  1 tablet Oral Daily  . cefTRIAXone (ROCEPHIN)  IV  1 g Intravenous Q24H  . demeclocycline  150 mg Oral Q12H  . donepezil  10 mg Oral QHS  . enoxaparin (LOVENOX) injection  40 mg Subcutaneous Q24H  . furosemide  20 mg Oral Daily  . multivitamin with minerals  1 tablet Oral Daily  . omega-3 acid ethyl esters  1 g Oral Daily  . potassium chloride  40 mEq Oral BID  . sodium chloride  500 mL Intravenous Once  . sodium chloride flush  3 mL Intravenous Q12H   Continuous Infusions:     Hosie Poisson, MD,  [facility] Pager [phone]  If 7PM-7AM, please contact night-coverage [email or web address] Password [facility] 05/10/2016, 5:56 PM

## 2016-05-11 LAB — BASIC METABOLIC PANEL
Anion gap: 8 (ref 5–15)
BUN: 12 mg/dL (ref 6–20)
CALCIUM: 8.7 mg/dL — AB (ref 8.9–10.3)
CO2: 29 mmol/L (ref 22–32)
Chloride: 93 mmol/L — ABNORMAL LOW (ref 101–111)
Creatinine, Ser: 0.79 mg/dL (ref 0.44–1.00)
GFR calc Af Amer: >60 mL/min (ref >60)
GLUCOSE: 121 mg/dL — AB (ref 65–99)
POTASSIUM: 4.3 mmol/L (ref 3.5–5.1)
Sodium: 130 mmol/L — ABNORMAL LOW (ref 135–145)

## 2016-05-11 NOTE — Progress Notes (Signed)
Physical Therapy Treatment Patient Details Name: Carla Robles MRN: [id number] DOB: 19-Aug-1934 Today's Date: 05/11/2016    History of Present Illness Pt is a 82 y/o F who presented to the ED w/ c/o increased weakness and falls w/ a fall last night after running into the corner of the door when leaving the bathroom w/ the lights out.  She was found to be hyponatremic on admission.  Pt's PMH includes dementia, breast cancer and breast lumpectomy, NSTEMI, SIADH.    PT Comments    Carla Robles pleasant and agreeable to therapy today.  She requires min guard assist while ambulating and drifts Lt w/ head turns and demonstrates instability when stepping over objects.  Pt will benefit from continued skilled PT services to increase functional independence and safety.   Follow Up Recommendations  Home health PT;Supervision/Assistance - 24 hour     Equipment Recommendations  None recommended by PT    Recommendations for Other Services OT consult     Precautions / Restrictions Precautions Precautions: Fall Restrictions Weight Bearing Restrictions: No    Mobility  Bed Mobility Overal bed mobility: Modified Independent             General bed mobility comments: Increased time but no physical assist or cues needed  Transfers Overall transfer level: Needs assistance Equipment used: Rolling walker (2 wheeled) Transfers: Sit to/from Stand Sit to Stand: Min guard         General transfer comment: Min guard assist as pt tremulous w/ static stance.  Cues for hand placement using RW.   Ambulation/Gait Ambulation/Gait assistance: Min guard Ambulation Distance (Feet): 125 Feet Assistive device: Rolling walker (2 wheeled) Gait Pattern/deviations: Step-through pattern;Decreased stride length;Drifts right/left   Gait velocity interpretation: at or above normal speed for age/gender General Gait Details: Drifts Lt w/ head turns and instability when stepping over object but no physical assist  needed.  Close min guard as pt tremulous although this improves w/ ambualation.  Cues for proper management of RW as pt progressively pushing it too far ahead.   Stairs            Wheelchair Mobility    Modified Rankin (Stroke Patients Only)       Balance Overall balance assessment: Needs assistance;History of Falls Sitting-balance support: No upper extremity supported;Feet supported Sitting balance-Leahy Scale: Fair Sitting balance - Comments: Supervision for safety when sitting EOB   Standing balance support: Bilateral upper extremity supported;During functional activity Standing balance-Leahy Scale: Poor Standing balance comment: Relies on UE support.  Pt tremulous w/ static standing (Lt LE>Rt LE) Single Leg Stance - Right Leg: 30 (w/ 1 UE supported on counter) Single Leg Stance - Left Leg: 11 (w/ 1 UE supported on counter)     Rhomberg - Eyes Opened: 30 (w/ mod sway)          Cognition Arousal/Alertness: Awake/alert Behavior During Therapy: WFL for tasks assessed/performed Overall Cognitive Status: History of cognitive impairments - at baseline                      Exercises General Exercises - Lower Extremity Mini-Sqauts: Strengthening;5 reps;Other (comment) (w/ UEs supported on counter)    General Comments General comments (skin integrity, edema, etc.): HR up to 125 w/ SLS exercise.  All other VSS.      Pertinent Vitals/Pain Pain Assessment: Faces Faces Pain Scale: Hurts little more Pain Location: Rt flank Pain Descriptors / Indicators: Discomfort Pain Intervention(s): Limited activity within patient's tolerance;Monitored during session  Home Living                      Prior Function            PT Goals (current goals can now be found in the care plan section) Acute Rehab PT Goals Patient Stated Goal: agreeable w/ this PT about improving balance PT Goal Formulation: With patient/family Time For Goal Achievement:  05/24/16 Potential to Achieve Goals: Good Progress towards PT goals: Progressing toward goals    Frequency  Min 3X/week    PT Plan Current plan remains appropriate    Co-evaluation             End of Session Equipment Utilized During Treatment: Gait belt Activity Tolerance: Patient tolerated treatment well;Patient limited by fatigue Patient left: with call bell/phone within reach;in bed;with bed alarm set     Time: 1352-1414 PT Time Calculation (min) (ACUTE ONLY): 22 min  Charges:  $Gait Training: 8-22 mins                    G Codes:      Collie Siad PT, DPT  Pager: [phone] Phone: [phone] 05/11/2016, 2:27 PM

## 2016-05-11 NOTE — Progress Notes (Signed)
Pt unable to void since transfer at 1900 tonight. Pt was bladder scanned and it showed 814 mL. On call Donnal Debar, NP paged and notified. Awaiting further orders.

## 2016-05-11 NOTE — Progress Notes (Signed)
S: Sitting up eating breakfast.   O:BP 134/82 mmHg  Pulse 82  Temp(Src) 98 F (36.7 C) (Oral)  Resp 14  Ht 5' 3.5" (1.613 m)  Wt 66.497 kg (146 lb 9.6 oz)  BMI 25.56 kg/m2  SpO2 98%  Intake/Output Summary (Last 24 hours) at 05/11/16 0910 Last data filed at 05/10/16 1700  Gross per 24 hour  Intake    480 ml  Output    700 ml  Net   -220 ml   Weight change:  Gen: Awake and alert. Ecchymotic Rt eye/face CVS: RRR Resp: clear Abd: + BS NTND Ext: No edema NEURO: CNI Ox3  Lt IJ triple lumen   . aspirin  81 mg Oral Daily  . calcium-vitamin D  1 tablet Oral Daily  . demeclocycline  150 mg Oral Q12H  . donepezil  10 mg Oral QHS  . enoxaparin (LOVENOX) injection  40 mg Subcutaneous Q24H  . furosemide  20 mg Oral Daily  . multivitamin with minerals  1 tablet Oral Daily  . omega-3 acid ethyl esters  1 g Oral Daily  . potassium chloride  40 mEq Oral BID  . sodium chloride  500 mL Intravenous Once  . sodium chloride flush  3 mL Intravenous Q12H   No results found. BMET    Component Value Date/Time   NA 130* 05/11/2016 0545   K 4.3 05/11/2016 0545   CL 93* 05/11/2016 0545   CO2 29 05/11/2016 0545   GLUCOSE 121* 05/11/2016 0545   BUN 12 05/11/2016 0545   CREATININE 0.79 05/11/2016 0545   CALCIUM 8.7* 05/11/2016 0545   GFRNONAA >60 05/11/2016 0545   GFRAA >60 05/11/2016 0545   CBC    Component Value Date/Time   WBC 10.9* 05/09/2016 0543   RBC 3.53* 05/09/2016 0543   HGB 10.6* 05/09/2016 0543   HCT 31.3* 05/09/2016 0543   PLT 267 05/09/2016 0543   MCV 88.7 05/09/2016 0543   MCH 30.0 05/09/2016 0543   MCHC 33.9 05/09/2016 0543   RDW 13.6 05/09/2016 0543   LYMPHSABS 0.6* 05/04/2016 2025   MONOABS 0.7 05/04/2016 2025   EOSABS 0.0 05/04/2016 2025   BASOSABS 0.0 05/04/2016 2025     Assessment: 1. Hyponatremia, SNa improved.  UO incomplete  2. HypoPO4, improved 3. Hypokalemia, improved  Plan: 1. Cont current therapy.  Can DC q 12hr BMET 2. Check SNA in AM 3.   Needs to work with PT Swannie Milius T

## 2016-05-11 NOTE — Progress Notes (Signed)
PROGRESS NOTE  Carla Robles [id number] DOB: 27-Jan-1934 DOA: 05/04/2016 PCP: Rachell Cipro, MD   LOS: 7 days   Brief Narrative: 82 y.o. female with a remote hx of Breast Cancer, SIADH, CAD, HTN, and Hyperlipidemia who presented to the [facility] ED with complaints of increased weakness and falls, with a fall last night in which she hit her head on the corner of a table. She was found to be hyponatremic with sodium of 120 on admission. Patient's initial CXR showed vascular congestion and received Lasix with excellent UOP however sodium decreased further, was placed on fluid restriction with ongoing worsening. Nephrology consulted and patient transferred to SDU on 8/4 evening for hypertonic saline. With improvement her 3% Na discontinued 6/5 pm. Demeclocycline started on 6/8. Sodium improved to 130 today.   Assessment & Plan: Principal Problem:   Hyponatremia Active Problems:   Essential hypertension   Mild cognitive impairment   Urinary tract infection, site not specified   Pulmonary vascular congestion   SIADH (syndrome of inappropriate ADH production) (HCC)   Hypercholesteremia   Hyponatremia due to SIADH - for the first time this admission, her sodium improved to 130 after starting demeclocycline. Appreciate nephrology assistance.  - Na improving, discontinued  hypertonic saline   Mild pulmonary edema on CXR due to acute on chronic diastolic CHF - 2D echo with normal EF, grade 2 DD - on room air - euvolemic currently   UTI  - cultures pending, reviewed today with "multiple species, suggesting recollection". Favor treatment for 5 days. -completed the treatment   Hypotension: Discontinued the labetalol and  Lisnopril. bp parameters improved.   Cognitive impairment  - Aricept   Hypokalemia  replete as needed and repeat in am.   DVT prophylaxis: Lovenox Code Status: Full Family Communication: discussed with husband on the phone.  Disposition Plan: possibly home in a day  or two.   Consultants:   Nephrology  Procedures:   None   Antimicrobials:  Ceftriaxone 6/3 >>   Subjective: Int he bed, alert and oriented and worked with physical therapy.   Objective: Filed Vitals:   05/11/16 0016 05/11/16 0400 05/11/16 0724 05/11/16 1200  BP: 118/107 158/74 134/82 113/50  Pulse: 70 77 82 83  Temp: 98.2 F (36.8 C) 97.9 F (36.6 C) 98 F (36.7 C) 98.1 F (36.7 C)  TempSrc: Oral Oral Oral Oral  Resp: 16 13 14 15   Height:      Weight:      SpO2: 96% 95% 98% 97%    Intake/Output Summary (Last 24 hours) at 05/11/16 1600 Last data filed at 05/11/16 0945  Gross per 24 hour  Intake    480 ml  Output    300 ml  Net    180 ml   Filed Weights   05/04/16 2047 05/05/16 0204  Weight: 63.504 kg (140 lb) 66.497 kg (146 lb 9.6 oz)    Examination: Constitutional: NAD Filed Vitals:   05/11/16 0016 05/11/16 0400 05/11/16 0724 05/11/16 1200  BP: 118/107 158/74 134/82 113/50  Pulse: 70 77 82 83  Temp: 98.2 F (36.8 C) 97.9 F (36.6 C) 98 F (36.7 C) 98.1 F (36.7 C)  TempSrc: Oral Oral Oral Oral  Resp: 16 13 14 15   Height:      Weight:      SpO2: 96% 95% 98% 97%   Eyes: PERRL,large ecchymotic area periorbital on the right Respiratory: clear to auscultation bilaterally, no wheezing, no crackles.  Cardiovascular: Regular rate and rhythm, no murmurs / rubs /  gallops. No LE edema Abdomen: no tenderness. Bowel sounds positive.  Neurologic: Nonfocal Psychiatric: Alert and oriented to time and person, not to place.   Data Reviewed: I have personally reviewed following labs and imaging studies  CBC:  Recent Labs Lab 05/04/16 2025 05/05/16 0520 05/09/16 0543  WBC 14.1* 12.5* 10.9*  NEUTROABS 12.8*  --   --   HGB 12.9 11.9* 10.6*  HCT 37.6 35.7* 31.3*  MCV 92.2 90.2 88.7  PLT 254 238 99991111   Basic Metabolic Panel:  Recent Labs Lab 05/08/16 0434  05/09/16 0543 05/09/16 1800 05/10/16 0500 05/10/16 2129 05/11/16 0545  NA 129*  < > 126*  122* 123* 128* 130*  K 3.5  < > 3.0* 3.7 3.9 4.0 4.3  CL 93*  < > 83* 83* 87* 89* 93*  CO2 24  < > 32 29 29 29 29   GLUCOSE 143*  < > 134* 179* 127* 141* 121*  BUN 13  < > 9 14 13 12 12   CREATININE 0.82  < > 0.83 0.97 0.82 0.95 0.79  CALCIUM 8.3*  < > 8.4* 8.4* 8.5* 9.0 8.7*  PHOS 2.0*  --  2.8  --  3.4  --   --   < > = values in this interval not displayed. GFR: Estimated Creatinine Clearance: 52.9 mL/min (by C-G formula based on Cr of 0.79). Liver Function Tests:  Recent Labs Lab 05/08/16 0434 05/09/16 0543 05/10/16 0500  ALBUMIN 2.8* 3.0* 2.8*   No results for input(s): LIPASE, AMYLASE in the last 168 hours. No results for input(s): AMMONIA in the last 168 hours. Coagulation Profile: No results for input(s): INR, PROTIME in the last 168 hours. Cardiac Enzymes: No results for input(s): CKTOTAL, CKMB, CKMBINDEX, TROPONINI in the last 168 hours. BNP (last 3 results) No results for input(s): PROBNP in the last 8760 hours. HbA1C: No results for input(s): HGBA1C in the last 72 hours. CBG: No results for input(s): GLUCAP in the last 168 hours. Lipid Profile: No results for input(s): CHOL, HDL, LDLCALC, TRIG, CHOLHDL, LDLDIRECT in the last 72 hours. Thyroid Function Tests: No results for input(s): TSH, T4TOTAL, FREET4, T3FREE, THYROIDAB in the last 72 hours. Anemia Panel: No results for input(s): VITAMINB12, FOLATE, FERRITIN, TIBC, IRON, RETICCTPCT in the last 72 hours. Urine analysis:    Component Value Date/Time   COLORURINE YELLOW 05/04/2016 2250   APPEARANCEUR TURBID* 05/04/2016 2250   LABSPEC 1.016 05/04/2016 2250   PHURINE 7.5 05/04/2016 2250   GLUCOSEU 100* 05/04/2016 2250   HGBUR LARGE* 05/04/2016 2250   BILIRUBINUR NEGATIVE 05/04/2016 2250   KETONESUR NEGATIVE 05/04/2016 2250   PROTEINUR 30* 05/04/2016 2250   UROBILINOGEN 0.2 08/05/2013 1850   NITRITE NEGATIVE 05/04/2016 2250   LEUKOCYTESUR LARGE* 05/04/2016 2250   Sepsis Labs: Invalid input(s):  PROCALCITONIN, LACTICIDVEN  Recent Results (from the past 240 hour(s))  Urine culture     Status: Abnormal   Collection Time: 05/04/16 10:50 PM  Result Value Ref Range Status   Specimen Description URINE, CLEAN CATCH  Final   Special Requests NONE  Final   Culture MULTIPLE SPECIES PRESENT, SUGGEST RECOLLECTION (A)  Final   Report Status 05/06/2016 FINAL  Final  Culture, Urine     Status: None   Collection Time: 05/06/16  4:06 PM  Result Value Ref Range Status   Specimen Description URINE, RANDOM  Final   Special Requests Normal  Final   Culture NO GROWTH  Final   Report Status 05/07/2016 FINAL  Final  MRSA  PCR Screening     Status: None   Collection Time: 05/07/16  6:32 AM  Result Value Ref Range Status   MRSA by PCR NEGATIVE NEGATIVE Final    Comment:        The GeneXpert MRSA Assay (FDA approved for NASAL specimens only), is one component of a comprehensive MRSA colonization surveillance program. It is not intended to diagnose MRSA infection nor to guide or monitor treatment for MRSA infections.       Radiology Studies: No results found.   Scheduled Meds: . aspirin  81 mg Oral Daily  . calcium-vitamin D  1 tablet Oral Daily  . demeclocycline  150 mg Oral Q12H  . donepezil  10 mg Oral QHS  . enoxaparin (LOVENOX) injection  40 mg Subcutaneous Q24H  . furosemide  20 mg Oral Daily  . multivitamin with minerals  1 tablet Oral Daily  . omega-3 acid ethyl esters  1 g Oral Daily  . potassium chloride  40 mEq Oral BID  . sodium chloride  500 mL Intravenous Once  . sodium chloride flush  3 mL Intravenous Q12H   Continuous Infusions:     Hosie Poisson, MD,  [facility] Pager [phone]  If 7PM-7AM, please contact night-coverage [email or web address] Password [email or web address] 05/11/2016, 4:00 PM

## 2016-05-12 LAB — BASIC METABOLIC PANEL
ANION GAP: 9 (ref 5–15)
BUN: 14 mg/dL (ref 6–20)
CALCIUM: 9.1 mg/dL (ref 8.9–10.3)
CO2: 29 mmol/L (ref 22–32)
Chloride: 94 mmol/L — ABNORMAL LOW (ref 101–111)
Creatinine, Ser: 0.83 mg/dL (ref 0.44–1.00)
GFR calc Af Amer: >60 mL/min (ref >60)
Glucose, Bld: 103 mg/dL — ABNORMAL HIGH (ref 65–99)
POTASSIUM: 4.4 mmol/L (ref 3.5–5.1)
SODIUM: 132 mmol/L — AB (ref 135–145)

## 2016-05-12 MED ORDER — SODIUM CHLORIDE 0.9% FLUSH: 10.0000 mL | Freq: Two times a day (BID) | INTRAVENOUS | Status: DC

## 2016-05-12 MED ORDER — SODIUM CHLORIDE 0.9% FLUSH: 10.0000 mL | INTRAVENOUS | Status: DC | PRN

## 2016-05-12 NOTE — Progress Notes (Signed)
PROGRESS NOTE  CLAUDELL LINQUIST [id number] DOB: 24-Feb-1934 DOA: 05/04/2016 PCP: Rachell Cipro, MD   LOS: 8 days   Brief Narrative: 82 y.o. female with a remote hx of Breast Cancer, SIADH, CAD, HTN, and Hyperlipidemia who presented to the [facility] ED with complaints of increased weakness and falls, with a fall last night in which she hit her head on the corner of a table. She was found to be hyponatremic with sodium of 120 on admission. Patient's initial CXR showed vascular congestion and received Lasix with excellent UOP however sodium decreased further, was placed on fluid restriction with ongoing worsening. Nephrology consulted and patient transferred to SDU on 8/4 evening for hypertonic saline. With improvement her 3% Na discontinued 6/5 pm. Demeclocycline started on 6/8. Sodium improved to 132 today.   Assessment & Plan: Principal Problem:   Hyponatremia Active Problems:   Essential hypertension   Mild cognitive impairment   Urinary tract infection, site not specified   Pulmonary vascular congestion   SIADH (syndrome of inappropriate ADH production) (HCC)   Hypercholesteremia   Hyponatremia due to SIADH - for the first time this admission, her sodium improved to 130- 132 after starting demeclocycline. Appreciate nephrology assistance.  - Na improving, discontinued  hypertonic saline - plan to d/c the trpiple lumen after we get a peripheral line.    Mild pulmonary edema on CXR due to acute on chronic diastolic CHF - 2D echo with normal EF, grade 2 DD - on room air - euvolemic currently   UTI  - cultures pending, reviewed today with "multiple species, suggesting recollection". Favor treatment for 5 days. -completed the treatment   Hypotension: Discontinued the labetalol and  Lisnopril. bp parameters improved.   Cognitive impairment  - Aricept   Hypokalemia  replete as needed and repeat in am.  Urinary retention: 3 times in and out cath has been done and plan to do a  foley catheter placement today.    DVT prophylaxis: Lovenox Code Status: Full Family Communication: discussed with husband on the phone.  Disposition Plan: possibly home in a day or two.   Consultants:   Nephrology  Procedures:   None   Antimicrobials:  Ceftriaxone 6/3 >>   Subjective: In chair , wants to go back to bed.   Objective: Filed Vitals:   05/11/16 2159 05/12/16 0610 05/12/16 1100 05/12/16 1349  BP: 175/71 161/71 148/62 139/81  Pulse: 84 75 82 79  Temp: 97.8 F (36.6 C) 98.1 F (36.7 C) 97.4 F (36.3 C) 98 F (36.7 C)  TempSrc: Oral Oral Oral Oral  Resp: 16 16 18 18   Height:      Weight:      SpO2: 97% 97% 96% 97%    Intake/Output Summary (Last 24 hours) at 05/12/16 1722 Last data filed at 05/12/16 1556  Gross per 24 hour  Intake    880 ml  Output   3200 ml  Net  -2320 ml   Filed Weights   05/04/16 2047 05/05/16 0204  Weight: 63.504 kg (140 lb) 66.497 kg (146 lb 9.6 oz)    Examination: Constitutional: NAD Filed Vitals:   05/11/16 2159 05/12/16 0610 05/12/16 1100 05/12/16 1349  BP: 175/71 161/71 148/62 139/81  Pulse: 84 75 82 79  Temp: 97.8 F (36.6 C) 98.1 F (36.7 C) 97.4 F (36.3 C) 98 F (36.7 C)  TempSrc: Oral Oral Oral Oral  Resp: 16 16 18 18   Height:      Weight:      SpO2: 97%  97% 96% 97%   Eyes: PERRL,large ecchymotic area periorbital on the right Respiratory: clear to auscultation bilaterally, no wheezing, no crackles.  Cardiovascular: Regular rate and rhythm, no murmurs / rubs / gallops. No LE edema Abdomen: no tenderness. Bowel sounds positive.  Neurologic: Nonfocal Psychiatric: Alert and oriented to time and person, not to place.   Data Reviewed: I have personally reviewed following labs and imaging studies  CBC:  Recent Labs Lab 05/09/16 0543  WBC 10.9*  HGB 10.6*  HCT 31.3*  MCV 88.7  PLT 99991111   Basic Metabolic Panel:  Recent Labs Lab 05/08/16 0434  05/09/16 0543 05/09/16 1800 05/10/16 0500  05/10/16 2129 05/11/16 0545 05/12/16 0600  NA 129*  < > 126* 122* 123* 128* 130* 132*  K 3.5  < > 3.0* 3.7 3.9 4.0 4.3 4.4  CL 93*  < > 83* 83* 87* 89* 93* 94*  CO2 24  < > 32 29 29 29 29 29   GLUCOSE 143*  < > 134* 179* 127* 141* 121* 103*  BUN 13  < > 9 14 13 12 12 14   CREATININE 0.82  < > 0.83 0.97 0.82 0.95 0.79 0.83  CALCIUM 8.3*  < > 8.4* 8.4* 8.5* 9.0 8.7* 9.1  PHOS 2.0*  --  2.8  --  3.4  --   --   --   < > = values in this interval not displayed. GFR: Estimated Creatinine Clearance: 51 mL/min (by C-G formula based on Cr of 0.83). Liver Function Tests:  Recent Labs Lab 05/08/16 0434 05/09/16 0543 05/10/16 0500  ALBUMIN 2.8* 3.0* 2.8*   No results for input(s): LIPASE, AMYLASE in the last 168 hours. No results for input(s): AMMONIA in the last 168 hours. Coagulation Profile: No results for input(s): INR, PROTIME in the last 168 hours. Cardiac Enzymes: No results for input(s): CKTOTAL, CKMB, CKMBINDEX, TROPONINI in the last 168 hours. BNP (last 3 results) No results for input(s): PROBNP in the last 8760 hours. HbA1C: No results for input(s): HGBA1C in the last 72 hours. CBG: No results for input(s): GLUCAP in the last 168 hours. Lipid Profile: No results for input(s): CHOL, HDL, LDLCALC, TRIG, CHOLHDL, LDLDIRECT in the last 72 hours. Thyroid Function Tests: No results for input(s): TSH, T4TOTAL, FREET4, T3FREE, THYROIDAB in the last 72 hours. Anemia Panel: No results for input(s): VITAMINB12, FOLATE, FERRITIN, TIBC, IRON, RETICCTPCT in the last 72 hours. Urine analysis:    Component Value Date/Time   COLORURINE YELLOW 05/04/2016 2250   APPEARANCEUR TURBID* 05/04/2016 2250   LABSPEC 1.016 05/04/2016 2250   PHURINE 7.5 05/04/2016 2250   GLUCOSEU 100* 05/04/2016 2250   HGBUR LARGE* 05/04/2016 2250   BILIRUBINUR NEGATIVE 05/04/2016 2250   KETONESUR NEGATIVE 05/04/2016 2250   PROTEINUR 30* 05/04/2016 2250   UROBILINOGEN 0.2 08/05/2013 1850   NITRITE NEGATIVE  05/04/2016 2250   LEUKOCYTESUR LARGE* 05/04/2016 2250   Sepsis Labs: Invalid input(s): PROCALCITONIN, LACTICIDVEN  Recent Results (from the past 240 hour(s))  Urine culture     Status: Abnormal   Collection Time: 05/04/16 10:50 PM  Result Value Ref Range Status   Specimen Description URINE, CLEAN CATCH  Final   Special Requests NONE  Final   Culture MULTIPLE SPECIES PRESENT, SUGGEST RECOLLECTION (A)  Final   Report Status 05/06/2016 FINAL  Final  Culture, Urine     Status: None   Collection Time: 05/06/16  4:06 PM  Result Value Ref Range Status   Specimen Description URINE, RANDOM  Final  Special Requests Normal  Final   Culture NO GROWTH  Final   Report Status 05/07/2016 FINAL  Final  MRSA PCR Screening     Status: None   Collection Time: 05/07/16  6:32 AM  Result Value Ref Range Status   MRSA by PCR NEGATIVE NEGATIVE Final    Comment:        The GeneXpert MRSA Assay (FDA approved for NASAL specimens only), is one component of a comprehensive MRSA colonization surveillance program. It is not intended to diagnose MRSA infection nor to guide or monitor treatment for MRSA infections.       Radiology Studies: No results found.   Scheduled Meds: . aspirin  81 mg Oral Daily  . calcium-vitamin D  1 tablet Oral Daily  . demeclocycline  150 mg Oral Q12H  . donepezil  10 mg Oral QHS  . enoxaparin (LOVENOX) injection  40 mg Subcutaneous Q24H  . furosemide  20 mg Oral Daily  . multivitamin with minerals  1 tablet Oral Daily  . omega-3 acid ethyl esters  1 g Oral Daily  . potassium chloride  40 mEq Oral BID  . sodium chloride  500 mL Intravenous Once  . sodium chloride flush  10-40 mL Intracatheter Q12H  . sodium chloride flush  3 mL Intravenous Q12H   Continuous Infusions:     Hosie Poisson, MD,  [facility] Pager [phone]  If 7PM-7AM, please contact night-coverage [email or web address] Password [email or web address] 05/12/2016, 5:22 PM

## 2016-05-12 NOTE — Progress Notes (Signed)
S: No new CO   O:BP 161/71 mmHg  Pulse 75  Temp(Src) 98.1 F (36.7 C) (Oral)  Resp 16  Ht 5' 3.5" (1.613 m)  Wt 66.497 kg (146 lb 9.6 oz)  BMI 25.56 kg/m2  SpO2 97%  Intake/Output Summary (Last 24 hours) at 05/12/16 1029 Last data filed at 05/12/16 0900  Gross per 24 hour  Intake    300 ml  Output   1200 ml  Net   -900 ml   Weight change:  Gen: Awake and alert. Ecchymotic Rt eye/face CVS: RRR Resp: clear Abd: + BS NTND Ext: No edema NEURO: CNI Ox3  Lt IJ triple lumen   . aspirin  81 mg Oral Daily  . calcium-vitamin D  1 tablet Oral Daily  . demeclocycline  150 mg Oral Q12H  . donepezil  10 mg Oral QHS  . enoxaparin (LOVENOX) injection  40 mg Subcutaneous Q24H  . furosemide  20 mg Oral Daily  . multivitamin with minerals  1 tablet Oral Daily  . omega-3 acid ethyl esters  1 g Oral Daily  . potassium chloride  40 mEq Oral BID  . sodium chloride  500 mL Intravenous Once  . sodium chloride flush  10-40 mL Intracatheter Q12H  . sodium chloride flush  3 mL Intravenous Q12H   No results found. BMET    Component Value Date/Time   NA 132* 05/12/2016 0600   K 4.4 05/12/2016 0600   CL 94* 05/12/2016 0600   CO2 29 05/12/2016 0600   GLUCOSE 103* 05/12/2016 0600   BUN 14 05/12/2016 0600   CREATININE 0.83 05/12/2016 0600   CALCIUM 9.1 05/12/2016 0600   GFRNONAA >60 05/12/2016 0600   GFRAA >60 05/12/2016 0600   CBC    Component Value Date/Time   WBC 10.9* 05/09/2016 0543   RBC 3.53* 05/09/2016 0543   HGB 10.6* 05/09/2016 0543   HCT 31.3* 05/09/2016 0543   PLT 267 05/09/2016 0543   MCV 88.7 05/09/2016 0543   MCH 30.0 05/09/2016 0543   MCHC 33.9 05/09/2016 0543   RDW 13.6 05/09/2016 0543   LYMPHSABS 0.6* 05/04/2016 2025   MONOABS 0.7 05/04/2016 2025   EOSABS 0.0 05/04/2016 2025   BASOSABS 0.0 05/04/2016 2025     Assessment: 1. Hyponatremia, SNa improved.   2. HypoPO4, improved 3. Hypokalemia, improved  Plan: 1. Cont current therapy for hyponatremia  2.   I would DC triple lumen to reduce risk of infection 3.  Daily SNa Bettina Warn T

## 2016-05-13 LAB — BASIC METABOLIC PANEL
Anion gap: 8 (ref 5–15)
BUN: 15 mg/dL (ref 6–20)
CO2: 27 mmol/L (ref 22–32)
CREATININE: 0.83 mg/dL (ref 0.44–1.00)
Calcium: 9.3 mg/dL (ref 8.9–10.3)
Chloride: 95 mmol/L — ABNORMAL LOW (ref 101–111)
Glucose, Bld: 108 mg/dL — ABNORMAL HIGH (ref 65–99)
Potassium: 4.5 mmol/L (ref 3.5–5.1)
SODIUM: 130 mmol/L — AB (ref 135–145)

## 2016-05-13 MED ORDER — FUROSEMIDE 20 MG PO TABS
10.0000 mg | ORAL_TABLET | Freq: Every day | ORAL | Status: DC
  Administered 2016-05-14: 10 mg via ORAL
  Filled 2016-05-13: qty 1

## 2016-05-13 NOTE — Progress Notes (Signed)
PROGRESS NOTE  Carla Robles [id number] DOB: October 24, 1934 DOA: 05/04/2016 PCP: Rachell Cipro, MD   LOS: 9 days   Brief Narrative: 82 y.o. female with a remote hx of Breast Cancer, SIADH, CAD, HTN, and Hyperlipidemia who presented to the [facility] ED with complaints of increased weakness and falls, with a fall last night in which she hit her head on the corner of a table. She was found to be hyponatremic with sodium of 120 on admission. Patient's initial CXR showed vascular congestion and received Lasix with excellent UOP however sodium decreased further, was placed on fluid restriction with ongoing worsening. Nephrology consulted and patient transferred to SDU on 8/4 evening for hypertonic saline. With improvement her 3% Na discontinued 6/5 pm. Demeclocycline started on 6/8. Sodium improved to 130 today.   Assessment & Plan: Principal Problem:   Hyponatremia Active Problems:   Essential hypertension   Mild cognitive impairment   Urinary tract infection, site not specified   Pulmonary vascular congestion   SIADH (syndrome of inappropriate ADH production) (HCC)   Hypercholesteremia   Hyponatremia due to SIADH - for the first time this admission, her sodium improved to 130- 132 after starting demeclocycline. Appreciate nephrology assistance.  - Na improving, discontinued  hypertonic saline - plan to d/c the trpiple lumen after we get a peripheral line.  - LASIX decreased to 10 mg daily, diuresed about 2 lit /24 hours. .  - pt requesting to drink water,  Told her fluid restriction to 1200 ml per day.    Mild pulmonary edema on CXR due to acute on chronic diastolic CHF - 2D echo with normal EF, grade 2 DD - on room air - euvolemic currently   UTI  - cultures pending, reviewed today with "multiple species, suggesting recollection". Favor treatment for 5 days. -completed the treatment   Hypotension: Discontinued the labetalol and  Lisnopril. bp parameters improved.   Cognitive  impairment  - Aricept   Hypokalemia  replete as needed and repeat in am.  Urinary retention: 3 times in and out cath has been done and plan to do a foley catheter placement today.  Repeat UA will be sent.   DVT prophylaxis: Lovenox Code Status: Full Family Communication: discussed with husband on the phone.  Disposition Plan: possibly home in a day or two.   Consultants:   Nephrology  Procedures:   None   Antimicrobials:  Ceftriaxone 6/3 >>   Subjective: Wants water, she reports she feels dry.   Objective: Filed Vitals:   05/12/16 2017 05/12/16 2355 05/13/16 0433 05/13/16 1328  BP: 159/76 145/70 98/86 135/63  Pulse: 85 82 76 79  Temp: 97.9 F (36.6 C) 98 F (36.7 C) 98 F (36.7 C) 97.5 F (36.4 C)  TempSrc: Oral Oral Oral Oral  Resp: 18 16 16 18   Height:      Weight:      SpO2: 97% 98% 96% 94%    Intake/Output Summary (Last 24 hours) at 05/13/16 1503 Last data filed at 05/13/16 1322  Gross per 24 hour  Intake    603 ml  Output   3525 ml  Net  -2922 ml   Filed Weights   05/04/16 2047 05/05/16 0204  Weight: 63.504 kg (140 lb) 66.497 kg (146 lb 9.6 oz)    Examination: Constitutional: NAD Filed Vitals:   05/12/16 2017 05/12/16 2355 05/13/16 0433 05/13/16 1328  BP: 159/76 145/70 98/86 135/63  Pulse: 85 82 76 79  Temp: 97.9 F (36.6 C) 98 F (36.7 C)  98 F (36.7 C) 97.5 F (36.4 C)  TempSrc: Oral Oral Oral Oral  Resp: 18 16 16 18   Height:      Weight:      SpO2: 97% 98% 96% 94%   Eyes: PERRL,large ecchymotic area periorbital on the right Respiratory: clear to auscultation bilaterally, no wheezing, no crackles.  Cardiovascular: Regular rate and rhythm, no murmurs / rubs / gallops. No LE edema Abdomen: no tenderness. Bowel sounds positive.  Neurologic: Nonfocal Psychiatric: Alert and oriented to time and person, not to place.   Data Reviewed: I have personally reviewed following labs and imaging studies  CBC:  Recent Labs Lab  05/09/16 0543  WBC 10.9*  HGB 10.6*  HCT 31.3*  MCV 88.7  PLT 99991111   Basic Metabolic Panel:  Recent Labs Lab 05/08/16 0434  05/09/16 0543  05/10/16 0500 05/10/16 2129 05/11/16 0545 05/12/16 0600 05/13/16 0514  NA 129*  < > 126*  < > 123* 128* 130* 132* 130*  K 3.5  < > 3.0*  < > 3.9 4.0 4.3 4.4 4.5  CL 93*  < > 83*  < > 87* 89* 93* 94* 95*  CO2 24  < > 32  < > 29 29 29 29 27   GLUCOSE 143*  < > 134*  < > 127* 141* 121* 103* 108*  BUN 13  < > 9  < > 13 12 12 14 15   CREATININE 0.82  < > 0.83  < > 0.82 0.95 0.79 0.83 0.83  CALCIUM 8.3*  < > 8.4*  < > 8.5* 9.0 8.7* 9.1 9.3  PHOS 2.0*  --  2.8  --  3.4  --   --   --   --   < > = values in this interval not displayed. GFR: Estimated Creatinine Clearance: 51 mL/min (by C-G formula based on Cr of 0.83). Liver Function Tests:  Recent Labs Lab 05/08/16 0434 05/09/16 0543 05/10/16 0500  ALBUMIN 2.8* 3.0* 2.8*   No results for input(s): LIPASE, AMYLASE in the last 168 hours. No results for input(s): AMMONIA in the last 168 hours. Coagulation Profile: No results for input(s): INR, PROTIME in the last 168 hours. Cardiac Enzymes: No results for input(s): CKTOTAL, CKMB, CKMBINDEX, TROPONINI in the last 168 hours. BNP (last 3 results) No results for input(s): PROBNP in the last 8760 hours. HbA1C: No results for input(s): HGBA1C in the last 72 hours. CBG: No results for input(s): GLUCAP in the last 168 hours. Lipid Profile: No results for input(s): CHOL, HDL, LDLCALC, TRIG, CHOLHDL, LDLDIRECT in the last 72 hours. Thyroid Function Tests: No results for input(s): TSH, T4TOTAL, FREET4, T3FREE, THYROIDAB in the last 72 hours. Anemia Panel: No results for input(s): VITAMINB12, FOLATE, FERRITIN, TIBC, IRON, RETICCTPCT in the last 72 hours. Urine analysis:    Component Value Date/Time   COLORURINE YELLOW 05/04/2016 2250   APPEARANCEUR TURBID* 05/04/2016 2250   LABSPEC 1.016 05/04/2016 2250   PHURINE 7.5 05/04/2016 2250    GLUCOSEU 100* 05/04/2016 2250   HGBUR LARGE* 05/04/2016 2250   BILIRUBINUR NEGATIVE 05/04/2016 2250   KETONESUR NEGATIVE 05/04/2016 2250   PROTEINUR 30* 05/04/2016 2250   UROBILINOGEN 0.2 08/05/2013 1850   NITRITE NEGATIVE 05/04/2016 2250   LEUKOCYTESUR LARGE* 05/04/2016 2250   Sepsis Labs: Invalid input(s): PROCALCITONIN, LACTICIDVEN  Recent Results (from the past 240 hour(s))  Urine culture     Status: Abnormal   Collection Time: 05/04/16 10:50 PM  Result Value Ref Range Status   Specimen Description  URINE, CLEAN CATCH  Final   Special Requests NONE  Final   Culture MULTIPLE SPECIES PRESENT, SUGGEST RECOLLECTION (A)  Final   Report Status 05/06/2016 FINAL  Final  Culture, Urine     Status: None   Collection Time: 05/06/16  4:06 PM  Result Value Ref Range Status   Specimen Description URINE, RANDOM  Final   Special Requests Normal  Final   Culture NO GROWTH  Final   Report Status 05/07/2016 FINAL  Final  MRSA PCR Screening     Status: None   Collection Time: 05/07/16  6:32 AM  Result Value Ref Range Status   MRSA by PCR NEGATIVE NEGATIVE Final    Comment:        The GeneXpert MRSA Assay (FDA approved for NASAL specimens only), is one component of a comprehensive MRSA colonization surveillance program. It is not intended to diagnose MRSA infection nor to guide or monitor treatment for MRSA infections.       Radiology Studies: No results found.   Scheduled Meds: . aspirin  81 mg Oral Daily  . calcium-vitamin D  1 tablet Oral Daily  . demeclocycline  150 mg Oral Q12H  . donepezil  10 mg Oral QHS  . enoxaparin (LOVENOX) injection  40 mg Subcutaneous Q24H  . [START ON 05/14/2016] furosemide  10 mg Oral Daily  . multivitamin with minerals  1 tablet Oral Daily  . omega-3 acid ethyl esters  1 g Oral Daily  . potassium chloride  40 mEq Oral BID  . sodium chloride  500 mL Intravenous Once  . sodium chloride flush  10-40 mL Intracatheter Q12H  . sodium chloride flush   3 mL Intravenous Q12H   Continuous Infusions:     Hosie Poisson, MD,  [facility] Pager [phone]  If 7PM-7AM, please contact night-coverage [email or web address] Password [facility] 05/13/2016, 3:03 PM

## 2016-05-13 NOTE — Progress Notes (Signed)
   05/13/16 1708  Orthostatic Lying   BP- Lying 136/63 mmHg  Pulse- Lying 80  Orthostatic Sitting  BP- Sitting 130/60 mmHg  Pulse- Sitting 88  Orthostatic Standing at 0 minutes  BP- Standing at 0 minutes (!) 50/30 mmHg  Pulse- Standing at 0 minutes 179  Orthostatic Standing at 3 minutes  BP- Standing at 3 minutes (!) 81/65 mmHg  Pulse- Standing at 3 minutes 179  Orthostatics completed per MD order. Pt reported feeling wobbly with standing. 1+ moderate assist. R arm bent during first standing bp. Heart rate on monitor registered 129, not 179 as indicated by dinamap. Will continue to monitor. Bartholomew Crews, RN

## 2016-05-13 NOTE — Progress Notes (Addendum)
S: No new CO.  Eating well   Triple lumen out O:BP 98/86 mmHg  Pulse 76  Temp(Src) 98 F (36.7 C) (Oral)  Resp 16  Ht 5' 3.5" (1.613 m)  Wt 66.497 kg (146 lb 9.6 oz)  BMI 25.56 kg/m2  SpO2 96%  Intake/Output Summary (Last 24 hours) at 05/13/16 0955 Last data filed at 05/13/16 0915  Gross per 24 hour  Intake    460 ml  Output   4225 ml  Net  -3765 ml   Weight change:  Gen: Awake and alert. Ecchymotic Rt eye/face, improving CVS: RRR Resp: clear Abd: + BS NTND Ext: No edema NEURO: CNI Ox3     . aspirin  81 mg Oral Daily  . calcium-vitamin D  1 tablet Oral Daily  . demeclocycline  150 mg Oral Q12H  . donepezil  10 mg Oral QHS  . enoxaparin (LOVENOX) injection  40 mg Subcutaneous Q24H  . furosemide  20 mg Oral Daily  . multivitamin with minerals  1 tablet Oral Daily  . omega-3 acid ethyl esters  1 g Oral Daily  . potassium chloride  40 mEq Oral BID  . sodium chloride  500 mL Intravenous Once  . sodium chloride flush  10-40 mL Intracatheter Q12H  . sodium chloride flush  3 mL Intravenous Q12H   No results found. BMET    Component Value Date/Time   NA 130* 05/13/2016 0514   K 4.5 05/13/2016 0514   CL 95* 05/13/2016 0514   CO2 27 05/13/2016 0514   GLUCOSE 108* 05/13/2016 0514   BUN 15 05/13/2016 0514   CREATININE 0.83 05/13/2016 0514   CALCIUM 9.3 05/13/2016 0514   GFRNONAA >60 05/13/2016 0514   GFRAA >60 05/13/2016 0514   CBC    Component Value Date/Time   WBC 10.9* 05/09/2016 0543   RBC 3.53* 05/09/2016 0543   HGB 10.6* 05/09/2016 0543   HCT 31.3* 05/09/2016 0543   PLT 267 05/09/2016 0543   MCV 88.7 05/09/2016 0543   MCH 30.0 05/09/2016 0543   MCHC 33.9 05/09/2016 0543   RDW 13.6 05/09/2016 0543   LYMPHSABS 0.6* 05/04/2016 2025   MONOABS 0.7 05/04/2016 2025   EOSABS 0.0 05/04/2016 2025   BASOSABS 0.0 05/04/2016 2025     Assessment: 1. Hyponatremia, SNa sl low and stable.  UO excellent   2. HypoPO4, improved 3. Hypokalemia, improved  Plan: 1.  Decrease lasix to 10mg  q d 2.  Recheck Sna in AM Suprina [place] T

## 2016-05-14 DIAGNOSIS — E222 Syndrome of inappropriate secretion of antidiuretic hormone: Principal | ICD-10-CM

## 2016-05-14 DIAGNOSIS — N390 Urinary tract infection, site not specified: Secondary | ICD-10-CM

## 2016-05-14 LAB — RENAL FUNCTION PANEL
Albumin: 3.1 g/dL — ABNORMAL LOW (ref 3.5–5.0)
Anion gap: 10 (ref 5–15)
BUN: 20 mg/dL (ref 6–20)
CHLORIDE: 96 mmol/L — AB (ref 101–111)
CO2: 26 mmol/L (ref 22–32)
CREATININE: 0.81 mg/dL (ref 0.44–1.00)
Calcium: 9.4 mg/dL (ref 8.9–10.3)
GFR calc Af Amer: >60 mL/min (ref >60)
GFR calc non Af Amer: >60 mL/min (ref >60)
GLUCOSE: 98 mg/dL (ref 65–99)
POTASSIUM: 4.5 mmol/L (ref 3.5–5.1)
Phosphorus: 4.6 mg/dL (ref 2.5–4.6)
Sodium: 132 mmol/L — ABNORMAL LOW (ref 135–145)

## 2016-05-14 LAB — URINALYSIS, ROUTINE W REFLEX MICROSCOPIC
Bilirubin Urine: NEGATIVE
GLUCOSE, UA: NEGATIVE mg/dL
Ketones, ur: NEGATIVE mg/dL
Leukocytes, UA: NEGATIVE
Nitrite: NEGATIVE
PH: 7 (ref 5.0–8.0)
Protein, ur: NEGATIVE mg/dL
SPECIFIC GRAVITY, URINE: 1.015 (ref 1.005–1.030)

## 2016-05-14 LAB — URINE MICROSCOPIC-ADD ON
BACTERIA UA: NONE SEEN
WBC, UA: NONE SEEN WBC/hpf (ref 0–5)

## 2016-05-14 NOTE — Care Management Note (Signed)
Case Management Note  Patient Details  Name: Carla Robles MRN: [id number] Date of Birth: 12/13/1933  Subjective/Objective:                    Action/Plan: Plan is to d/c today.   Expected Discharge Date:     05/03/2016            Expected Discharge Plan:  SNF/ [place] Referral:   CSW  Discharge planning Services  CM Consult    Status of Service:  Completed, signed off      Additional Comments:  Sharin Mons, RN 05/14/2016, 9:56 AM

## 2016-05-14 NOTE — Care Management Important Message (Signed)
Important Message  Patient Details  Name: Carla Robles MRN: [id number] Date of Birth: [birth year]   Medicare Important Message Given:  Yes    [name] May 05/14/2016, 11:16 AM

## 2016-05-14 NOTE — Progress Notes (Signed)
[facility] ROUNDING NOTE   Subjective:   Interval History:  No complaints   Objective:  Vital signs in last 24 hours:  Temp:  [97.5 F (36.4 C)-97.7 F (36.5 C)] 97.7 F (36.5 C) (06/12 0437) Pulse Rate:  [79-85] 80 (06/12 0437) Resp:  [16-18] 16 (06/12 0437) BP: (135-152)/(60-69) 146/69 mmHg (06/12 0437) SpO2:  [93 %-95 %] 94 % (06/12 0437)  Weight change:  Filed Weights   05/04/16 2047 05/05/16 0204  Weight: 63.504 kg (140 lb) 66.497 kg (146 lb 9.6 oz)    Intake/Output: I/O last 3 completed shifts: In: [id number] [P.O.:840; I.V.:3] Out: 3750 [Urine:3750]   Intake/Output this shift:  Total I/O In: -  Out: 300 [Urine:300]  Weight change:  Gen: Awake and alert. Ecchymotic Rt eye/face, improving CVS: RRR Resp: clear Abd: + BS NTND Ext: No edema NEURO: CNI Ox3    Basic Metabolic Panel:  Recent Labs Lab 05/08/16 0434  05/09/16 0543  05/10/16 0500 05/10/16 2129 05/11/16 0545 05/12/16 0600 05/13/16 0514 05/14/16 0559  NA 129*  < > 126*  < > 123* 128* 130* 132* 130* 132*  K 3.5  < > 3.0*  < > 3.9 4.0 4.3 4.4 4.5 4.5  CL 93*  < > 83*  < > 87* 89* 93* 94* 95* 96*  CO2 24  < > 32  < > 29 29 29 29 27 26   GLUCOSE 143*  < > 134*  < > 127* 141* 121* 103* 108* 98  BUN 13  < > 9  < > 13 12 12 14 15 20   CREATININE 0.82  < > 0.83  < > 0.82 0.95 0.79 0.83 0.83 0.81  CALCIUM 8.3*  < > 8.4*  < > 8.5* 9.0 8.7* 9.1 9.3 9.4  PHOS 2.0*  --  2.8  --  3.4  --   --   --   --  4.6  < > = values in this interval not displayed.  Liver Function Tests:  Recent Labs Lab 05/08/16 0434 05/09/16 0543 05/10/16 0500 05/14/16 0559  ALBUMIN 2.8* 3.0* 2.8* 3.1*   No results for input(s): LIPASE, AMYLASE in the last 168 hours. No results for input(s): AMMONIA in the last 168 hours.  CBC:  Recent Labs Lab 05/09/16 0543  WBC 10.9*  HGB 10.6*  HCT 31.3*  MCV 88.7  PLT 267    Cardiac Enzymes: No results for input(s): CKTOTAL, CKMB, CKMBINDEX, TROPONINI in the  last 168 hours.  BNP: Invalid input(s): POCBNP  CBG: No results for input(s): GLUCAP in the last 168 hours.  Microbiology: Results for orders placed or performed during the hospital encounter of 05/04/16  Urine culture     Status: Abnormal   Collection Time: 05/04/16 10:50 PM  Result Value Ref Range Status   Specimen Description URINE, CLEAN CATCH  Final   Special Requests NONE  Final   Culture MULTIPLE SPECIES PRESENT, SUGGEST RECOLLECTION (A)  Final   Report Status 05/06/2016 FINAL  Final  Culture, Urine     Status: None   Collection Time: 05/06/16  4:06 PM  Result Value Ref Range Status   Specimen Description URINE, RANDOM  Final   Special Requests Normal  Final   Culture NO GROWTH  Final   Report Status 05/07/2016 FINAL  Final  MRSA PCR Screening     Status: None   Collection Time: 05/07/16  6:32 AM  Result Value Ref Range Status   MRSA by PCR NEGATIVE NEGATIVE Final  Comment:        The GeneXpert MRSA Assay (FDA approved for NASAL specimens only), is one component of a comprehensive MRSA colonization surveillance program. It is not intended to diagnose MRSA infection nor to guide or monitor treatment for MRSA infections.     Coagulation Studies: No results for input(s): LABPROT, INR in the last 72 hours.  Urinalysis: No results for input(s): COLORURINE, LABSPEC, PHURINE, GLUCOSEU, HGBUR, BILIRUBINUR, KETONESUR, PROTEINUR, UROBILINOGEN, NITRITE, LEUKOCYTESUR in the last 72 hours.  Invalid input(s): APPERANCEUR    Imaging: No results found.   Medications:     . aspirin  81 mg Oral Daily  . calcium-vitamin D  1 tablet Oral Daily  . demeclocycline  150 mg Oral Q12H  . donepezil  10 mg Oral QHS  . enoxaparin (LOVENOX) injection  40 mg Subcutaneous Q24H  . furosemide  10 mg Oral Daily  . multivitamin with minerals  1 tablet Oral Daily  . omega-3 acid ethyl esters  1 g Oral Daily  . potassium chloride  40 mEq Oral BID  . sodium chloride  500 mL  Intravenous Once  . sodium chloride flush  10-40 mL Intracatheter Q12H  . sodium chloride flush  3 mL Intravenous Q12H   acetaminophen **OR** acetaminophen, guaiFENesin-dextromethorphan, hydrALAZINE, HYDROmorphone (DILAUDID) injection, ondansetron **OR** ondansetron (ZOFRAN) IV, oxyCODONE, sodium chloride flush  Assessment/ Plan:  Assessment: 1. Hyponatremia, SNa sl low and stable. UO excellent  stop lasix today   2. HypoPO4, improved 3. Hypokalemia, improved  Will sign off today  Call  [phone] for further help    LOS: 10 Brittanny Levenhagen W @TODAY @10 :19 AM

## 2016-05-14 NOTE — Progress Notes (Signed)
Physical Therapy Treatment Patient Details Name: Carla Robles MRN: [id number] DOB: 1934-11-30 Today's Date: 05/14/2016    History of Present Illness Pt is a 82 y/o F who presented to the ED w/ c/o increased weakness and falls w/ a fall last night after running into the corner of the door when leaving the bathroom w/ the lights out.  She was found to be hyponatremic on admission.  Pt's PMH includes dementia, breast cancer and breast lumpectomy, NSTEMI, SIADH.    PT Comments    Patient currently min guard/min A for OOB mobility without AD. Continues to be unsteady and anxious with ambulation. Instructed pt and husband to continue using RW upon d/c home for safe ambulation. Current plan remains appropriate.   Follow Up Recommendations  Home health PT;Supervision/Assistance - 24 hour     Equipment Recommendations  None recommended by PT    Recommendations for Other Services OT consult     Precautions / Restrictions Precautions Precautions: Fall Restrictions Weight Bearing Restrictions: No    Mobility  Bed Mobility Overal bed mobility: Modified Independent             General bed mobility comments: Increased time   Transfers Overall transfer level: Needs assistance Equipment used: 1 person hand held assist Transfers: Sit to/from Stand Sit to Stand: Min guard;Min assist         General transfer comment: min guard to stand and min A upon standing with HHA for balance; no LOB but a little shaky with standing; cues for posture; pt with flexed bilat knees in standing  Ambulation/Gait Ambulation/Gait assistance: Min assist Ambulation Distance (Feet): 150 Feet Assistive device: 1 person hand held assist Gait Pattern/deviations: Step-through pattern;Decreased stride length;Trunk flexed Gait velocity: decreased   General Gait Details: pt unsteady at times especially with changes in direction; HHA for balance; no LOB but pt very anxious about ambulating; cues for posture,  bilat step length, and cadence   Stairs            Wheelchair Mobility    Modified Rankin (Stroke Patients Only)       Balance Overall balance assessment: Needs assistance Sitting-balance support: No upper extremity supported;Feet supported Sitting balance-Leahy Scale: Fair     Standing balance support: Single extremity supported;During functional activity Standing balance-Leahy Scale: Poor                      Cognition Arousal/Alertness: Awake/alert Behavior During Therapy: WFL for tasks assessed/performed Overall Cognitive Status: History of cognitive impairments - at baseline                      Exercises General Exercises - Lower Extremity Ankle Circles/Pumps: Strengthening;Both;15 reps;Seated Long Arc Quad: Strengthening;Both;15 reps Hip ABduction/ADduction: Strengthening;Both;15 reps Hip Flexion/Marching: Strengthening;Both;15 reps;Seated    General Comments        Pertinent Vitals/Pain Pain Assessment: No/denies pain    Home Living                      Prior Function            PT Goals (current goals can now be found in the care plan section) Acute Rehab PT Goals Patient Stated Goal: get strength back and return home PT Goal Formulation: With patient/family Time For Goal Achievement: 05/24/16 Potential to Achieve Goals: Good Progress towards PT goals: Progressing toward goals    Frequency  Min 3X/week    PT Plan Current plan remains  appropriate    Co-evaluation             End of Session Equipment Utilized During Treatment: Gait belt Activity Tolerance: Patient tolerated treatment well;Patient limited by fatigue Patient left: with call bell/phone within reach;in chair;with chair alarm set;with family/visitor present     Time: [id number] PT Time Calculation (min) (ACUTE ONLY): 29 min  Charges:  $Gait Training: 8-22 mins $Therapeutic Exercise: 8-22 mins                    G Codes:      Salina April, PTA Pager: [phone]   05/14/2016, 11:27 AM

## 2016-05-14 NOTE — Progress Notes (Signed)
PROGRESS NOTE  Carla Robles [id number] DOB: 10-Nov-1934 DOA: 05/04/2016 PCP: Rachell Cipro, MD   LOS: 10 days   Brief Narrative: 82 y.o. female with a remote hx of Breast Cancer, SIADH, CAD, HTN, and Hyperlipidemia who presented to the [facility] ED with complaints of increased weakness and falls, with a fall last night in which she hit her head on the corner of a table. She was found to be hyponatremic with sodium of 120 on admission. Patient's initial CXR showed vascular congestion and received Lasix with excellent UOP however sodium decreased further, was placed on fluid restriction with ongoing worsening. Nephrology consulted and patient transferred to SDU on 8/4 evening for hypertonic saline. With improvement her 3% Na discontinued 6/5 pm. Demeclocycline started on 6/8. Sodium improved to 132 today.   Assessment & Plan: Principal Problem:   Hyponatremia Active Problems:   Essential hypertension   Mild cognitive impairment   Urinary tract infection, site not specified   Pulmonary vascular congestion   SIADH (syndrome of inappropriate ADH production) (HCC)   Hypercholesteremia   Hyponatremia due to SIADH - for the first time this admission, her sodium improved to 130- 132 after starting demeclocycline. Appreciate nephrology assistance.  - Na improving, discontinued  hypertonic saline - plan to d/c the trpiple lumen after we get a peripheral line.  - LASIX decreased to 10 mg daily, diuresed about 2 lit /24 hours. Will d/c lasix today and watch her sodium in am.  - pt requesting to drink water,  Told her fluid restriction to 1200 ml per day.    Mild pulmonary edema on CXR due to acute on chronic diastolic CHF - 2D echo with normal EF, grade 2 DD - on room air - euvolemic currently   UTI  - cultures pending, reviewed today with "multiple species, suggesting recollection". Favor treatment for 5 days. -completed the treatment   Hypotension: Discontinued the labetalol and   Lisnopril. bp parameters improved.   Orthostatic hypotension:  Ordered ted hoses thigh high, educated her regarding ambulation from lying to standing.   Cognitive impairment  - Aricept   Hypokalemia  replete as needed and repeat in am.  Urinary retention: 3 times in and out cath has been done and plan to do a foley catheter placement today.  Repeat UA will be sent. To rule out infection.  Voiding trial done today and she failed, so foley catheter was put back in.   DVT prophylaxis: Lovenox Code Status: Full Family Communication: discussed with husband at bedside.  Disposition Plan: possibly home in a day or two.   Consultants:   Nephrology  Procedures:   None   Antimicrobials:  Ceftriaxone 6/3 >>   Subjective: She is slightly confused today.   Objective: Filed Vitals:   05/13/16 2322 05/14/16 0437 05/14/16 1131 05/14/16 1530  BP: 152/68 146/69  139/67  Pulse: 81 80 85 86  Temp: 97.5 F (36.4 C) 97.7 F (36.5 C) 98.1 F (36.7 C) 98 F (36.7 C)  TempSrc: Oral Oral Oral Oral  Resp: 16 16 16 16   Height:      Weight:      SpO2: 95% 94% 96%     Intake/Output Summary (Last 24 hours) at 05/14/16 1647 Last data filed at 05/14/16 1422  Gross per 24 hour  Intake    540 ml  Output   1625 ml  Net  -1085 ml   Filed Weights   05/04/16 2047 05/05/16 0204  Weight: 63.504 kg (140 lb) 66.497 kg (146  lb 9.6 oz)    Examination: Constitutional: NAD Filed Vitals:   05/13/16 2322 05/14/16 0437 05/14/16 1131 05/14/16 1530  BP: 152/68 146/69  139/67  Pulse: 81 80 85 86  Temp: 97.5 F (36.4 C) 97.7 F (36.5 C) 98.1 F (36.7 C) 98 F (36.7 C)  TempSrc: Oral Oral Oral Oral  Resp: 16 16 16 16   Height:      Weight:      SpO2: 95% 94% 96%    Eyes: PERRL,large ecchymotic area periorbital on the right Respiratory: clear to auscultation bilaterally, no wheezing, no crackles.  Cardiovascular: Regular rate and rhythm, no murmurs / rubs / gallops. No LE edema Abdomen: no  tenderness. Bowel sounds positive.  Neurologic: Nonfocal Psychiatric: Alert and oriented to time and person, not to place.   Data Reviewed: I have personally reviewed following labs and imaging studies  CBC:  Recent Labs Lab 05/09/16 0543  WBC 10.9*  HGB 10.6*  HCT 31.3*  MCV 88.7  PLT 99991111   Basic Metabolic Panel:  Recent Labs Lab 05/08/16 0434  05/09/16 0543  05/10/16 0500 05/10/16 2129 05/11/16 0545 05/12/16 0600 05/13/16 0514 05/14/16 0559  NA 129*  < > 126*  < > 123* 128* 130* 132* 130* 132*  K 3.5  < > 3.0*  < > 3.9 4.0 4.3 4.4 4.5 4.5  CL 93*  < > 83*  < > 87* 89* 93* 94* 95* 96*  CO2 24  < > 32  < > 29 29 29 29 27 26   GLUCOSE 143*  < > 134*  < > 127* 141* 121* 103* 108* 98  BUN 13  < > 9  < > 13 12 12 14 15 20   CREATININE 0.82  < > 0.83  < > 0.82 0.95 0.79 0.83 0.83 0.81  CALCIUM 8.3*  < > 8.4*  < > 8.5* 9.0 8.7* 9.1 9.3 9.4  PHOS 2.0*  --  2.8  --  3.4  --   --   --   --  4.6  < > = values in this interval not displayed. GFR: Estimated Creatinine Clearance: 52.3 mL/min (by C-G formula based on Cr of 0.81). Liver Function Tests:  Recent Labs Lab 05/08/16 0434 05/09/16 0543 05/10/16 0500 05/14/16 0559  ALBUMIN 2.8* 3.0* 2.8* 3.1*   No results for input(s): LIPASE, AMYLASE in the last 168 hours. No results for input(s): AMMONIA in the last 168 hours. Coagulation Profile: No results for input(s): INR, PROTIME in the last 168 hours. Cardiac Enzymes: No results for input(s): CKTOTAL, CKMB, CKMBINDEX, TROPONINI in the last 168 hours. BNP (last 3 results) No results for input(s): PROBNP in the last 8760 hours. HbA1C: No results for input(s): HGBA1C in the last 72 hours. CBG: No results for input(s): GLUCAP in the last 168 hours. Lipid Profile: No results for input(s): CHOL, HDL, LDLCALC, TRIG, CHOLHDL, LDLDIRECT in the last 72 hours. Thyroid Function Tests: No results for input(s): TSH, T4TOTAL, FREET4, T3FREE, THYROIDAB in the last 72 hours. Anemia  Panel: No results for input(s): VITAMINB12, FOLATE, FERRITIN, TIBC, IRON, RETICCTPCT in the last 72 hours. Urine analysis:    Component Value Date/Time   COLORURINE YELLOW 05/04/2016 2250   APPEARANCEUR TURBID* 05/04/2016 2250   LABSPEC 1.016 05/04/2016 2250   PHURINE 7.5 05/04/2016 2250   GLUCOSEU 100* 05/04/2016 2250   HGBUR LARGE* 05/04/2016 2250   BILIRUBINUR NEGATIVE 05/04/2016 2250   KETONESUR NEGATIVE 05/04/2016 2250   PROTEINUR 30* 05/04/2016 2250   UROBILINOGEN  0.2 08/05/2013 1850   NITRITE NEGATIVE 05/04/2016 2250   LEUKOCYTESUR LARGE* 05/04/2016 2250   Sepsis Labs: Invalid input(s): PROCALCITONIN, LACTICIDVEN  Recent Results (from the past 240 hour(s))  Urine culture     Status: Abnormal   Collection Time: 05/04/16 10:50 PM  Result Value Ref Range Status   Specimen Description URINE, CLEAN CATCH  Final   Special Requests NONE  Final   Culture MULTIPLE SPECIES PRESENT, SUGGEST RECOLLECTION (A)  Final   Report Status 05/06/2016 FINAL  Final  Culture, Urine     Status: None   Collection Time: 05/06/16  4:06 PM  Result Value Ref Range Status   Specimen Description URINE, RANDOM  Final   Special Requests Normal  Final   Culture NO GROWTH  Final   Report Status 05/07/2016 FINAL  Final  MRSA PCR Screening     Status: None   Collection Time: 05/07/16  6:32 AM  Result Value Ref Range Status   MRSA by PCR NEGATIVE NEGATIVE Final    Comment:        The GeneXpert MRSA Assay (FDA approved for NASAL specimens only), is one component of a comprehensive MRSA colonization surveillance program. It is not intended to diagnose MRSA infection nor to guide or monitor treatment for MRSA infections.       Radiology Studies: No results found.   Scheduled Meds: . aspirin  81 mg Oral Daily  . calcium-vitamin D  1 tablet Oral Daily  . donepezil  10 mg Oral QHS  . enoxaparin (LOVENOX) injection  40 mg Subcutaneous Q24H  . multivitamin with minerals  1 tablet Oral Daily    . omega-3 acid ethyl esters  1 g Oral Daily  . potassium chloride  40 mEq Oral BID  . sodium chloride  500 mL Intravenous Once  . sodium chloride flush  10-40 mL Intracatheter Q12H  . sodium chloride flush  3 mL Intravenous Q12H   Continuous Infusions:     Hosie Poisson, MD,  [facility] Pager [phone]  If 7PM-7AM, please contact night-coverage [email or web address] Password [facility] 05/14/2016, 4:47 PM

## 2016-05-15 DIAGNOSIS — E222 Syndrome of inappropriate secretion of antidiuretic hormone: Secondary | ICD-10-CM | POA: Diagnosis not present

## 2016-05-15 DIAGNOSIS — J811 Chronic pulmonary edema: Secondary | ICD-10-CM | POA: Diagnosis not present

## 2016-05-15 DIAGNOSIS — R5381 Other malaise: Secondary | ICD-10-CM | POA: Diagnosis not present

## 2016-05-15 DIAGNOSIS — Z5189 Encounter for other specified aftercare: Secondary | ICD-10-CM | POA: Diagnosis not present

## 2016-05-15 DIAGNOSIS — M6281 Muscle weakness (generalized): Secondary | ICD-10-CM | POA: Diagnosis not present

## 2016-05-15 DIAGNOSIS — D649 Anemia, unspecified: Secondary | ICD-10-CM | POA: Diagnosis not present

## 2016-05-15 DIAGNOSIS — I10 Essential (primary) hypertension: Secondary | ICD-10-CM | POA: Diagnosis not present

## 2016-05-15 DIAGNOSIS — Z139 Encounter for screening, unspecified: Secondary | ICD-10-CM | POA: Diagnosis not present

## 2016-05-15 DIAGNOSIS — E871 Hypo-osmolality and hyponatremia: Secondary | ICD-10-CM | POA: Diagnosis not present

## 2016-05-15 DIAGNOSIS — Z9181 History of falling: Secondary | ICD-10-CM | POA: Diagnosis not present

## 2016-05-15 DIAGNOSIS — R338 Other retention of urine: Secondary | ICD-10-CM | POA: Diagnosis not present

## 2016-05-15 DIAGNOSIS — R2681 Unsteadiness on feet: Secondary | ICD-10-CM | POA: Diagnosis not present

## 2016-05-15 DIAGNOSIS — D72829 Elevated white blood cell count, unspecified: Secondary | ICD-10-CM | POA: Diagnosis not present

## 2016-05-15 DIAGNOSIS — R339 Retention of urine, unspecified: Secondary | ICD-10-CM | POA: Diagnosis not present

## 2016-05-15 DIAGNOSIS — E870 Hyperosmolality and hypernatremia: Secondary | ICD-10-CM | POA: Diagnosis not present

## 2016-05-15 DIAGNOSIS — G3184 Mild cognitive impairment, so stated: Secondary | ICD-10-CM | POA: Diagnosis not present

## 2016-05-15 DIAGNOSIS — R259 Unspecified abnormal involuntary movements: Secondary | ICD-10-CM | POA: Diagnosis not present

## 2016-05-15 DIAGNOSIS — R41841 Cognitive communication deficit: Secondary | ICD-10-CM | POA: Diagnosis not present

## 2016-05-15 DIAGNOSIS — E46 Unspecified protein-calorie malnutrition: Secondary | ICD-10-CM | POA: Diagnosis not present

## 2016-05-15 DIAGNOSIS — I5032 Chronic diastolic (congestive) heart failure: Secondary | ICD-10-CM | POA: Diagnosis not present

## 2016-05-15 DIAGNOSIS — R4189 Other symptoms and signs involving cognitive functions and awareness: Secondary | ICD-10-CM | POA: Diagnosis not present

## 2016-05-15 DIAGNOSIS — E7800 Pure hypercholesterolemia, unspecified: Secondary | ICD-10-CM | POA: Diagnosis not present

## 2016-05-15 MED ORDER — FLUCONAZOLE 100 MG PO TABS
150.0000 mg | ORAL_TABLET | ORAL | Status: DC
  Administered 2016-05-15: 150 mg via ORAL
  Filled 2016-05-15: qty 2

## 2016-05-15 MED ORDER — FLUCONAZOLE 150 MG PO TABS: 150.0000 mg | ORAL_TABLET | ORAL | Status: DC

## 2016-05-15 NOTE — Progress Notes (Signed)
Patient will DC to: [place] Anticipated DC date: 05/15/16 Family notified: Spouse Transport by: Corey Harold   Per MD patient ready for DC to [place]. RN, patient, patient's family, and facility notified of DC. RN given number for report. DC packet on chart. Ambulance transport requested for patient.   CSW signing off.  Cedric Fishman, [place] Social Worker [phone]

## 2016-05-15 NOTE — Progress Notes (Signed)
   05/15/16 1200  Clinical Encounter Type  Visited With Patient and family together  Visit Type Initial  Spiritual Encounters  Spiritual Needs Emotional  Stress Factors  Patient Stress Factors Health changes;Major life changes  Family Stress Factors Health changes;Major life changes   Chaplain was doing rounds and stopped in to visit with pt. Pt has been hospitalized for over 10 days.  Chaplain wanted to assess pt's mindset. Husband was present in the room.  Chaplain spoke to pt and husband about life issues.  Both were in positive spirits and looking forward to being released from hospital.  Pt and husband requested that she be transferred to a rehabilitation facility for her follow up care. Chaplain offered ministry of presence to family and pt.  Carla Robles 05/15/2016 12:15 PM [id number]

## 2016-05-15 NOTE — Clinical Social Work Note (Signed)
Clinical Social Work Assessment  Patient Details  Name: Carla Robles MRN: [id number] Date of Birth: 1934-04-18  Date of referral:  05/15/16               Reason for consult:  Facility Placement                Permission sought to share information with:  Facility Sport and exercise psychologist, Family Supports Permission granted to share information::  Yes, Verbal Permission Granted  Name::        Agency::     Relationship::  Spouse  Contact Information:     Housing/Transportation Living arrangements for the past 2 months:  Single Family Home Source of Information:  Patient, Spouse Patient Interpreter Needed:  None Criminal Activity/Legal Involvement Pertinent to Current Situation/Hospitalization:  No - Comment as needed Significant Relationships:  Spouse Lives with:  Spouse Do you feel safe going back to the place where you live?  No Need for family participation in patient care:  Yes (Comment)  Care giving concerns:  CSW consulted regarding SNF placement. Patient and patient's husband at bedside are not comfortable going home at this time with patient's medical needs. CSW to continue to follow.   Social Worker assessment / plan:  CSW spoke with patient and patient's spouse concerning possibility of rehab at [facility] before returning home.  Employment status:  Retired Health visitor PT Recommendations:  Home with [place], [place] / Referral to community resources:  [place]  Patient/Family's Response to care:  Patient and patient's husband recognize need for rehab before returning home and are agreeable to a SNF in [place]. Patient reported preference for [facility].  Patient/Family's Understanding of and Emotional Response to Diagnosis, Current Treatment, and Prognosis:  Patient is realistic regarding therapy needs. No questions/concerns about plan or treatment.    Emotional Assessment Appearance:  Appears stated  age Attitude/Demeanor/Rapport:   (Appropriate) Affect (typically observed):  Accepting, Pleasant, Appropriate Orientation:  Oriented to Situation, Oriented to  Time, Oriented to Place, Oriented to Self Alcohol / Substance use:  Not Applicable Psych involvement (Current and /or in the community):  No (Comment)  Discharge Needs  Concerns to be addressed:  Care Coordination Readmission within the last 30 days:  No Current discharge risk:  None Barriers to Discharge:  No Barriers Identified   Benard Halsted, [place] 05/15/2016, 11:12 AM

## 2016-05-15 NOTE — Progress Notes (Signed)
Attempted to call report to [facility] [phone]) receptionist gave me another number ([phone]) to call but no one answered. I left Voice mail with my number for nurse to return call for report

## 2016-05-15 NOTE — Clinical Social Work Placement (Signed)
   CLINICAL SOCIAL WORK PLACEMENT  NOTE  Date:  05/15/2016  Patient Details  Name: Carla Robles MRN: [id number] Date of Birth: Feb 20, 1934  Clinical Social Work is seeking post-discharge placement for this patient at the [place] level of care (*CSW will initial, date and re-position this form in  chart as items are completed):  Yes   Patient/family provided with [place] Work Department's list of facilities offering this level of care within the geographic area requested by the patient (or if unable, by the patient's family).  Yes   Patient/family informed of their freedom to choose among providers that offer the needed level of care, that participate in Medicare, Medicaid or managed care program needed by the patient, have an available bed and are willing to accept the patient.  Yes   Patient/family informed of [facility]'s ownership interest in [facility] and [facility], as well as of the fact that they are under no obligation to receive care at these facilities.  PASRR submitted to EDS on 05/15/16     PASRR number received on 05/15/16     Existing PASRR number confirmed on       FL2 transmitted to all facilities in geographic area requested by pt/family on 05/15/16     FL2 transmitted to all facilities within larger geographic area on       Patient informed that his/her managed care company has contracts with or will negotiate with certain facilities, including the following:        Yes   Patient/family informed of bed offers received.  Patient chooses bed at [facility]     Physician recommends and patient chooses bed at      Patient to be transferred to [facility] on 05/15/16.  Patient to be transferred to facility by [facility]     Patient family notified on 05/15/16 of transfer.  Name of family member notified:  Spouse at bedside     PHYSICIAN Please prepare priority discharge summary, including medications, Please sign  FL2     Additional Comment:    _______________________________________________ Benard Halsted, [place] 05/15/2016, 11:14 AM

## 2016-05-15 NOTE — NC FL2 (Signed)
[place] MEDICAID FL2 LEVEL OF CARE SCREENING TOOL     IDENTIFICATION  Patient Name: Carla Robles Birthdate: 1934/09/05 Sex: female Admission Date (Current Location): 05/04/2016  [facility] and [place] Number:  Herbalist and Address:  [facility] [place], [place] [address] [place], [place] [address]      Provider Number: [id number]  Attending Physician Name and Address:  Hosie Poisson, MD  Relative Name and Phone Number:  Carloyn Manner, spouse, [phone]    Current Level of Care: Hospital Recommended Level of Care: [place] Prior Approval Number:    Date Approved/Denied:   PASRR Number: [id number]  Discharge Plan: SNF    Current Diagnoses: Patient Active Problem List   Diagnosis Date Noted  . Urinary tract infection, site not specified 05/05/2016  . Pulmonary vascular congestion 05/05/2016  . SIADH (syndrome of inappropriate ADH production) ([place]) 05/05/2016  . Hypercholesteremia 05/05/2016  . Hyponatremia 05/04/2016  . Mild cognitive impairment 07/07/2015  . Personal history of  adenomatouscolonic polyps 04/22/2012  . Cardiac enzymes elevated 09/07/2011  . Essential hypertension 07/13/2011  . Hyperlipidemia 07/13/2011    Orientation RESPIRATION BLADDER Height & Weight     Self, Time, Place, Situation  Normal Continent, Indwelling catheter (Urinary catheter) Weight: 66.497 kg (146 lb 9.6 oz) Height:  5' 3.5" (161.3 cm)  BEHAVIORAL SYMPTOMS/MOOD NEUROLOGICAL BOWEL NUTRITION STATUS      Continent Diet (Please see DC summary)  AMBULATORY STATUS COMMUNICATION OF NEEDS Skin   Limited Assist Verbally Bruising (On Face)                       Personal Care Assistance Level of Assistance  Bathing, Feeding, Dressing Bathing Assistance: Limited assistance Feeding assistance: Independent Dressing Assistance: Limited assistance     Functional Limitations Info             SPECIAL CARE FACTORS FREQUENCY  PT (By licensed PT)     PT Frequency: min 3x/week              Contractures      Additional Factors Info  Code Status, Allergies Code Status Info: Full Allergies Info: Ace Inhibitors, Other, Statins           Current Medications (05/15/2016):  This is the current hospital active medication list Current Facility-Administered Medications  Medication Dose Route Frequency Provider Last Rate Last Dose  . acetaminophen (TYLENOL) tablet 650 mg  650 mg Oral Q6H PRN Theressa Millard, MD       Or  . acetaminophen (TYLENOL) suppository 650 mg  650 mg Rectal Q6H PRN Theressa Millard, MD      . aspirin chewable tablet 81 mg  81 mg Oral Daily Theressa Millard, MD   81 mg at 05/15/16 0926  . calcium-vitamin D (OSCAL WITH D) 500-200 MG-UNIT per tablet 1 tablet  1 tablet Oral Daily Theressa Millard, MD   1 tablet at 05/15/16 0926  . donepezil (ARICEPT) tablet 10 mg  10 mg Oral QHS Theressa Millard, MD   10 mg at 05/14/16 2152  . enoxaparin (LOVENOX) injection 40 mg  40 mg Subcutaneous Q24H Wendee Beavers, RPH   40 mg at 05/14/16 1425  . guaiFENesin-dextromethorphan (ROBITUSSIN DM) 100-10 MG/5ML syrup 5 mL  5 mL Oral Q4H PRN Caren Griffins, MD   5 mL at 05/07/16 1246  . hydrALAZINE (APRESOLINE) injection 5 mg  5 mg Intravenous Q4H PRN Gardiner Barefoot, NP  5 mg at 05/15/16 0534  . HYDROmorphone (DILAUDID) injection 0.5-1 mg  0.5-1 mg Intravenous Q3H PRN Theressa Millard, MD      . multivitamin with minerals tablet 1 tablet  1 tablet Oral Daily Theressa Millard, MD   1 tablet at 05/15/16 0926  . omega-3 acid ethyl esters (LOVAZA) capsule 1 g  1 g Oral Daily Theressa Millard, MD   1 g at 05/15/16 0926  . ondansetron (ZOFRAN) tablet 4 mg  4 mg Oral Q6H PRN Theressa Millard, MD       Or  . ondansetron (ZOFRAN) injection 4 mg  4 mg Intravenous Q6H PRN Theressa Millard, MD      . oxyCODONE (Oxy IR/ROXICODONE) immediate release tablet 5 mg  5 mg Oral Q4H PRN Theressa Millard, MD   5 mg at 05/09/16  0911  . potassium chloride SA (K-DUR,KLOR-CON) CR tablet 40 mEq  40 mEq Oral BID Fleet Contras, MD   40 mEq at 05/15/16 0926  . sodium chloride 0.9 % bolus 500 mL  500 mL Intravenous Once Hosie Poisson, MD      . sodium chloride flush (NS) 0.9 % injection 10-40 mL  10-40 mL Intracatheter Q12H Hosie Poisson, MD      . sodium chloride flush (NS) 0.9 % injection 10-40 mL  10-40 mL Intracatheter PRN Hosie Poisson, MD      . sodium chloride flush (NS) 0.9 % injection 3 mL  3 mL Intravenous Q12H Theressa Millard, MD   3 mL at 05/14/16 2153     Discharge Medications: Please see discharge summary for a list of discharge medications.  Relevant Imaging Results:  Relevant Lab Results:   Additional Information SSN: Angier O[place], [place]

## 2016-05-15 NOTE — Discharge Summary (Signed)
Physician Discharge Summary  Carla Robles [id number] DOB: Aug 01, 1934 DOA: 05/04/2016  PCP: Rachell Cipro, MD  Admit date: 05/04/2016 Discharge date: 05/15/2016  Admitted From: home  Disposition:  SNF  Recommendations for Outpatient Follow-up:  1. Follow up with PCP in 1-2 weeks 2. Please obtain BMP/CBC in one week 3. Please follow up with [facility] for urinary retention.      Discharge Condition:GUARDED CODE STATUS:full  Diet recommendation: Heart Healthy  Brief/Interim Summary: 82 y.o. female with a remote hx of Breast Cancer, SIADH, CAD, HTN, and Hyperlipidemia who presented to the [facility] ED with complaints of increased weakness and falls, with a fall last night in which she hit her head on the corner of a table. She was found to be hyponatremic with sodium of 120 on admission. Patient's initial CXR showed vascular congestion and received Lasix with excellent UOP however sodium decreased further, was placed on fluid restriction with ongoing worsening. Nephrology consulted and patient transferred to SDU on 8/4 evening for hypertonic saline. With improvement her 3% Na discontinued 6/5 pm. Demeclocycline started on 6/8. Sodium improved to 132 today.  Discharge Diagnoses:  Principal Problem:   Hyponatremia Active Problems:   Essential hypertension   Mild cognitive impairment   Urinary tract infection, site not specified   Pulmonary vascular congestion   SIADH (syndrome of inappropriate ADH production) (HCC)   Hypercholesteremia  Hyponatremia due to SIADH - for the first time this admission, her sodium improved to 130- 132 after starting demeclocycline. Appreciate nephrology assistance.  - Na improving, discontinued hypertonic saline  - discontinued the lasix and watched her overnight.  - sodium remained stable at 132.    Mild pulmonary edema on CXR due to acute on chronic diastolic CHF - 2D echo with normal EF, grade 2 DD - on room air - euvolemic currently    UTI  - cultures pending, reviewed today with "multiple species, suggesting recollection". Favor treatment for 5 days. -completed the treatment   Hypotension: Discontinued the labetalol and Lisnopril. bp parameters improved.   Orthostatic hypotension:  Ordered ted hoses thigh high, educated her regarding ambulation from lying to standing.  She remains asymptomatic with the drop in blood pressure initially.    Cognitive impairment  - Aricept   Hypokalemia replete as needed and repeat in am is normal.  Urinary retention: 3 times in and out cath has been done and plan to do a foley catheter placement today. UA repeated and negative for infection.  Voiding trial done today and she failed, so foley catheter was put back in.  Recommend outpatient urology follow up with [facility] for voiding trial as outpatient.   Tinia Corporis: Diflucan for 4 weeks ordered.   Discharge Instructions      Discharge Instructions    Diet - low sodium heart healthy    Complete by:  As directed      Discharge instructions    Complete by:  As directed   Please follow up with your PCP in one week.  Fluid restriction to 1270ml per day.            Medication List    STOP taking these medications        lisinopril 5 MG tablet  Commonly known as:  PRINIVIL,ZESTRIL      TAKE these medications        albuterol 108 (90 Base) MCG/ACT inhaler  Commonly known as:  PROVENTIL HFA;VENTOLIN HFA  Inhale 2 puffs into the lungs every 6 (six) hours as  needed for wheezing or shortness of breath.     aspirin 81 MG tablet  Take 81 mg by mouth daily.     CALTRATE 600+D 600-400 MG-UNIT chew tablet  Generic drug:  Calcium Carbonate-Vitamin D  Chew 1 tablet by mouth 2 (two) times daily.     donepezil 10 MG tablet  Commonly known as:  ARICEPT  Take 10 mg by mouth at bedtime.     fish oil-omega-3 fatty acids 1000 MG capsule  Take 1 g by mouth daily.     fluconazole 150 MG tablet  Commonly  known as:  DIFLUCAN  Take 1 tablet (150 mg total) by mouth once a week.     multivitamin tablet  Take 1 tablet by mouth daily.       Follow-up Information    Follow up with [facility], [facility]. Schedule an appointment as soon as possible for a visit in 1 week.   Specialty:  Family Medicine   Contact information:   [address] [place] [address] [phone]       Follow up with [facility] In 1 week.   Why:  for urinary retention, foley catheter in place.    Contact information:   [address]  [address] [place] [address] [phone]      Allergies  Allergen Reactions  . Ace Inhibitors Other (See Comments)    Hyponatremia  . Other     hmg coa redustase inhibitors - muscle aches  . Statins Other (See Comments)    Muscle aches     Consultations:  Nephrology.    Procedures/Studies: Dg Chest 2 View  05/04/2016  CLINICAL DATA:  Acute onset of cough.  Initial encounter. EXAM: CHEST  2 VIEW COMPARISON:  Chest radiograph performed 04/20/2016 FINDINGS: The lungs are well-aerated. Mild vascular congestion is noted. Mild bibasilar atelectasis is seen. There is no evidence of pleural effusion or pneumothorax. The heart is normal in size; the mediastinal contour is within normal limits. No acute osseous abnormalities are seen. Clips are seen at the left axilla. IMPRESSION: Mild vascular congestion noted.  Mild bibasilar atelectasis seen. Electronically Signed   By: Garald Balding M.D.   On: 05/04/2016 22:49   Dg Chest 2 View  04/20/2016  CLINICAL DATA:  Cough for over a week, sore throat, hypertension, breast cancer post lumpectomy, former smoker EXAM: CHEST  2 VIEW COMPARISON:  08/05/2013 FINDINGS: Normal heart size, mediastinal contours, and pulmonary vascularity. Lungs hyperinflated but clear. No infiltrate, pleural effusion or pneumothorax. Bones demineralized Post LEFT breast surgery and question axillary node dissection. IMPRESSION:  Emphysematous changes without acute infiltrate. Electronically Signed   By: Lavonia Dana M.D.   On: 04/20/2016 14:21   Ct Head Wo Contrast  05/04/2016  CLINICAL DATA:  Fall with head injury. Right-sided facial bruising. Initial encounter. EXAM: CT HEAD WITHOUT CONTRAST CT MAXILLOFACIAL WITHOUT CONTRAST TECHNIQUE: Multidetector CT imaging of the head and maxillofacial structures were performed using the standard protocol without intravenous contrast. Multiplanar CT image reconstructions of the maxillofacial structures were also generated. COMPARISON:  Brain MRI 04/29/2015 FINDINGS: CT HEAD FINDINGS Skull and Sinuses:Facial findings described below. Right frontal scalp and forehead hematoma without calvarial fracture. Brain: No evidence of acute infarction, hemorrhage, hydrocephalus, or mass lesion/mass effect. Atrophy with ventriculomegaly, stable from 2016. Mild to moderate chronic microvascular disease with ischemic gliosis confluent around the frontal horns of the lateral ventricles. CT MAXILLOFACIAL FINDINGS The inferior mandibular symphysis is not visualized, but no  signs of injury at this level. Right forehead hematoma without facial fracture or mandibular dislocation. No hemo sinus. Bilateral cataract resection. No evidence of globe or other postseptal injury. Right lower second premolar has a large periapical erosion but has undergone root canal. IMPRESSION: 1. No evidence of intracranial injury. 2. Right forehead hematoma without underlying fracture. Electronically Signed   By: Monte Fantasia M.D.   On: 05/04/2016 22:56   Dg Chest Port 1 View  05/06/2016  CLINICAL DATA:  Encounter for central line placement EXAM: PORTABLE CHEST 1 VIEW COMPARISON:  05/04/2016 FINDINGS: Left IJ central line with tip at the SVC. Normal heart size and stable mediastinal contours when allowing for differences in technique. Low volume chest with interstitial crowding. Stable biapical pleural thickening. Postoperative changes  left breast and axilla. IMPRESSION: 1. New left IJ catheter without adverse finding. 2. Low volume chest. Electronically Signed   By: Monte Fantasia M.D.   On: 05/06/2016 22:50   Ct Maxillofacial Wo Cm  05/04/2016  CLINICAL DATA:  Fall with head injury. Right-sided facial bruising. Initial encounter. EXAM: CT HEAD WITHOUT CONTRAST CT MAXILLOFACIAL WITHOUT CONTRAST TECHNIQUE: Multidetector CT imaging of the head and maxillofacial structures were performed using the standard protocol without intravenous contrast. Multiplanar CT image reconstructions of the maxillofacial structures were also generated. COMPARISON:  Brain MRI 04/29/2015 FINDINGS: CT HEAD FINDINGS Skull and Sinuses:Facial findings described below. Right frontal scalp and forehead hematoma without calvarial fracture. Brain: No evidence of acute infarction, hemorrhage, hydrocephalus, or mass lesion/mass effect. Atrophy with ventriculomegaly, stable from 2016. Mild to moderate chronic microvascular disease with ischemic gliosis confluent around the frontal horns of the lateral ventricles. CT MAXILLOFACIAL FINDINGS The inferior mandibular symphysis is not visualized, but no signs of injury at this level. Right forehead hematoma without facial fracture or mandibular dislocation. No hemo sinus. Bilateral cataract resection. No evidence of globe or other postseptal injury. Right lower second premolar has a large periapical erosion but has undergone root canal. IMPRESSION: 1. No evidence of intracranial injury. 2. Right forehead hematoma without underlying fracture. Electronically Signed   By: Monte Fantasia M.D.   On: 05/04/2016 22:56      Subjective: No dizziness, chest pain or sob.   Discharge Exam: Filed Vitals:   05/15/16 1208 05/15/16 1351  BP: 120/55 137/57  Pulse: 83 91  Temp: 97.7 F (36.5 C) 97.8 F (36.6 C)  Resp: 20 20   Filed Vitals:   05/15/16 0622 05/15/16 0931 05/15/16 1208 05/15/16 1351  BP: 137/55 136/59 120/55 137/57   Pulse: 84 95 83 91  Temp:  97.7 F (36.5 C) 97.7 F (36.5 C) 97.8 F (36.6 C)  TempSrc:  Oral  Oral  Resp:  20 20 20   Height:      Weight:      SpO2:  97% 97% 94%    General: Pt is alert, awake, not in acute distress Cardiovascular: RRR, S1/S2 +, no rubs, no gallops Respiratory: CTA bilaterally, no wheezing, no rhonchi Abdominal: Soft, NT, ND, bowel sounds + Extremities: no edema, no cyanosis    The results of significant diagnostics from this hospitalization (including imaging, microbiology, ancillary and laboratory) are listed below for reference.     Microbiology: Recent Results (from the past 240 hour(s))  Culture, Urine     Status: None   Collection Time: 05/06/16  4:06 PM  Result Value Ref Range Status   Specimen Description URINE, RANDOM  Final   Special Requests Normal  Final   Culture NO  GROWTH  Final   Report Status 05/07/2016 FINAL  Final  MRSA PCR Screening     Status: None   Collection Time: 05/07/16  6:32 AM  Result Value Ref Range Status   MRSA by PCR NEGATIVE NEGATIVE Final    Comment:        The GeneXpert MRSA Assay (FDA approved for NASAL specimens only), is one component of a comprehensive MRSA colonization surveillance program. It is not intended to diagnose MRSA infection nor to guide or monitor treatment for MRSA infections.      Labs: BNP (last 3 results) No results for input(s): BNP in the last 8760 hours. Basic Metabolic Panel:  Recent Labs Lab 05/09/16 0543  05/10/16 0500 05/10/16 2129 05/11/16 0545 05/12/16 0600 05/13/16 0514 05/14/16 0559  NA 126*  < > 123* 128* 130* 132* 130* 132*  K 3.0*  < > 3.9 4.0 4.3 4.4 4.5 4.5  CL 83*  < > 87* 89* 93* 94* 95* 96*  CO2 32  < > 29 29 29 29 27 26   GLUCOSE 134*  < > 127* 141* 121* 103* 108* 98  BUN 9  < > 13 12 12 14 15 20   CREATININE 0.83  < > 0.82 0.95 0.79 0.83 0.83 0.81  CALCIUM 8.4*  < > 8.5* 9.0 8.7* 9.1 9.3 9.4  PHOS 2.8  --  3.4  --   --   --   --  4.6  < > = values in  this interval not displayed. Liver Function Tests:  Recent Labs Lab 05/09/16 0543 05/10/16 0500 05/14/16 0559  ALBUMIN 3.0* 2.8* 3.1*   No results for input(s): LIPASE, AMYLASE in the last 168 hours. No results for input(s): AMMONIA in the last 168 hours. CBC:  Recent Labs Lab 05/09/16 0543  WBC 10.9*  HGB 10.6*  HCT 31.3*  MCV 88.7  PLT 267   Cardiac Enzymes: No results for input(s): CKTOTAL, CKMB, CKMBINDEX, TROPONINI in the last 168 hours. BNP: Invalid input(s): POCBNP CBG: No results for input(s): GLUCAP in the last 168 hours. D-Dimer No results for input(s): DDIMER in the last 72 hours. Hgb A1c No results for input(s): HGBA1C in the last 72 hours. Lipid Profile No results for input(s): CHOL, HDL, LDLCALC, TRIG, CHOLHDL, LDLDIRECT in the last 72 hours. Thyroid function studies No results for input(s): TSH, T4TOTAL, T3FREE, THYROIDAB in the last 72 hours.  Invalid input(s): FREET3 Anemia work up No results for input(s): VITAMINB12, FOLATE, FERRITIN, TIBC, IRON, RETICCTPCT in the last 72 hours. Urinalysis    Component Value Date/Time   COLORURINE YELLOW 05/14/2016 1708   APPEARANCEUR HAZY* 05/14/2016 1708   LABSPEC 1.015 05/14/2016 1708   PHURINE 7.0 05/14/2016 1708   GLUCOSEU NEGATIVE 05/14/2016 1708   HGBUR SMALL* 05/14/2016 1708   BILIRUBINUR NEGATIVE 05/14/2016 1708   KETONESUR NEGATIVE 05/14/2016 1708   PROTEINUR NEGATIVE 05/14/2016 1708   UROBILINOGEN 0.2 08/05/2013 1850   NITRITE NEGATIVE 05/14/2016 1708   LEUKOCYTESUR NEGATIVE 05/14/2016 1708   Sepsis Labs Invalid input(s): PROCALCITONIN,  WBC,  LACTICIDVEN Microbiology Recent Results (from the past 240 hour(s))  Culture, Urine     Status: None   Collection Time: 05/06/16  4:06 PM  Result Value Ref Range Status   Specimen Description URINE, RANDOM  Final   Special Requests Normal  Final   Culture NO GROWTH  Final   Report Status 05/07/2016 FINAL  Final  MRSA PCR Screening     Status: None    Collection Time: 05/07/16  6:32 AM  Result Value Ref Range Status   MRSA by PCR NEGATIVE NEGATIVE Final    Comment:        The GeneXpert MRSA Assay (FDA approved for NASAL specimens only), is one component of a comprehensive MRSA colonization surveillance program. It is not intended to diagnose MRSA infection nor to guide or monitor treatment for MRSA infections.      Time coordinating discharge: Over 30 minutes  SIGNED:   Hosie Poisson, MD  [facility] 05/15/2016, 4:00 PM Pager [phone]  If 7PM-7AM, please contact night-coverage [email or web address] Password [email or web address]

## 2016-05-16 ENCOUNTER — Encounter: Payer: Self-pay | Admitting: Internal Medicine

## 2016-05-16 ENCOUNTER — Non-Acute Institutional Stay (SKILLED_NURSING_FACILITY): Payer: Medicare Other | Admitting: Internal Medicine

## 2016-05-16 DIAGNOSIS — R5381 Other malaise: Secondary | ICD-10-CM | POA: Diagnosis not present

## 2016-05-16 DIAGNOSIS — D72829 Elevated white blood cell count, unspecified: Secondary | ICD-10-CM | POA: Diagnosis not present

## 2016-05-16 DIAGNOSIS — I10 Essential (primary) hypertension: Secondary | ICD-10-CM | POA: Diagnosis not present

## 2016-05-16 DIAGNOSIS — Z9181 History of falling: Secondary | ICD-10-CM

## 2016-05-16 DIAGNOSIS — E46 Unspecified protein-calorie malnutrition: Secondary | ICD-10-CM

## 2016-05-16 DIAGNOSIS — I5032 Chronic diastolic (congestive) heart failure: Secondary | ICD-10-CM | POA: Diagnosis not present

## 2016-05-16 DIAGNOSIS — D649 Anemia, unspecified: Secondary | ICD-10-CM

## 2016-05-16 DIAGNOSIS — E871 Hypo-osmolality and hyponatremia: Secondary | ICD-10-CM | POA: Diagnosis not present

## 2016-05-16 DIAGNOSIS — R339 Retention of urine, unspecified: Secondary | ICD-10-CM

## 2016-05-16 DIAGNOSIS — R4189 Other symptoms and signs involving cognitive functions and awareness: Secondary | ICD-10-CM

## 2016-05-16 NOTE — Progress Notes (Signed)
LOCATION: [place]  PCP: Rachell Cipro, MD   Code Status: Full Code  Goals of care: Advanced Directive information Advanced Directives 05/04/2016  Does patient have an advance directive? Yes  Type of Museum/gallery curator Information Primary Emergency Contact: Navarro,Roy Address: [address]          [place], [place] [address] [place] Phone: [phone] Relation: Spouse   Allergies  Allergen Reactions  . Ace Inhibitors Other (See Comments)    Hyponatremia  . Other     hmg coa redustase inhibitors - muscle aches  . Statins Other (See Comments)    Muscle aches     Chief Complaint  Patient presents with  . New Admit To SNF    New Admission     HPI:  Patient is a 82 y.o. female seen today for short term rehabilitation post hospital admission from 05/04/16-05/15/16 with increased weakness and fall. She was noted to have pulmonary edema and was diuresed. She then had worsening hyponatremia and required iv hypertonic saline and demeclocycline. She was also treated for UTI and has completed her antibiotics. She had urinary retention and had foley catheter placed. She failed the voiding trial and now has a catheter. She had hypotension and her labetalol and lisinopril were held. She is seen in her room today. She has PMH of breast cancer, SIADH, HTN, CAD, HLD among others. She denies any concern.   Review of Systems:  Constitutional: Negative for fever, chills, diaphoresis. Feels weak and tired.  HENT: Negative for headache, congestion, nasal discharge, sore throat, difficulty swallowing.   Eyes: Negative for blurred vision, double vision and discharge.  Respiratory: Negative for cough, shortness of breath and wheezing.   Cardiovascular: Negative for chest pain, palpitations, leg swelling.  Gastrointestinal: Negative for heartburn, nausea, vomiting, abdominal pain, loss of appetite. Doesn't  remember her last bowel movement Genitourinary: Negative for dysuria and flank pain.  Musculoskeletal: Negative for back pain, fall in the facility.  Skin: Negative for itching, rash.  Neurological: Negative for dizziness. Psychiatric/Behavioral: Negative for depression   Past Medical History  Diagnosis Date  . Hypertension   . Hypercholesteremia   . Breast cancer ([place])   . NSTEMI (non-ST elevated myocardial infarction) ([place])     in setting of low Na:  Echo 7/22:  EF 60-65%, normal wall motion, mild LVH, grade 1 diast dysfxn, PASP 29.;  Myoview:  EF 85% and no ischemia  . SIADH (syndrome of inappropriate ADH production) ([place])   . Arthritis   . Personal history of  adenomatouscolonic polyps 04/22/2012    2 diminutive adenomas 12/2006 [name])  . Hx of echocardiogram     Echo (9/14):  Mild focal basal septal hypertrophy, EF 55-60%, normal wall motion, Gr 1 DD    Past Surgical History  Procedure Laterality Date  . Breast lumpectomy    . Cataract extraction w/ intraocular lens  implant, bilateral  2011  . Colonoscopy     Social History:   reports that she has quit smoking. Her smoking use included Cigarettes. She has never used smokeless tobacco. She reports that she drinks alcohol. She reports that she does not use illicit drugs.  Family History  Problem Relation Age of Onset  . Diabetes Father   . Hypertension Mother   . Stroke Father     Medications:   Medication List       This list is accurate as of: 05/16/16  12:05 PM.  Always use your most recent med list.               albuterol 108 (90 Base) MCG/ACT inhaler  Commonly known as:  PROVENTIL HFA;VENTOLIN HFA  Inhale 2 puffs into the lungs every 6 (six) hours as needed for wheezing or shortness of breath.     aspirin 81 MG tablet  Take 81 mg by mouth daily.     CALTRATE 600+D 600-400 MG-UNIT chew tablet  Generic drug:  Calcium Carbonate-Vitamin D  Chew 1 tablet by mouth 2 (two) times daily.     donepezil 10 MG  tablet  Commonly known as:  ARICEPT  Take 10 mg by mouth at bedtime.     fish oil-omega-3 fatty acids 1000 MG capsule  Take 1 g by mouth daily.     fluconazole 150 MG tablet  Commonly known as:  DIFLUCAN  Take 1 tablet (150 mg total) by mouth once a week.     multivitamin tablet  Take 1 tablet by mouth daily.        Immunizations: Immunization History  Administered Date(s) Administered  . PPD Test 05/15/2016     Physical Exam: Filed Vitals:   05/16/16 1201  BP: 135/67  Pulse: 90  Temp: 98 F (36.7 C)  TempSrc: Oral  Resp: 16  Height: 5\' 3"  (1.6 m)  Weight: 146 lb (66.225 kg)  SpO2: 96%   Body mass index is 25.87 kg/(m^2).  General- elderly female, well built, in no acute distress Head- normocephalic, has hematoma to right forehead area Nose- no maxillary or frontal sinus tenderness, no nasal discharge Throat- moist mucus membrane  Eyes- PERRLA, EOMI, no pallor, no icterus, no discharge, normal conjunctiva, normal sclera Neck- no cervical lymphadenopathy Cardiovascular- normal s1,s2, no murmur, no leg edema Respiratory- bilateral clear to auscultation, no wheeze, no rhonchi, no crackles, no use of accessory muscles Abdomen- bowel sounds present, soft, non tender, foley catheter present Musculoskeletal- able to move all 4 extremities, generalized weakness, arthritis changes to her fingers Neurological- alert and oriented to person, place and time Skin- warm and dry, bruise to her face and neck area Psychiatry- normal mood and affect    Labs reviewed: Basic Metabolic Panel:  Recent Labs  05/09/16 0543  05/10/16 0500  05/12/16 0600 05/13/16 0514 05/14/16 0559  NA 126*  < > 123*  < > 132* 130* 132*  K 3.0*  < > 3.9  < > 4.4 4.5 4.5  CL 83*  < > 87*  < > 94* 95* 96*  CO2 32  < > 29  < > 29 27 26   GLUCOSE 134*  < > 127*  < > 103* 108* 98  BUN 9  < > 13  < > 14 15 20   CREATININE 0.83  < > 0.82  < > 0.83 0.83 0.81  CALCIUM 8.4*  < > 8.5*  < > 9.1 9.3 9.4   PHOS 2.8  --  3.4  --   --   --  4.6  < > = values in this interval not displayed. Liver Function Tests:  Recent Labs  04/20/16 1535  05/09/16 0543 05/10/16 0500 05/14/16 0559  AST 27  --   --   --   --   ALT 20  --   --   --   --   ALKPHOS 73  --   --   --   --   BILITOT 0.7  --   --   --   --  PROT 7.5  --   --   --   --   ALBUMIN 4.1  < > 3.0* 2.8* 3.1*  < > = values in this interval not displayed. No results for input(s): LIPASE, AMYLASE in the last 8760 hours. No results for input(s): AMMONIA in the last 8760 hours. CBC:  Recent Labs  04/20/16 1535 05/04/16 2025 05/05/16 0520 05/09/16 0543  WBC 10.8* 14.1* 12.5* 10.9*  NEUTROABS 7.9* 12.8*  --   --   HGB 15.0 12.9 11.9* 10.6*  HCT 43.4 37.6 35.7* 31.3*  MCV 90.2 92.2 90.2 88.7  PLT 313 254 238 267    Radiological Exams: Dg Chest 2 View  05/04/2016  CLINICAL DATA:  Acute onset of cough.  Initial encounter. EXAM: CHEST  2 VIEW COMPARISON:  Chest radiograph performed 04/20/2016 FINDINGS: The lungs are well-aerated. Mild vascular congestion is noted. Mild bibasilar atelectasis is seen. There is no evidence of pleural effusion or pneumothorax. The heart is normal in size; the mediastinal contour is within normal limits. No acute osseous abnormalities are seen. Clips are seen at the left axilla. IMPRESSION: Mild vascular congestion noted.  Mild bibasilar atelectasis seen. Electronically Signed   By: Garald Balding M.D.   On: 05/04/2016 22:49   Dg Chest 2 View  04/20/2016  CLINICAL DATA:  Cough for over a week, sore throat, hypertension, breast cancer post lumpectomy, former smoker EXAM: CHEST  2 VIEW COMPARISON:  08/05/2013 FINDINGS: Normal heart size, mediastinal contours, and pulmonary vascularity. Lungs hyperinflated but clear. No infiltrate, pleural effusion or pneumothorax. Bones demineralized Post LEFT breast surgery and question axillary node dissection. IMPRESSION: Emphysematous changes without acute infiltrate.  Electronically Signed   By: Lavonia Dana M.D.   On: 04/20/2016 14:21   Ct Head Wo Contrast  05/04/2016  CLINICAL DATA:  Fall with head injury. Right-sided facial bruising. Initial encounter. EXAM: CT HEAD WITHOUT CONTRAST CT MAXILLOFACIAL WITHOUT CONTRAST TECHNIQUE: Multidetector CT imaging of the head and maxillofacial structures were performed using the standard protocol without intravenous contrast. Multiplanar CT image reconstructions of the maxillofacial structures were also generated. COMPARISON:  Brain MRI 04/29/2015 FINDINGS: CT HEAD FINDINGS Skull and Sinuses:Facial findings described below. Right frontal scalp and forehead hematoma without calvarial fracture. Brain: No evidence of acute infarction, hemorrhage, hydrocephalus, or mass lesion/mass effect. Atrophy with ventriculomegaly, stable from 2016. Mild to moderate chronic microvascular disease with ischemic gliosis confluent around the frontal horns of the lateral ventricles. CT MAXILLOFACIAL FINDINGS The inferior mandibular symphysis is not visualized, but no signs of injury at this level. Right forehead hematoma without facial fracture or mandibular dislocation. No hemo sinus. Bilateral cataract resection. No evidence of globe or other postseptal injury. Right lower second premolar has a large periapical erosion but has undergone root canal. IMPRESSION: 1. No evidence of intracranial injury. 2. Right forehead hematoma without underlying fracture. Electronically Signed   By: Monte Fantasia M.D.   On: 05/04/2016 22:56   Dg Chest Port 1 View  05/06/2016  CLINICAL DATA:  Encounter for central line placement EXAM: PORTABLE CHEST 1 VIEW COMPARISON:  05/04/2016 FINDINGS: Left IJ central line with tip at the SVC. Normal heart size and stable mediastinal contours when allowing for differences in technique. Low volume chest with interstitial crowding. Stable biapical pleural thickening. Postoperative changes left breast and axilla. IMPRESSION: 1. New left  IJ catheter without adverse finding. 2. Low volume chest. Electronically Signed   By: Monte Fantasia M.D.   On: 05/06/2016 22:50   Ct Maxillofacial Wo  Cm  05/04/2016  CLINICAL DATA:  Fall with head injury. Right-sided facial bruising. Initial encounter. EXAM: CT HEAD WITHOUT CONTRAST CT MAXILLOFACIAL WITHOUT CONTRAST TECHNIQUE: Multidetector CT imaging of the head and maxillofacial structures were performed using the standard protocol without intravenous contrast. Multiplanar CT image reconstructions of the maxillofacial structures were also generated. COMPARISON:  Brain MRI 04/29/2015 FINDINGS: CT HEAD FINDINGS Skull and Sinuses:Facial findings described below. Right frontal scalp and forehead hematoma without calvarial fracture. Brain: No evidence of acute infarction, hemorrhage, hydrocephalus, or mass lesion/mass effect. Atrophy with ventriculomegaly, stable from 2016. Mild to moderate chronic microvascular disease with ischemic gliosis confluent around the frontal horns of the lateral ventricles. CT MAXILLOFACIAL FINDINGS The inferior mandibular symphysis is not visualized, but no signs of injury at this level. Right forehead hematoma without facial fracture or mandibular dislocation. No hemo sinus. Bilateral cataract resection. No evidence of globe or other postseptal injury. Right lower second premolar has a large periapical erosion but has undergone root canal. IMPRESSION: 1. No evidence of intracranial injury. 2. Right forehead hematoma without underlying fracture. Electronically Signed   By: Monte Fantasia M.D.   On: 05/04/2016 22:56    Assessment/Plan  Physical deconditioning With generalized weakness. Will have her work with physical therapy and occupational therapy team to help with gait training and muscle strengthening exercises.fall precautions. Skin care. Encourage to be out of bed.   Hyponatremia S/p iv hypertonic saline and demeclocycline in the hospital. Monitor po intake and  bmp  High fall risk Will need fall precautions. Continue calcium and vitamin d supplement  Cognitive impairment Continue aricept and supportive care, fall precautions, pressure ulcer prophylaxis  Protein calorie malnutrition Get dietary consult, continue MVI, monitor weight  Urinary retention Has foley catheter in place with good urine output. Has failed voiding trial in hospital. Get urology consult  Chronic diastolic CHF With recent diuresis in hospital. At present, off lasix, lisinopril and labetalol with hyponatremia and hypotension. Monitor daily weight and breathing.   HTN Off antihypertensives at present. Monitor BP bid and introduce medication if needed. Check bmp  Leukocytosis Afebrile, monitor cbc and temp curve  Anemia Unspecified, monitor cbc    Goals of care: short term rehabilitation   Labs/tests ordered: cbc, cmp 05/17/16  Family/ staff Communication: reviewed care plan with patient and nursing supervisor    Blanchie Serve, MD Internal Medicine [place], [place] [address] Cell Phone (Monday-Friday 8 am - 5 pm): [phone] On Call: [phone] and follow prompts after 5 pm and on weekends Office Phone: [phone] Office Fax: [phone]

## 2016-05-17 LAB — HEPATIC FUNCTION PANEL
ALT: 24 U/L (ref 7–35)
AST: 23 U/L (ref 13–35)
Alkaline Phosphatase: 110 U/L (ref 25–125)
Bilirubin, Total: 0.5 mg/dL

## 2016-05-17 LAB — CBC AND DIFFERENTIAL
HEMATOCRIT: 38 % (ref 36–46)
HEMOGLOBIN: 12.4 g/dL (ref 12.0–16.0)
NEUTROS ABS: 12 /uL
Platelets: 414 10*3/uL — AB (ref 150–399)
WBC: 15 10*3/mL

## 2016-05-17 LAB — BASIC METABOLIC PANEL
BUN: 20 mg/dL (ref 4–21)
Creatinine: 0.9 mg/dL (ref 0.5–1.1)
GLUCOSE: 114 mg/dL
POTASSIUM: 4.4 mmol/L (ref 3.4–5.3)
SODIUM: 135 mmol/L — AB (ref 137–147)

## 2016-05-23 DIAGNOSIS — R338 Other retention of urine: Secondary | ICD-10-CM | POA: Diagnosis not present

## 2016-06-01 ENCOUNTER — Non-Acute Institutional Stay (SKILLED_NURSING_FACILITY): Payer: Medicare Other | Admitting: Adult Health

## 2016-06-01 ENCOUNTER — Encounter: Payer: Self-pay | Admitting: Adult Health

## 2016-06-01 DIAGNOSIS — I10 Essential (primary) hypertension: Secondary | ICD-10-CM

## 2016-06-01 DIAGNOSIS — D72829 Elevated white blood cell count, unspecified: Secondary | ICD-10-CM

## 2016-06-01 NOTE — Progress Notes (Signed)
Patient ID: Carla Robles, female   DOB: May 23, 1934, 82 y.o.   MRN: [id number]    DATE:  06/01/2016   MRN:  [id number]  BIRTHDAY: [birth year]  Facility:  [facility] Home Location:  [facility] Room Number: [id number]  LEVEL OF CARE:  SNF [phone])  Contact Information    Name Relation Home Work Mobile   Messman,Roy Spouse [phone]  [phone]   Rynlee, Cosman [phone]  [phone]       Code Status History    Date Active Date Inactive Code Status Order ID Comments User Context   05/05/2016  2:08 AM 05/15/2016  9:00 PM Full Code [id number]  Theressa Millard, MD Inpatient       Chief Complaint  Patient presents with  . Acute Visit    Hypertension    HISTORY OF PRESENT ILLNESS:  This is a 82 year old female who has been noted to have BP 184/98 . Husband reported that patient used to take medication (Lisinopril) for hypertension before but was discontinued due to hypotension. No complaints of headache nor dizziness. ACE inhibitors listed as one of her allergies. Noted wbc 15.0 (taken 05/17/16). No fever nor cough.  She has been admitted to [facility]16 from [facility] with increased weakness and fall. She was noted to have pulmonary edema and was diuresed. She then had worsening hyponatremia and required IV hypertonic saline and demeclocycline. She was also treated for UTI and has completed antibiotics. She had urinary retention and had Foley catheter placed. She failed a voiding trial and still has the Foley catheter.  She is currently having short-term rehabilitation.  PAST MEDICAL HISTORY:  Past Medical History  Diagnosis Date  . Hypertension   . Hypercholesteremia   . Breast cancer ([place])   . NSTEMI (non-ST elevated myocardial infarction) ([place])     in setting of low Na:  Echo 7/22:  EF 60-65%, normal wall motion, mild LVH, grade 1 diast dysfxn, PASP 29.;  Myoview:  EF 85% and no ischemia  . SIADH (syndrome of inappropriate ADH  production) ([place])   . Arthritis   . Personal history of  adenomatouscolonic polyps 04/22/2012    2 diminutive adenomas 12/2006 [name])  . Hx of echocardiogram     Echo (9/14):  Mild focal basal septal hypertrophy, EF 55-60%, normal wall motion, Gr 1 DD      CURRENT MEDICATIONS: Reviewed  Patient's Medications  New Prescriptions   No medications on file  Previous Medications   ALBUTEROL (PROVENTIL HFA;VENTOLIN HFA) 108 (90 BASE) MCG/ACT INHALER    Inhale 2 puffs into the lungs every 6 (six) hours as needed for wheezing or shortness of breath.   ASPIRIN 81 MG TABLET    Take 81 mg by mouth daily.     CALCIUM CARBONATE-VITAMIN D (CALTRATE 600+D) 600-400 MG-UNIT PER CHEW TABLET    Chew 1 tablet by mouth 2 (two) times daily.    DONEPEZIL (ARICEPT) 10 MG TABLET    Take 10 mg by mouth at bedtime.   FISH OIL-OMEGA-3 FATTY ACIDS 1000 MG CAPSULE    Take 1 g by mouth daily.     FLUCONAZOLE (DIFLUCAN) 150 MG TABLET    Take 1 tablet (150 mg total) by mouth once a week.   MULTIPLE VITAMIN (MULTIVITAMIN) TABLET    Take 1 tablet by mouth daily.    Modified Medications   No medications on file  Discontinued Medications   No medications on file  Allergies  Allergen Reactions  . Ace Inhibitors Other (See Comments)    Hyponatremia  . Other     hmg coa redustase inhibitors - muscle aches  . Statins Other (See Comments)    Muscle aches      REVIEW OF SYSTEMS:  GENERAL: no change in appetite, no fatigue, no weight changes, no fever, chills or weakness EYES: Denies change in vision, dry eyes, eye pain, itching or discharge EARS: Denies change in hearing, ringing in ears, or earache NOSE: Denies nasal congestion or epistaxis MOUTH and THROAT: Denies oral discomfort, gingival pain or bleeding, pain from teeth or hoarseness   RESPIRATORY: no cough, SOB, DOE, wheezing, hemoptysis CARDIAC: no chest pain, edema or palpitations GI: no abdominal pain, diarrhea, constipation, heart burn, nausea or  vomiting GU: Denies dysuria, frequency, hematuria, incontinence, or discharge PSYCHIATRIC: Denies feeling of depression or anxiety. No report of hallucinations, insomnia, paranoia, or agitation    PHYSICAL EXAMINATION  GENERAL APPEARANCE: Well nourished. In no acute distress. Normal body habitus SKIN:  Skin is warm and dry.  HEAD: Normal in size and contour. No evidence of trauma EYES: Lids open and close normally. No blepharitis, entropion or ectropion. PERRL. Conjunctivae are clear and sclerae are white. Lenses are without opacity EARS: Pinnae are normal. Patient hears normal voice tunes of the examiner MOUTH and THROAT: Lips are without lesions. Oral mucosa is moist and without lesions. Tongue is normal in shape, size, and color and without lesions NECK: supple, trachea midline, no neck masses, no thyroid tenderness, no thyromegaly LYMPHATICS: no LAN in the neck, no supraclavicular LAN RESPIRATORY: breathing is even & unlabored, BS CTAB CARDIAC: RRR, no murmur,no extra heart sounds, no edema GI: abdomen soft, normal BS, no masses, no tenderness, no hepatomegaly, no splenomegaly GU:  Has foley catheter draining to urine bag with clear yellowish urine EXTREMITIES:  Able to move X 4 extremities PSYCHIATRIC: Alert and oriented X 3. Affect and behavior are appropriate  LABS/RADIOLOGY: Labs reviewed: 05/18/16   chest x-ray shows no acute cardiac or pulmonary pathology, mild osteoarthritis 05/17/16   WBC 15.0 hemoglobin 12.4 hematocrit 37.5 MCV 95.2 platelet 414 sodium 135 potassium 4.4 glucose 114 BUN 20 creatinine 0.92 calcium 9.3 total protein 5.6 albumin 3.53 total bilirubin 0.54 alkaline phosphatase 110 SGOT 23 SGPT 24  GFR [id number] Basic Metabolic Panel:  Recent Labs  05/09/16 0543  05/10/16 0500  05/12/16 0600 05/13/16 0514 05/14/16 0559  NA 126*  < > 123*  < > 132* 130* 132*  K 3.0*  < > 3.9  < > 4.4 4.5 4.5  CL 83*  < > 87*  < > 94* 95* 96*  CO2 32  < > 29  < > 29 27 26     GLUCOSE 134*  < > 127*  < > 103* 108* 98  BUN 9  < > 13  < > 14 15 20   CREATININE 0.83  < > 0.82  < > 0.83 0.83 0.81  CALCIUM 8.4*  < > 8.5*  < > 9.1 9.3 9.4  PHOS 2.8  --  3.4  --   --   --  4.6  < > = values in this interval not displayed. Liver Function Tests:  Recent Labs  04/20/16 1535  05/09/16 0543 05/10/16 0500 05/14/16 0559  AST 27  --   --   --   --   ALT 20  --   --   --   --   ALKPHOS 73  --   --   --   --  BILITOT 0.7  --   --   --   --   PROT 7.5  --   --   --   --   ALBUMIN 4.1  < > 3.0* 2.8* 3.1*  < > = values in this interval not displayed. No results for input(s): LIPASE, AMYLASE in the last 8760 hours. No results for input(s): AMMONIA in the last 8760 hours. CBC:  Recent Labs  04/20/16 1535 05/04/16 2025 05/05/16 0520 05/09/16 0543  WBC 10.8* 14.1* 12.5* 10.9*  NEUTROABS 7.9* 12.8*  --   --   HGB 15.0 12.9 11.9* 10.6*  HCT 43.4 37.6 35.7* 31.3*  MCV 90.2 92.2 90.2 88.7  PLT 313 254 238 267    Dg Chest 2 View  05/04/2016  CLINICAL DATA:  Acute onset of cough.  Initial encounter. EXAM: CHEST  2 VIEW COMPARISON:  Chest radiograph performed 04/20/2016 FINDINGS: The lungs are well-aerated. Mild vascular congestion is noted. Mild bibasilar atelectasis is seen. There is no evidence of pleural effusion or pneumothorax. The heart is normal in size; the mediastinal contour is within normal limits. No acute osseous abnormalities are seen. Clips are seen at the left axilla. IMPRESSION: Mild vascular congestion noted.  Mild bibasilar atelectasis seen. Electronically Signed   By: Garald Balding M.D.   On: 05/04/2016 22:49   Ct Head Wo Contrast  05/04/2016  CLINICAL DATA:  Fall with head injury. Right-sided facial bruising. Initial encounter. EXAM: CT HEAD WITHOUT CONTRAST CT MAXILLOFACIAL WITHOUT CONTRAST TECHNIQUE: Multidetector CT imaging of the head and maxillofacial structures were performed using the standard protocol without intravenous contrast. Multiplanar CT  image reconstructions of the maxillofacial structures were also generated. COMPARISON:  Brain MRI 04/29/2015 FINDINGS: CT HEAD FINDINGS Skull and Sinuses:Facial findings described below. Right frontal scalp and forehead hematoma without calvarial fracture. Brain: No evidence of acute infarction, hemorrhage, hydrocephalus, or mass lesion/mass effect. Atrophy with ventriculomegaly, stable from 2016. Mild to moderate chronic microvascular disease with ischemic gliosis confluent around the frontal horns of the lateral ventricles. CT MAXILLOFACIAL FINDINGS The inferior mandibular symphysis is not visualized, but no signs of injury at this level. Right forehead hematoma without facial fracture or mandibular dislocation. No hemo sinus. Bilateral cataract resection. No evidence of globe or other postseptal injury. Right lower second premolar has a large periapical erosion but has undergone root canal. IMPRESSION: 1. No evidence of intracranial injury. 2. Right forehead hematoma without underlying fracture. Electronically Signed   By: Monte Fantasia M.D.   On: 05/04/2016 22:56   Dg Chest Port 1 View  05/06/2016  CLINICAL DATA:  Encounter for central line placement EXAM: PORTABLE CHEST 1 VIEW COMPARISON:  05/04/2016 FINDINGS: Left IJ central line with tip at the SVC. Normal heart size and stable mediastinal contours when allowing for differences in technique. Low volume chest with interstitial crowding. Stable biapical pleural thickening. Postoperative changes left breast and axilla. IMPRESSION: 1. New left IJ catheter without adverse finding. 2. Low volume chest. Electronically Signed   By: Monte Fantasia M.D.   On: 05/06/2016 22:50   Ct Maxillofacial Wo Cm  05/04/2016  CLINICAL DATA:  Fall with head injury. Right-sided facial bruising. Initial encounter. EXAM: CT HEAD WITHOUT CONTRAST CT MAXILLOFACIAL WITHOUT CONTRAST TECHNIQUE: Multidetector CT imaging of the head and maxillofacial structures were performed using the  standard protocol without intravenous contrast. Multiplanar CT image reconstructions of the maxillofacial structures were also generated. COMPARISON:  Brain MRI 04/29/2015 FINDINGS: CT HEAD FINDINGS Skull and Sinuses:Facial findings described below. Right  frontal scalp and forehead hematoma without calvarial fracture. Brain: No evidence of acute infarction, hemorrhage, hydrocephalus, or mass lesion/mass effect. Atrophy with ventriculomegaly, stable from 2016. Mild to moderate chronic microvascular disease with ischemic gliosis confluent around the frontal horns of the lateral ventricles. CT MAXILLOFACIAL FINDINGS The inferior mandibular symphysis is not visualized, but no signs of injury at this level. Right forehead hematoma without facial fracture or mandibular dislocation. No hemo sinus. Bilateral cataract resection. No evidence of globe or other postseptal injury. Right lower second premolar has a large periapical erosion but has undergone root canal. IMPRESSION: 1. No evidence of intracranial injury. 2. Right forehead hematoma without underlying fracture. Electronically Signed   By: Monte Fantasia M.D.   On: 05/04/2016 22:56    ASSESSMENT/PLAN:  Hypertension - give clonidine 0.1 mg 1 tab by mouth 1 now then start Toprol-XL 25 mg 1/2 tab = 2.5 mg by mouth daily (start 06/02/16) ; BP/HR twice a day 1 week; BMP next lab draw  Leukocytosis - wbc 15.0; check Millard, NP [facility] [phone]

## 2016-06-07 ENCOUNTER — Encounter: Payer: Self-pay | Admitting: Adult Health

## 2016-06-07 ENCOUNTER — Non-Acute Institutional Stay (SKILLED_NURSING_FACILITY): Payer: Medicare Other | Admitting: Adult Health

## 2016-06-07 DIAGNOSIS — I10 Essential (primary) hypertension: Secondary | ICD-10-CM

## 2016-06-07 DIAGNOSIS — R5381 Other malaise: Secondary | ICD-10-CM | POA: Diagnosis not present

## 2016-06-07 DIAGNOSIS — I5032 Chronic diastolic (congestive) heart failure: Secondary | ICD-10-CM

## 2016-06-07 DIAGNOSIS — R4189 Other symptoms and signs involving cognitive functions and awareness: Secondary | ICD-10-CM | POA: Diagnosis not present

## 2016-06-07 DIAGNOSIS — R338 Other retention of urine: Secondary | ICD-10-CM | POA: Diagnosis not present

## 2016-06-07 NOTE — Progress Notes (Signed)
Patient ID: Carla Robles, female   DOB: Jun 26, 1934, 82 y.o.   MRN: [id number]    DATE:  06/07/2016   MRN:  [id number]  BIRTHDAY: 01/21/1934  Facility:  Nursing Home Location:  [place] Room Number: [id number]  LEVEL OF CARE:  SNF [phone])  Contact Information    Name Relation Home Work Mobile   Ballantine,Roy Spouse [phone]  [phone]   Kellyn, Kutsch [phone]  [phone]       Code Status History    Date Active Date Inactive Code Status Order ID Comments User Context   05/05/2016  2:08 AM 05/15/2016  9:00 PM Full Code [id number]  Theressa Millard, MD Inpatient       Chief Complaint  Patient presents with  . Discharge Note    HISTORY OF PRESENT ILLNESS:  This is a 82 year old female who Is for discharge home with home health OT, PT, CNA, SLP for memory recall and nursing.  She has been admitted to [facility] on 05/15/16 from [facility] with increased weakness and fall. She was noted to have pulmonary edema and was diuresed. She then had worsening hyponatremia and required IV hypertonic saline and demeclocycline. She was also treated for UTI and has completed antibiotics. She had urinary retention and had Foley catheter placed.   Her foley catheter has now been discontinued and she is able to urinate without any problem.  Patient was admitted to this facility for short-term rehabilitation after the patient's recent hospitalization.  Patient has completed SNF rehabilitation and therapy has cleared the patient for discharge.  PAST MEDICAL HISTORY:  Past Medical History  Diagnosis Date  . Hypertension   . Hypercholesteremia   . Breast cancer ([place])   . NSTEMI (non-ST elevated myocardial infarction) ([place])     in setting of low Na:  Echo 7/22:  EF 60-65%, normal wall motion, mild LVH, grade 1 diast dysfxn, PASP 29.;  Myoview:  EF 85% and no ischemia  . SIADH (syndrome of inappropriate ADH production) ([place])   . Arthritis   .  Personal history of  adenomatouscolonic polyps 04/22/2012    2 diminutive adenomas 12/2006 [name])  . Hx of echocardiogram     Echo (9/14):  Mild focal basal septal hypertrophy, EF 55-60%, normal wall motion, Gr 1 DD   . Leukocytosis   . Physical deconditioning   . Hyponatremia   . Chronic diastolic CHF (congestive heart failure) ([place])   . Cognitive impairment   . Risk for falls   . Urinary retention      CURRENT MEDICATIONS: Reviewed  Patient's Medications  New Prescriptions   No medications on file  Previous Medications   ALBUTEROL (PROVENTIL HFA;VENTOLIN HFA) 108 (90 BASE) MCG/ACT INHALER    Inhale 2 puffs into the lungs every 6 (six) hours as needed for wheezing or shortness of breath.   ASPIRIN 81 MG TABLET    Take 81 mg by mouth daily.     CALCIUM CARBONATE-VITAMIN D (CALTRATE 600+D) 600-400 MG-UNIT PER CHEW TABLET    Chew 1 tablet by mouth 2 (two) times daily.    DONEPEZIL (ARICEPT) 10 MG TABLET    Take 10 mg by mouth at bedtime.   FISH OIL-OMEGA-3 FATTY ACIDS 1000 MG CAPSULE    Take 1 g by mouth daily.     FLUCONAZOLE (DIFLUCAN) 150 MG TABLET    Take 1 tablet (150 mg total) by mouth once a week.   METOPROLOL SUCCINATE (TOPROL-XL) 25 MG  24 HR TABLET    Take 12.5 mg by mouth daily.   MULTIPLE VITAMIN (MULTIVITAMIN) TABLET    Take 1 tablet by mouth daily.    Modified Medications   No medications on file  Discontinued Medications   No medications on file     Allergies  Allergen Reactions  . Ace Inhibitors Other (See Comments)    Hyponatremia  . Other     hmg coa redustase inhibitors - muscle aches  . Statins Other (See Comments)    Muscle aches      REVIEW OF SYSTEMS:  GENERAL: no change in appetite, no fatigue, no weight changes, no fever, chills or weakness EYES: Denies change in vision, dry eyes, eye pain, itching or discharge EARS: Denies change in hearing, ringing in ears, or earache NOSE: Denies nasal congestion or epistaxis MOUTH and THROAT: Denies oral  discomfort, gingival pain or bleeding, pain from teeth or hoarseness   RESPIRATORY: no cough, SOB, DOE, wheezing, hemoptysis CARDIAC: no chest pain, edema or palpitations GI: no abdominal pain, diarrhea, constipation, heart burn, nausea or vomiting GU: Denies dysuria, frequency, hematuria, incontinence, or discharge PSYCHIATRIC: Denies feeling of depression or anxiety. No report of hallucinations, insomnia, paranoia, or agitation    PHYSICAL EXAMINATION  GENERAL APPEARANCE: Well nourished. In no acute distress. Normal body habitus SKIN:  Skin is warm and dry.  HEAD: Normal in size and contour. No evidence of trauma EYES: Lids open and close normally. No blepharitis, entropion or ectropion. PERRL. Conjunctivae are clear and sclerae are white. Lenses are without opacity EARS: Pinnae are normal. Patient hears normal voice tunes of the examiner MOUTH and THROAT: Lips are without lesions. Oral mucosa is moist and without lesions. Tongue is normal in shape, size, and color and without lesions NECK: supple, trachea midline, no neck masses, no thyroid tenderness, no thyromegaly LYMPHATICS: no LAN in the neck, no supraclavicular LAN RESPIRATORY: breathing is even & unlabored, BS CTAB CARDIAC: RRR, no murmur,no extra heart sounds, no edema GI: abdomen soft, normal BS, no masses, no tenderness, no hepatomegaly, no splenomegaly EXTREMITIES:  Able to move X 4 extremities PSYCHIATRIC: Alert and oriented X 3. Affect and behavior are appropriate  LABS/RADIOLOGY: Labs reviewed:   06/04/16    Wbc 9.8  hgb 13.3  hct 41.0  MCV 96  Platelet 359 05/18/16   chest x-ray shows no acute cardiac or pulmonary pathology, mild osteoarthritis 05/17/16   WBC 15.0 hemoglobin 12.4 hematocrit 37.5 MCV 95.2 platelet 414 sodium 135 potassium 4.4 glucose 114 BUN 20 creatinine 0.92 calcium 9.3 total protein 5.6 albumin 3.53 total bilirubin 0.54 alkaline phosphatase 110 SGOT 23 SGPT 24  GFR [id number] Basic Metabolic Panel:  Recent  Labs  05/09/16 0543  05/10/16 0500  05/12/16 0600 05/13/16 0514 05/14/16 0559 05/17/16  NA 126*  < > 123*  < > 132* 130* 132* 135*  K 3.0*  < > 3.9  < > 4.4 4.5 4.5 4.4  CL 83*  < > 87*  < > 94* 95* 96*  --   CO2 32  < > 29  < > 29 27 26   --   GLUCOSE 134*  < > 127*  < > 103* 108* 98  --   BUN 9  < > 13  < > 14 15 20 20   CREATININE 0.83  < > 0.82  < > 0.83 0.83 0.81 0.9  CALCIUM 8.4*  < > 8.5*  < > 9.1 9.3 9.4  --   PHOS 2.8  --  3.4  --   --   --  4.6  --   < > = values in this interval not displayed. Liver Function Tests:  Recent Labs  04/20/16 1535  05/09/16 0543 05/10/16 0500 05/14/16 0559 05/17/16  AST 27  --   --   --   --  23  ALT 20  --   --   --   --  24  ALKPHOS 73  --   --   --   --  110  BILITOT 0.7  --   --   --   --   --   PROT 7.5  --   --   --   --   --   ALBUMIN 4.1  < > 3.0* 2.8* 3.1*  --   < > = values in this interval not displayed.  CBC:  Recent Labs  04/20/16 1535 05/04/16 2025 05/05/16 0520 05/09/16 0543 05/17/16  WBC 10.8* 14.1* 12.5* 10.9* 15.0  NEUTROABS 7.9* 12.8*  --   --  12  HGB 15.0 12.9 11.9* 10.6* 12.4  HCT 43.4 37.6 35.7* 31.3* 38  MCV 90.2 92.2 90.2 88.7  --   PLT 313 254 238 267 414*     ASSESSMENT/PLAN:  Physical deconditioning - for home health OT, PT, CNA, SLP and Nursing  Hyponatremia - resolved; NA 135  Cognitive impairment - continue Aricept 10 mg 1 tab by mouth daily at bedtime; fall precaution  Urinary retention - resolved  Chronic diastolic CHF - stable; continue fluid restrictions 1200 mL/daily and Toprol XL 25 mg 1/2 tab = 12.5 mg PO daily  Hypertension - continue Toprol-XL 25 mg 1/2 tab=12.5 mg by mouth daily  Leukocytosis - resolved; wbc 9.8      I have filled out patient's discharge paperwork and written prescriptions.  Patient will receive home health PT, OT, ST, Nursing and CNA.  DME provided:  None  Total discharge time: Less than 30 minutes  Discharge time involved coordination of the  discharge process with social worker, nursing staff and therapy department. Medical justification for home health services verified.      Durenda Age, NP [employer] [phone]

## 2016-06-14 DIAGNOSIS — I5032 Chronic diastolic (congestive) heart failure: Secondary | ICD-10-CM | POA: Diagnosis not present

## 2016-06-14 DIAGNOSIS — M1991 Primary osteoarthritis, unspecified site: Secondary | ICD-10-CM | POA: Diagnosis not present

## 2016-06-14 DIAGNOSIS — D649 Anemia, unspecified: Secondary | ICD-10-CM | POA: Diagnosis not present

## 2016-06-14 DIAGNOSIS — Z87440 Personal history of urinary (tract) infections: Secondary | ICD-10-CM | POA: Diagnosis not present

## 2016-06-14 DIAGNOSIS — I951 Orthostatic hypotension: Secondary | ICD-10-CM | POA: Diagnosis not present

## 2016-06-14 DIAGNOSIS — E46 Unspecified protein-calorie malnutrition: Secondary | ICD-10-CM | POA: Diagnosis not present

## 2016-06-14 DIAGNOSIS — R339 Retention of urine, unspecified: Secondary | ICD-10-CM | POA: Diagnosis not present

## 2016-06-14 DIAGNOSIS — I2510 Atherosclerotic heart disease of native coronary artery without angina pectoris: Secondary | ICD-10-CM | POA: Diagnosis not present

## 2016-06-14 DIAGNOSIS — E222 Syndrome of inappropriate secretion of antidiuretic hormone: Secondary | ICD-10-CM | POA: Diagnosis not present

## 2016-06-14 DIAGNOSIS — Z87891 Personal history of nicotine dependence: Secondary | ICD-10-CM | POA: Diagnosis not present

## 2016-06-14 DIAGNOSIS — I110 Hypertensive heart disease with heart failure: Secondary | ICD-10-CM | POA: Diagnosis not present

## 2016-06-14 DIAGNOSIS — Z9181 History of falling: Secondary | ICD-10-CM | POA: Diagnosis not present

## 2016-06-14 DIAGNOSIS — E7800 Pure hypercholesterolemia, unspecified: Secondary | ICD-10-CM | POA: Diagnosis not present

## 2016-06-14 DIAGNOSIS — I252 Old myocardial infarction: Secondary | ICD-10-CM | POA: Diagnosis not present

## 2016-06-14 DIAGNOSIS — Z853 Personal history of malignant neoplasm of breast: Secondary | ICD-10-CM | POA: Diagnosis not present

## 2016-06-14 DIAGNOSIS — G3184 Mild cognitive impairment, so stated: Secondary | ICD-10-CM | POA: Diagnosis not present

## 2016-06-16 DIAGNOSIS — I110 Hypertensive heart disease with heart failure: Secondary | ICD-10-CM | POA: Diagnosis not present

## 2016-06-16 DIAGNOSIS — I5032 Chronic diastolic (congestive) heart failure: Secondary | ICD-10-CM | POA: Diagnosis not present

## 2016-06-16 DIAGNOSIS — D649 Anemia, unspecified: Secondary | ICD-10-CM | POA: Diagnosis not present

## 2016-06-16 DIAGNOSIS — E222 Syndrome of inappropriate secretion of antidiuretic hormone: Secondary | ICD-10-CM | POA: Diagnosis not present

## 2016-06-16 DIAGNOSIS — M1991 Primary osteoarthritis, unspecified site: Secondary | ICD-10-CM | POA: Diagnosis not present

## 2016-06-16 DIAGNOSIS — I2510 Atherosclerotic heart disease of native coronary artery without angina pectoris: Secondary | ICD-10-CM | POA: Diagnosis not present

## 2016-06-20 DIAGNOSIS — Z0000 Encounter for general adult medical examination without abnormal findings: Secondary | ICD-10-CM | POA: Diagnosis not present

## 2016-06-20 DIAGNOSIS — I2510 Atherosclerotic heart disease of native coronary artery without angina pectoris: Secondary | ICD-10-CM | POA: Diagnosis not present

## 2016-06-20 DIAGNOSIS — E871 Hypo-osmolality and hyponatremia: Secondary | ICD-10-CM | POA: Diagnosis not present

## 2016-06-20 DIAGNOSIS — I110 Hypertensive heart disease with heart failure: Secondary | ICD-10-CM | POA: Diagnosis not present

## 2016-06-20 DIAGNOSIS — D649 Anemia, unspecified: Secondary | ICD-10-CM | POA: Diagnosis not present

## 2016-06-20 DIAGNOSIS — I5032 Chronic diastolic (congestive) heart failure: Secondary | ICD-10-CM | POA: Diagnosis not present

## 2016-06-20 DIAGNOSIS — E222 Syndrome of inappropriate secretion of antidiuretic hormone: Secondary | ICD-10-CM | POA: Diagnosis not present

## 2016-06-20 DIAGNOSIS — I10 Essential (primary) hypertension: Secondary | ICD-10-CM | POA: Diagnosis not present

## 2016-06-20 DIAGNOSIS — M1991 Primary osteoarthritis, unspecified site: Secondary | ICD-10-CM | POA: Diagnosis not present

## 2016-06-21 DIAGNOSIS — E559 Vitamin D deficiency, unspecified: Secondary | ICD-10-CM | POA: Diagnosis not present

## 2016-06-21 DIAGNOSIS — I10 Essential (primary) hypertension: Secondary | ICD-10-CM | POA: Diagnosis not present

## 2016-06-21 DIAGNOSIS — E871 Hypo-osmolality and hyponatremia: Secondary | ICD-10-CM | POA: Diagnosis not present

## 2016-06-21 DIAGNOSIS — Z0000 Encounter for general adult medical examination without abnormal findings: Secondary | ICD-10-CM | POA: Diagnosis not present

## 2016-06-21 DIAGNOSIS — N390 Urinary tract infection, site not specified: Secondary | ICD-10-CM | POA: Diagnosis not present

## 2016-06-22 DIAGNOSIS — D649 Anemia, unspecified: Secondary | ICD-10-CM | POA: Diagnosis not present

## 2016-06-22 DIAGNOSIS — M1991 Primary osteoarthritis, unspecified site: Secondary | ICD-10-CM | POA: Diagnosis not present

## 2016-06-22 DIAGNOSIS — E222 Syndrome of inappropriate secretion of antidiuretic hormone: Secondary | ICD-10-CM | POA: Diagnosis not present

## 2016-06-22 DIAGNOSIS — I2510 Atherosclerotic heart disease of native coronary artery without angina pectoris: Secondary | ICD-10-CM | POA: Diagnosis not present

## 2016-06-22 DIAGNOSIS — I5032 Chronic diastolic (congestive) heart failure: Secondary | ICD-10-CM | POA: Diagnosis not present

## 2016-06-22 DIAGNOSIS — I110 Hypertensive heart disease with heart failure: Secondary | ICD-10-CM | POA: Diagnosis not present

## 2016-06-25 DIAGNOSIS — E222 Syndrome of inappropriate secretion of antidiuretic hormone: Secondary | ICD-10-CM | POA: Diagnosis not present

## 2016-06-25 DIAGNOSIS — D649 Anemia, unspecified: Secondary | ICD-10-CM | POA: Diagnosis not present

## 2016-06-25 DIAGNOSIS — M1991 Primary osteoarthritis, unspecified site: Secondary | ICD-10-CM | POA: Diagnosis not present

## 2016-06-25 DIAGNOSIS — I2510 Atherosclerotic heart disease of native coronary artery without angina pectoris: Secondary | ICD-10-CM | POA: Diagnosis not present

## 2016-06-25 DIAGNOSIS — I5032 Chronic diastolic (congestive) heart failure: Secondary | ICD-10-CM | POA: Diagnosis not present

## 2016-06-25 DIAGNOSIS — I110 Hypertensive heart disease with heart failure: Secondary | ICD-10-CM | POA: Diagnosis not present

## 2016-06-26 DIAGNOSIS — D649 Anemia, unspecified: Secondary | ICD-10-CM | POA: Diagnosis not present

## 2016-06-26 DIAGNOSIS — I5032 Chronic diastolic (congestive) heart failure: Secondary | ICD-10-CM | POA: Diagnosis not present

## 2016-06-26 DIAGNOSIS — E222 Syndrome of inappropriate secretion of antidiuretic hormone: Secondary | ICD-10-CM | POA: Diagnosis not present

## 2016-06-26 DIAGNOSIS — M1991 Primary osteoarthritis, unspecified site: Secondary | ICD-10-CM | POA: Diagnosis not present

## 2016-06-26 DIAGNOSIS — I110 Hypertensive heart disease with heart failure: Secondary | ICD-10-CM | POA: Diagnosis not present

## 2016-06-26 DIAGNOSIS — I2510 Atherosclerotic heart disease of native coronary artery without angina pectoris: Secondary | ICD-10-CM | POA: Diagnosis not present

## 2016-06-27 DIAGNOSIS — D649 Anemia, unspecified: Secondary | ICD-10-CM | POA: Diagnosis not present

## 2016-06-27 DIAGNOSIS — R739 Hyperglycemia, unspecified: Secondary | ICD-10-CM | POA: Diagnosis not present

## 2016-06-27 DIAGNOSIS — E222 Syndrome of inappropriate secretion of antidiuretic hormone: Secondary | ICD-10-CM | POA: Diagnosis not present

## 2016-06-27 DIAGNOSIS — I110 Hypertensive heart disease with heart failure: Secondary | ICD-10-CM | POA: Diagnosis not present

## 2016-06-27 DIAGNOSIS — M1991 Primary osteoarthritis, unspecified site: Secondary | ICD-10-CM | POA: Diagnosis not present

## 2016-06-27 DIAGNOSIS — I5032 Chronic diastolic (congestive) heart failure: Secondary | ICD-10-CM | POA: Diagnosis not present

## 2016-06-27 DIAGNOSIS — I2510 Atherosclerotic heart disease of native coronary artery without angina pectoris: Secondary | ICD-10-CM | POA: Diagnosis not present

## 2016-06-28 DIAGNOSIS — M1991 Primary osteoarthritis, unspecified site: Secondary | ICD-10-CM | POA: Diagnosis not present

## 2016-06-28 DIAGNOSIS — I110 Hypertensive heart disease with heart failure: Secondary | ICD-10-CM | POA: Diagnosis not present

## 2016-06-28 DIAGNOSIS — I5032 Chronic diastolic (congestive) heart failure: Secondary | ICD-10-CM | POA: Diagnosis not present

## 2016-06-28 DIAGNOSIS — E222 Syndrome of inappropriate secretion of antidiuretic hormone: Secondary | ICD-10-CM | POA: Diagnosis not present

## 2016-06-28 DIAGNOSIS — D649 Anemia, unspecified: Secondary | ICD-10-CM | POA: Diagnosis not present

## 2016-06-28 DIAGNOSIS — I2510 Atherosclerotic heart disease of native coronary artery without angina pectoris: Secondary | ICD-10-CM | POA: Diagnosis not present

## 2016-06-29 DIAGNOSIS — E222 Syndrome of inappropriate secretion of antidiuretic hormone: Secondary | ICD-10-CM | POA: Diagnosis not present

## 2016-06-29 DIAGNOSIS — I5032 Chronic diastolic (congestive) heart failure: Secondary | ICD-10-CM | POA: Diagnosis not present

## 2016-06-29 DIAGNOSIS — I110 Hypertensive heart disease with heart failure: Secondary | ICD-10-CM | POA: Diagnosis not present

## 2016-06-29 DIAGNOSIS — M1991 Primary osteoarthritis, unspecified site: Secondary | ICD-10-CM | POA: Diagnosis not present

## 2016-06-29 DIAGNOSIS — D649 Anemia, unspecified: Secondary | ICD-10-CM | POA: Diagnosis not present

## 2016-06-29 DIAGNOSIS — I2510 Atherosclerotic heart disease of native coronary artery without angina pectoris: Secondary | ICD-10-CM | POA: Diagnosis not present

## 2016-07-02 DIAGNOSIS — M1991 Primary osteoarthritis, unspecified site: Secondary | ICD-10-CM | POA: Diagnosis not present

## 2016-07-02 DIAGNOSIS — D649 Anemia, unspecified: Secondary | ICD-10-CM | POA: Diagnosis not present

## 2016-07-02 DIAGNOSIS — I2510 Atherosclerotic heart disease of native coronary artery without angina pectoris: Secondary | ICD-10-CM | POA: Diagnosis not present

## 2016-07-02 DIAGNOSIS — I5032 Chronic diastolic (congestive) heart failure: Secondary | ICD-10-CM | POA: Diagnosis not present

## 2016-07-02 DIAGNOSIS — E222 Syndrome of inappropriate secretion of antidiuretic hormone: Secondary | ICD-10-CM | POA: Diagnosis not present

## 2016-07-02 DIAGNOSIS — I110 Hypertensive heart disease with heart failure: Secondary | ICD-10-CM | POA: Diagnosis not present

## 2016-07-03 DIAGNOSIS — E222 Syndrome of inappropriate secretion of antidiuretic hormone: Secondary | ICD-10-CM | POA: Diagnosis not present

## 2016-07-03 DIAGNOSIS — I5032 Chronic diastolic (congestive) heart failure: Secondary | ICD-10-CM | POA: Diagnosis not present

## 2016-07-03 DIAGNOSIS — I2510 Atherosclerotic heart disease of native coronary artery without angina pectoris: Secondary | ICD-10-CM | POA: Diagnosis not present

## 2016-07-03 DIAGNOSIS — D649 Anemia, unspecified: Secondary | ICD-10-CM | POA: Diagnosis not present

## 2016-07-03 DIAGNOSIS — I110 Hypertensive heart disease with heart failure: Secondary | ICD-10-CM | POA: Diagnosis not present

## 2016-07-03 DIAGNOSIS — M1991 Primary osteoarthritis, unspecified site: Secondary | ICD-10-CM | POA: Diagnosis not present

## 2016-07-05 DIAGNOSIS — M1991 Primary osteoarthritis, unspecified site: Secondary | ICD-10-CM | POA: Diagnosis not present

## 2016-07-05 DIAGNOSIS — D649 Anemia, unspecified: Secondary | ICD-10-CM | POA: Diagnosis not present

## 2016-07-05 DIAGNOSIS — I5032 Chronic diastolic (congestive) heart failure: Secondary | ICD-10-CM | POA: Diagnosis not present

## 2016-07-05 DIAGNOSIS — I110 Hypertensive heart disease with heart failure: Secondary | ICD-10-CM | POA: Diagnosis not present

## 2016-07-05 DIAGNOSIS — E222 Syndrome of inappropriate secretion of antidiuretic hormone: Secondary | ICD-10-CM | POA: Diagnosis not present

## 2016-07-05 DIAGNOSIS — I2510 Atherosclerotic heart disease of native coronary artery without angina pectoris: Secondary | ICD-10-CM | POA: Diagnosis not present

## 2016-07-09 DIAGNOSIS — E222 Syndrome of inappropriate secretion of antidiuretic hormone: Secondary | ICD-10-CM | POA: Diagnosis not present

## 2016-07-09 DIAGNOSIS — I110 Hypertensive heart disease with heart failure: Secondary | ICD-10-CM | POA: Diagnosis not present

## 2016-07-09 DIAGNOSIS — D649 Anemia, unspecified: Secondary | ICD-10-CM | POA: Diagnosis not present

## 2016-07-09 DIAGNOSIS — M1991 Primary osteoarthritis, unspecified site: Secondary | ICD-10-CM | POA: Diagnosis not present

## 2016-07-09 DIAGNOSIS — I2510 Atherosclerotic heart disease of native coronary artery without angina pectoris: Secondary | ICD-10-CM | POA: Diagnosis not present

## 2016-07-09 DIAGNOSIS — I5032 Chronic diastolic (congestive) heart failure: Secondary | ICD-10-CM | POA: Diagnosis not present

## 2016-07-10 DIAGNOSIS — E222 Syndrome of inappropriate secretion of antidiuretic hormone: Secondary | ICD-10-CM | POA: Diagnosis not present

## 2016-07-10 DIAGNOSIS — I2510 Atherosclerotic heart disease of native coronary artery without angina pectoris: Secondary | ICD-10-CM | POA: Diagnosis not present

## 2016-07-10 DIAGNOSIS — I110 Hypertensive heart disease with heart failure: Secondary | ICD-10-CM | POA: Diagnosis not present

## 2016-07-10 DIAGNOSIS — I5032 Chronic diastolic (congestive) heart failure: Secondary | ICD-10-CM | POA: Diagnosis not present

## 2016-07-10 DIAGNOSIS — D649 Anemia, unspecified: Secondary | ICD-10-CM | POA: Diagnosis not present

## 2016-07-10 DIAGNOSIS — M1991 Primary osteoarthritis, unspecified site: Secondary | ICD-10-CM | POA: Diagnosis not present

## 2016-07-13 DIAGNOSIS — I110 Hypertensive heart disease with heart failure: Secondary | ICD-10-CM | POA: Diagnosis not present

## 2016-07-13 DIAGNOSIS — D649 Anemia, unspecified: Secondary | ICD-10-CM | POA: Diagnosis not present

## 2016-07-13 DIAGNOSIS — I5032 Chronic diastolic (congestive) heart failure: Secondary | ICD-10-CM | POA: Diagnosis not present

## 2016-07-13 DIAGNOSIS — I2510 Atherosclerotic heart disease of native coronary artery without angina pectoris: Secondary | ICD-10-CM | POA: Diagnosis not present

## 2016-07-13 DIAGNOSIS — M1991 Primary osteoarthritis, unspecified site: Secondary | ICD-10-CM | POA: Diagnosis not present

## 2016-07-13 DIAGNOSIS — E222 Syndrome of inappropriate secretion of antidiuretic hormone: Secondary | ICD-10-CM | POA: Diagnosis not present

## 2016-07-16 DIAGNOSIS — M1991 Primary osteoarthritis, unspecified site: Secondary | ICD-10-CM | POA: Diagnosis not present

## 2016-07-16 DIAGNOSIS — I5032 Chronic diastolic (congestive) heart failure: Secondary | ICD-10-CM | POA: Diagnosis not present

## 2016-07-16 DIAGNOSIS — I110 Hypertensive heart disease with heart failure: Secondary | ICD-10-CM | POA: Diagnosis not present

## 2016-07-16 DIAGNOSIS — E222 Syndrome of inappropriate secretion of antidiuretic hormone: Secondary | ICD-10-CM | POA: Diagnosis not present

## 2016-07-16 DIAGNOSIS — I2510 Atherosclerotic heart disease of native coronary artery without angina pectoris: Secondary | ICD-10-CM | POA: Diagnosis not present

## 2016-07-16 DIAGNOSIS — D649 Anemia, unspecified: Secondary | ICD-10-CM | POA: Diagnosis not present

## 2016-07-17 DIAGNOSIS — D649 Anemia, unspecified: Secondary | ICD-10-CM | POA: Diagnosis not present

## 2016-07-17 DIAGNOSIS — I5032 Chronic diastolic (congestive) heart failure: Secondary | ICD-10-CM | POA: Diagnosis not present

## 2016-07-17 DIAGNOSIS — M1991 Primary osteoarthritis, unspecified site: Secondary | ICD-10-CM | POA: Diagnosis not present

## 2016-07-17 DIAGNOSIS — I110 Hypertensive heart disease with heart failure: Secondary | ICD-10-CM | POA: Diagnosis not present

## 2016-07-17 DIAGNOSIS — I2510 Atherosclerotic heart disease of native coronary artery without angina pectoris: Secondary | ICD-10-CM | POA: Diagnosis not present

## 2016-07-17 DIAGNOSIS — E222 Syndrome of inappropriate secretion of antidiuretic hormone: Secondary | ICD-10-CM | POA: Diagnosis not present

## 2016-07-19 DIAGNOSIS — I110 Hypertensive heart disease with heart failure: Secondary | ICD-10-CM | POA: Diagnosis not present

## 2016-07-19 DIAGNOSIS — E222 Syndrome of inappropriate secretion of antidiuretic hormone: Secondary | ICD-10-CM | POA: Diagnosis not present

## 2016-07-19 DIAGNOSIS — M1991 Primary osteoarthritis, unspecified site: Secondary | ICD-10-CM | POA: Diagnosis not present

## 2016-07-19 DIAGNOSIS — D649 Anemia, unspecified: Secondary | ICD-10-CM | POA: Diagnosis not present

## 2016-07-19 DIAGNOSIS — I2510 Atherosclerotic heart disease of native coronary artery without angina pectoris: Secondary | ICD-10-CM | POA: Diagnosis not present

## 2016-07-19 DIAGNOSIS — I5032 Chronic diastolic (congestive) heart failure: Secondary | ICD-10-CM | POA: Diagnosis not present

## 2016-07-23 DIAGNOSIS — I2510 Atherosclerotic heart disease of native coronary artery without angina pectoris: Secondary | ICD-10-CM | POA: Diagnosis not present

## 2016-07-23 DIAGNOSIS — E222 Syndrome of inappropriate secretion of antidiuretic hormone: Secondary | ICD-10-CM | POA: Diagnosis not present

## 2016-07-23 DIAGNOSIS — D649 Anemia, unspecified: Secondary | ICD-10-CM | POA: Diagnosis not present

## 2016-07-23 DIAGNOSIS — M1991 Primary osteoarthritis, unspecified site: Secondary | ICD-10-CM | POA: Diagnosis not present

## 2016-07-23 DIAGNOSIS — I110 Hypertensive heart disease with heart failure: Secondary | ICD-10-CM | POA: Diagnosis not present

## 2016-07-23 DIAGNOSIS — I5032 Chronic diastolic (congestive) heart failure: Secondary | ICD-10-CM | POA: Diagnosis not present

## 2016-07-24 DIAGNOSIS — E222 Syndrome of inappropriate secretion of antidiuretic hormone: Secondary | ICD-10-CM | POA: Diagnosis not present

## 2016-07-24 DIAGNOSIS — M1991 Primary osteoarthritis, unspecified site: Secondary | ICD-10-CM | POA: Diagnosis not present

## 2016-07-24 DIAGNOSIS — I5032 Chronic diastolic (congestive) heart failure: Secondary | ICD-10-CM | POA: Diagnosis not present

## 2016-07-24 DIAGNOSIS — I110 Hypertensive heart disease with heart failure: Secondary | ICD-10-CM | POA: Diagnosis not present

## 2016-07-24 DIAGNOSIS — I2510 Atherosclerotic heart disease of native coronary artery without angina pectoris: Secondary | ICD-10-CM | POA: Diagnosis not present

## 2016-07-24 DIAGNOSIS — D649 Anemia, unspecified: Secondary | ICD-10-CM | POA: Diagnosis not present

## 2016-07-25 DIAGNOSIS — E222 Syndrome of inappropriate secretion of antidiuretic hormone: Secondary | ICD-10-CM | POA: Diagnosis not present

## 2016-07-25 DIAGNOSIS — D649 Anemia, unspecified: Secondary | ICD-10-CM | POA: Diagnosis not present

## 2016-07-25 DIAGNOSIS — I5032 Chronic diastolic (congestive) heart failure: Secondary | ICD-10-CM | POA: Diagnosis not present

## 2016-07-25 DIAGNOSIS — I2510 Atherosclerotic heart disease of native coronary artery without angina pectoris: Secondary | ICD-10-CM | POA: Diagnosis not present

## 2016-07-25 DIAGNOSIS — I110 Hypertensive heart disease with heart failure: Secondary | ICD-10-CM | POA: Diagnosis not present

## 2016-07-25 DIAGNOSIS — M1991 Primary osteoarthritis, unspecified site: Secondary | ICD-10-CM | POA: Diagnosis not present

## 2016-07-26 DIAGNOSIS — D649 Anemia, unspecified: Secondary | ICD-10-CM | POA: Diagnosis not present

## 2016-07-26 DIAGNOSIS — M1991 Primary osteoarthritis, unspecified site: Secondary | ICD-10-CM | POA: Diagnosis not present

## 2016-07-26 DIAGNOSIS — I5032 Chronic diastolic (congestive) heart failure: Secondary | ICD-10-CM | POA: Diagnosis not present

## 2016-07-26 DIAGNOSIS — I110 Hypertensive heart disease with heart failure: Secondary | ICD-10-CM | POA: Diagnosis not present

## 2016-07-26 DIAGNOSIS — I2510 Atherosclerotic heart disease of native coronary artery without angina pectoris: Secondary | ICD-10-CM | POA: Diagnosis not present

## 2016-07-26 DIAGNOSIS — E222 Syndrome of inappropriate secretion of antidiuretic hormone: Secondary | ICD-10-CM | POA: Diagnosis not present

## 2016-07-27 DIAGNOSIS — I110 Hypertensive heart disease with heart failure: Secondary | ICD-10-CM | POA: Diagnosis not present

## 2016-07-27 DIAGNOSIS — M1991 Primary osteoarthritis, unspecified site: Secondary | ICD-10-CM | POA: Diagnosis not present

## 2016-07-27 DIAGNOSIS — I2510 Atherosclerotic heart disease of native coronary artery without angina pectoris: Secondary | ICD-10-CM | POA: Diagnosis not present

## 2016-07-27 DIAGNOSIS — D649 Anemia, unspecified: Secondary | ICD-10-CM | POA: Diagnosis not present

## 2016-07-27 DIAGNOSIS — I5032 Chronic diastolic (congestive) heart failure: Secondary | ICD-10-CM | POA: Diagnosis not present

## 2016-07-27 DIAGNOSIS — E222 Syndrome of inappropriate secretion of antidiuretic hormone: Secondary | ICD-10-CM | POA: Diagnosis not present

## 2016-07-30 DIAGNOSIS — I5032 Chronic diastolic (congestive) heart failure: Secondary | ICD-10-CM | POA: Diagnosis not present

## 2016-07-30 DIAGNOSIS — I110 Hypertensive heart disease with heart failure: Secondary | ICD-10-CM | POA: Diagnosis not present

## 2016-07-30 DIAGNOSIS — M1991 Primary osteoarthritis, unspecified site: Secondary | ICD-10-CM | POA: Diagnosis not present

## 2016-07-30 DIAGNOSIS — I2510 Atherosclerotic heart disease of native coronary artery without angina pectoris: Secondary | ICD-10-CM | POA: Diagnosis not present

## 2016-07-30 DIAGNOSIS — E222 Syndrome of inappropriate secretion of antidiuretic hormone: Secondary | ICD-10-CM | POA: Diagnosis not present

## 2016-07-30 DIAGNOSIS — D649 Anemia, unspecified: Secondary | ICD-10-CM | POA: Diagnosis not present

## 2016-07-31 DIAGNOSIS — I110 Hypertensive heart disease with heart failure: Secondary | ICD-10-CM | POA: Diagnosis not present

## 2016-07-31 DIAGNOSIS — M1991 Primary osteoarthritis, unspecified site: Secondary | ICD-10-CM | POA: Diagnosis not present

## 2016-07-31 DIAGNOSIS — I2510 Atherosclerotic heart disease of native coronary artery without angina pectoris: Secondary | ICD-10-CM | POA: Diagnosis not present

## 2016-07-31 DIAGNOSIS — D649 Anemia, unspecified: Secondary | ICD-10-CM | POA: Diagnosis not present

## 2016-07-31 DIAGNOSIS — E222 Syndrome of inappropriate secretion of antidiuretic hormone: Secondary | ICD-10-CM | POA: Diagnosis not present

## 2016-07-31 DIAGNOSIS — I5032 Chronic diastolic (congestive) heart failure: Secondary | ICD-10-CM | POA: Diagnosis not present

## 2016-08-01 DIAGNOSIS — E222 Syndrome of inappropriate secretion of antidiuretic hormone: Secondary | ICD-10-CM | POA: Diagnosis not present

## 2016-08-01 DIAGNOSIS — M1991 Primary osteoarthritis, unspecified site: Secondary | ICD-10-CM | POA: Diagnosis not present

## 2016-08-01 DIAGNOSIS — I110 Hypertensive heart disease with heart failure: Secondary | ICD-10-CM | POA: Diagnosis not present

## 2016-08-01 DIAGNOSIS — D649 Anemia, unspecified: Secondary | ICD-10-CM | POA: Diagnosis not present

## 2016-08-01 DIAGNOSIS — I5032 Chronic diastolic (congestive) heart failure: Secondary | ICD-10-CM | POA: Diagnosis not present

## 2016-08-01 DIAGNOSIS — I2510 Atherosclerotic heart disease of native coronary artery without angina pectoris: Secondary | ICD-10-CM | POA: Diagnosis not present

## 2016-08-02 DIAGNOSIS — I110 Hypertensive heart disease with heart failure: Secondary | ICD-10-CM | POA: Diagnosis not present

## 2016-08-02 DIAGNOSIS — I5032 Chronic diastolic (congestive) heart failure: Secondary | ICD-10-CM | POA: Diagnosis not present

## 2016-08-02 DIAGNOSIS — M1991 Primary osteoarthritis, unspecified site: Secondary | ICD-10-CM | POA: Diagnosis not present

## 2016-08-02 DIAGNOSIS — D649 Anemia, unspecified: Secondary | ICD-10-CM | POA: Diagnosis not present

## 2016-08-02 DIAGNOSIS — I2510 Atherosclerotic heart disease of native coronary artery without angina pectoris: Secondary | ICD-10-CM | POA: Diagnosis not present

## 2016-08-02 DIAGNOSIS — E222 Syndrome of inappropriate secretion of antidiuretic hormone: Secondary | ICD-10-CM | POA: Diagnosis not present

## 2016-08-03 ENCOUNTER — Other Ambulatory Visit: Payer: Self-pay

## 2016-08-03 DIAGNOSIS — I2510 Atherosclerotic heart disease of native coronary artery without angina pectoris: Secondary | ICD-10-CM | POA: Diagnosis not present

## 2016-08-03 DIAGNOSIS — E222 Syndrome of inappropriate secretion of antidiuretic hormone: Secondary | ICD-10-CM | POA: Diagnosis not present

## 2016-08-03 DIAGNOSIS — I5032 Chronic diastolic (congestive) heart failure: Secondary | ICD-10-CM | POA: Diagnosis not present

## 2016-08-03 DIAGNOSIS — M1991 Primary osteoarthritis, unspecified site: Secondary | ICD-10-CM | POA: Diagnosis not present

## 2016-08-03 DIAGNOSIS — I110 Hypertensive heart disease with heart failure: Secondary | ICD-10-CM | POA: Diagnosis not present

## 2016-08-03 DIAGNOSIS — D649 Anemia, unspecified: Secondary | ICD-10-CM | POA: Diagnosis not present

## 2016-08-08 DIAGNOSIS — M1991 Primary osteoarthritis, unspecified site: Secondary | ICD-10-CM | POA: Diagnosis not present

## 2016-08-08 DIAGNOSIS — I5032 Chronic diastolic (congestive) heart failure: Secondary | ICD-10-CM | POA: Diagnosis not present

## 2016-08-08 DIAGNOSIS — I2510 Atherosclerotic heart disease of native coronary artery without angina pectoris: Secondary | ICD-10-CM | POA: Diagnosis not present

## 2016-08-08 DIAGNOSIS — Z853 Personal history of malignant neoplasm of breast: Secondary | ICD-10-CM | POA: Diagnosis not present

## 2016-08-08 DIAGNOSIS — E222 Syndrome of inappropriate secretion of antidiuretic hormone: Secondary | ICD-10-CM | POA: Diagnosis not present

## 2016-08-08 DIAGNOSIS — Z1231 Encounter for screening mammogram for malignant neoplasm of breast: Secondary | ICD-10-CM | POA: Diagnosis not present

## 2016-08-08 DIAGNOSIS — I110 Hypertensive heart disease with heart failure: Secondary | ICD-10-CM | POA: Diagnosis not present

## 2016-08-08 DIAGNOSIS — D649 Anemia, unspecified: Secondary | ICD-10-CM | POA: Diagnosis not present

## 2016-09-13 DIAGNOSIS — I10 Essential (primary) hypertension: Secondary | ICD-10-CM | POA: Diagnosis not present

## 2016-09-13 DIAGNOSIS — Z0000 Encounter for general adult medical examination without abnormal findings: Secondary | ICD-10-CM | POA: Diagnosis not present

## 2016-09-20 DIAGNOSIS — G309 Alzheimer's disease, unspecified: Secondary | ICD-10-CM | POA: Diagnosis not present

## 2016-09-20 DIAGNOSIS — E871 Hypo-osmolality and hyponatremia: Secondary | ICD-10-CM | POA: Diagnosis not present

## 2016-09-20 DIAGNOSIS — I10 Essential (primary) hypertension: Secondary | ICD-10-CM | POA: Diagnosis not present

## 2016-10-12 ENCOUNTER — Ambulatory Visit (INDEPENDENT_AMBULATORY_CARE_PROVIDER_SITE_OTHER): Payer: Medicare Other | Admitting: Neurology

## 2016-10-12 ENCOUNTER — Encounter: Payer: Self-pay | Admitting: Neurology

## 2016-10-12 VITALS — BP 140/78 | HR 91 | Ht 63.0 in | Wt 146.6 lb

## 2016-10-12 DIAGNOSIS — F0390 Unspecified dementia without behavioral disturbance: Secondary | ICD-10-CM | POA: Diagnosis not present

## 2016-10-12 DIAGNOSIS — F03A0 Unspecified dementia, mild, without behavioral disturbance, psychotic disturbance, mood disturbance, and anxiety: Secondary | ICD-10-CM

## 2016-10-12 NOTE — Patient Instructions (Signed)
1. Continue Aricept 10mg  daily 2. Control of blood pressure, cholesterol, as well as physical exercise and brain stimulation exercises are important for brain health 3. Would recommend no further driving 4. Continue home safety precautions, let us know if more help at home is needed 5. Follow-up in 6 months

## 2016-10-12 NOTE — Progress Notes (Signed)
NEUROLOGY FOLLOW UP OFFICE NOTE  Carla Robles [id number]  HISTORY OF PRESENT ILLNESS: I had the pleasure of seeing Carla Robles in follow-up in the neurology clinic on 10/12/2016.  The patient was last seen more than a year ago and is accompanied by sister who helps supplement the history today. Her husband is unable to come today. Her sister lives in [place]. The patient reports that her memory is "spotty." She reports that she drives, but then her sister asked her when she last drove, and she states that her husband drives majority of the time. She reports that she fell last year, but was reminded by her sister that the fall was last June, she went to rehab then had home health. She does not recall much of this. She reports burning toast. Her husband is in charge of bill payments. Her husband helps her with the medications, but she states she remembers to take them. Her sister has noticed she repeats herself. No difficulties with dressing and bathing. She gets upset easily when somebody crosses her. She is tolerating Aricept 10mg  daily without side effects. She denies any headaches, dizziness, diplopia, dysarthria, dysphagia, neck/back pain, focal numbness/tingling/weakness, bowel/bladder dysfunction, anosmia, tremors.  HPI 07/07/15: This is a pleasant 82 yo RH woman with a history of hypertension, hyperlipidemia, breast cancer, NSTEMI, who presented for worsening memory. She states "I knows I have some issues with it," because she would have to ask her husband things every once in a while. Her husband started noticing memory changes a little over a year ago, but it has become more noticeable recently, where friends have started noticing as well. A few months ago, she was supposed to meet her friend at a certain location, but forgot their meeting place and went to a different place. She forgets that she makes appointments with her friends. She continues to drive, but has called him at least 2-3 times that she  was turned around and not sure how to get back. She does have a GPS in her car but did not know what address to put in. Her husband started putting her medications in a pillbox a couple of months ago to make sure she takes them, and they report she overall remembers her medications. She denies any missed bill payments except for 1 notice they got yesterday. She has occasional word-finding difficulties and problems multitasking. No personality changes. She started Aricept a few months ago, and initially reported soft stools, which she denies today. She denies any side effects on Aricept. She has occasional mild bilateral hand tremors. She denies any significant head injuries. Her father had memory problems.   Laboratory Data: 04/2015: CBC, CMP unremarkable. TSH 3.810, B12 356, vitamin D 42.8 I personally reviewed MRI brain without contrast which showed mild to moderate generalized atrophy and chronic microvascular disease, no acute changes.  PAST MEDICAL HISTORY: Past Medical History:  Diagnosis Date  . Arthritis   . Breast cancer ([place])   . Chronic diastolic CHF (congestive heart failure) ([place])   . Cognitive impairment   . Hx of echocardiogram    Echo (9/14):  Mild focal basal septal hypertrophy, EF 55-60%, normal wall motion, Gr 1 DD   . Hypercholesteremia   . Hypertension   . Hyponatremia   . Leukocytosis   . NSTEMI (non-ST elevated myocardial infarction) ([place])    in setting of low Na:  Echo 7/22:  EF 60-65%, normal wall motion, mild LVH, grade 1 diast dysfxn, PASP 29.;  Myoview:  EF 85% and no ischemia  . Personal history of  adenomatouscolonic polyps 04/22/2012   2 diminutive adenomas 12/2006 [name])  . Physical deconditioning   . Risk for falls   . SIADH (syndrome of inappropriate ADH production) ([place])   . Urinary retention     MEDICATIONS: Current Outpatient Prescriptions on File Prior to Visit  Medication Sig Dispense Refill  . albuterol (PROVENTIL HFA;VENTOLIN HFA) 108 (90  Base) MCG/ACT inhaler Inhale 2 puffs into the lungs every 6 (six) hours as needed for wheezing or shortness of breath. 1 Inhaler 2  . aspirin 81 MG tablet Take 81 mg by mouth daily.      . Calcium Carbonate-Vitamin D (CALTRATE 600+D) 600-400 MG-UNIT per chew tablet Chew 1 tablet by mouth 2 (two) times daily.     Marland Kitchen donepezil (ARICEPT) 10 MG tablet Take 10 mg by mouth at bedtime.    . fish oil-omega-3 fatty acids 1000 MG capsule Take 1 g by mouth daily.      . fluconazole (DIFLUCAN) 150 MG tablet Take 1 tablet (150 mg total) by mouth once a week. 3 tablet 0  . metoprolol succinate (TOPROL-XL) 25 MG 24 hr tablet Take 12.5 mg by mouth daily.    . Multiple Vitamin (MULTIVITAMIN) tablet Take 1 tablet by mouth daily.       No current facility-administered medications on file prior to visit.     ALLERGIES: Allergies  Allergen Reactions  . Ace Inhibitors Other (See Comments)    Hyponatremia  . Other     hmg coa redustase inhibitors - muscle aches  . Statins Other (See Comments)    Muscle aches     FAMILY HISTORY: Family History  Problem Relation Age of Onset  . Diabetes Father   . Hypertension Mother   . Stroke Father     SOCIAL HISTORY: Social History   Social History  . Marital status: Married    Spouse name: N/A  . Number of children: 3  . Years of education: N/A   Occupational History  . Retired    Social History Main Topics  . Smoking status: Former Smoker    Types: Cigarettes  . Smokeless tobacco: Never Used     Comment: quit 1969  . Alcohol use 0.0 oz/week     Comment: occ wine  . Drug use: No  . Sexual activity: Not on file   Other Topics Concern  . Not on file   Social History Narrative  . No narrative on file    REVIEW OF SYSTEMS: Constitutional: No fevers, chills, or sweats, no generalized fatigue, change in appetite Eyes: No visual changes, double vision, eye pain Ear, nose and throat: No hearing loss, ear pain, nasal congestion, sore  throat Cardiovascular: No chest pain, palpitations Respiratory:  No shortness of breath at rest or with exertion, wheezes GastrointestinaI: No nausea, vomiting, diarrhea, abdominal pain, fecal incontinence Genitourinary:  No dysuria, urinary retention or frequency Musculoskeletal:  No neck pain, back pain Integumentary: No rash, pruritus, skin lesions Neurological: as above Psychiatric: No depression, insomnia, anxiety Endocrine: No palpitations, fatigue, diaphoresis, mood swings, change in appetite, change in weight, increased thirst Hematologic/Lymphatic:  No anemia, purpura, petechiae. Allergic/Immunologic: no itchy/runny eyes, nasal congestion, recent allergic reactions, rashes  PHYSICAL EXAM: Vitals:   10/12/16 1340  BP: 140/78  Pulse: 91   General: No acute distress Head:  Normocephalic/atraumatic Neck: supple, no paraspinal tenderness, full range of motion Heart:  Regular rate and rhythm Lungs:  Clear to auscultation bilaterally  Back: No paraspinal tenderness Skin/Extremities: No rash, no edema Neurological Exam: alert and oriented to person, place, and time. No aphasia or dysarthria. Fund of knowledge is appropriate.  Recent and remote memory are intact.  Attention and concentration are normal.    Able to name objects and repeat phrases. CDT 4/5  MMSE - Mini Mental State Exam 10/12/2016 07/07/2015  Orientation to time 3 5  Orientation to Place 5 4  Registration 3 3  Attention/ Calculation 5 5  Recall 0 1  Language- name 2 objects 2 2  Language- repeat 1 1  Language- follow 3 step command 3 3  Language- read & follow direction 1 1  Write a sentence 1 1  Copy design 1 1  Total score 25 27   Cranial nerves: Pupils equal, round, reactive to light.  Extraocular movements intact with no nystagmus. Visual fields full. Facial sensation intact. No facial asymmetry. Tongue, uvula, palate midline.  Motor: Bulk and tone normal, muscle strength 5/5 throughout with no pronator drift.   Sensation to light touch intact.  No extinction to double simultaneous stimulation.  Deep tendon reflexes 2+ throughout, toes downgoing.  Finger to nose testing intact.  Gait narrow-based and steady, able to tandem walk adequately.  Romberg negative.  IMPRESSION: This is a pleasant 82 yo RH woman with a history of hypertension, hyperlipidemia, NSTEMI, breast cancer, with worsening memory loss. Her MMSE today is 25/30, showing a decline from last visit. She is taking Aricept 10mg  daily. MRI brain did not show any acute changes. Symptoms suggestive of mild dementia, likely Alzheimer's type. Her sister seems to have difficulty reporting her symptoms as this may upset the patient. I have asked that she let her husband know to call if any concerns. Continue current dose of Aricept, if her husband reports significant worsening, we will consider adding on Namenda. Recommended no further driving. We discussed home safety and potential need for more help at home in the future. We again discussed the importance of physical exercise and brain stimulation exercises for brain health. She will follow-up in 6 months.   Thank you for allowing me to participate in her care.  Please do not hesitate to call for any questions or concerns.  The duration of this appointment visit was 25 minutes of face-to-face time with the patient.  Greater than 50% of this time was spent in counseling, explanation of diagnosis, planning of further management, and coordination of care.   Ellouise Newer, M.D.   CC: Dr. Noah Delaine

## 2016-10-16 DIAGNOSIS — F0390 Unspecified dementia without behavioral disturbance: Secondary | ICD-10-CM | POA: Insufficient documentation

## 2016-10-16 DIAGNOSIS — F03B0 Unspecified dementia, moderate, without behavioral disturbance, psychotic disturbance, mood disturbance, and anxiety: Secondary | ICD-10-CM | POA: Insufficient documentation

## 2016-11-19 DIAGNOSIS — Z79899 Other long term (current) drug therapy: Secondary | ICD-10-CM | POA: Diagnosis not present

## 2016-11-19 DIAGNOSIS — G309 Alzheimer's disease, unspecified: Secondary | ICD-10-CM | POA: Diagnosis not present

## 2016-11-19 DIAGNOSIS — R5383 Other fatigue: Secondary | ICD-10-CM | POA: Diagnosis not present

## 2016-11-19 DIAGNOSIS — I10 Essential (primary) hypertension: Secondary | ICD-10-CM | POA: Diagnosis not present

## 2017-03-05 IMAGING — CT CT MAXILLOFACIAL W/O CM
4 of 7 series · 17 of 47 positions shown, 19 images · non-contrast
Comparison: Brain MRI 04/29/2015

CLINICAL DATA: Fall with head injury. Right-sided facial bruising.
Initial encounter.

EXAM:
CT HEAD WITHOUT CONTRAST
CT MAXILLOFACIAL WITHOUT CONTRAST
TECHNIQUE: Multidetector CT imaging of the head and maxillofacial structures
were performed using the standard protocol without intravenous
contrast. Multiplanar CT image reconstructions of the maxillofacial
structures were also generated.

[Series 3: head wo · axial · 0.49mm/px · z∈[-310,-220]mm · 7 of 26 slices shown, 9 images]
[im 4/26  brain]
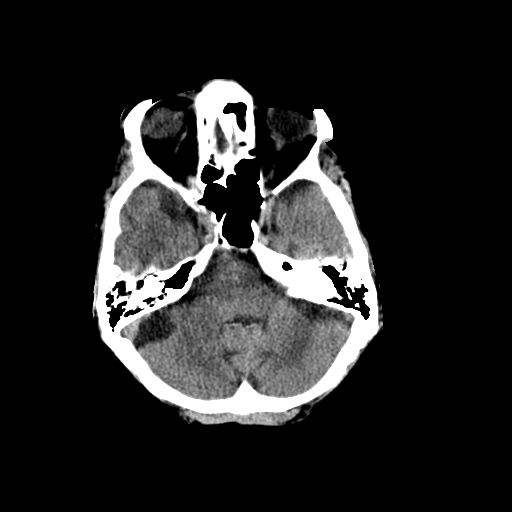
[im 4/26  bone]
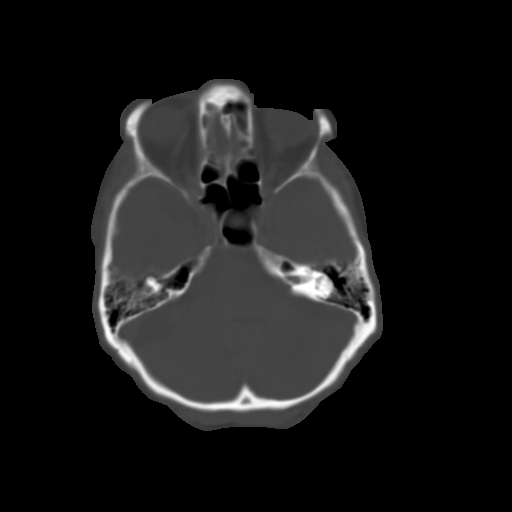
[im 7/26  bone]
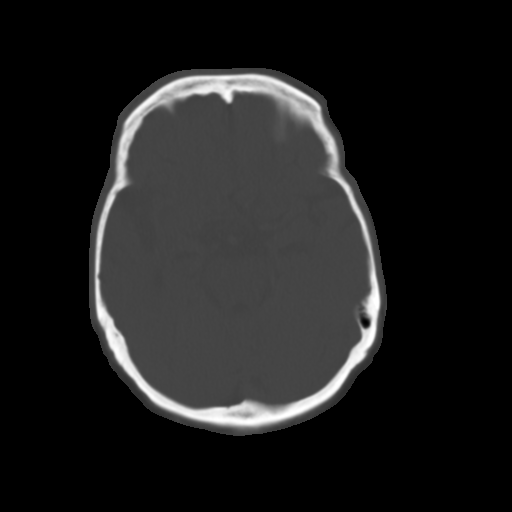
[im 10/26  bone]
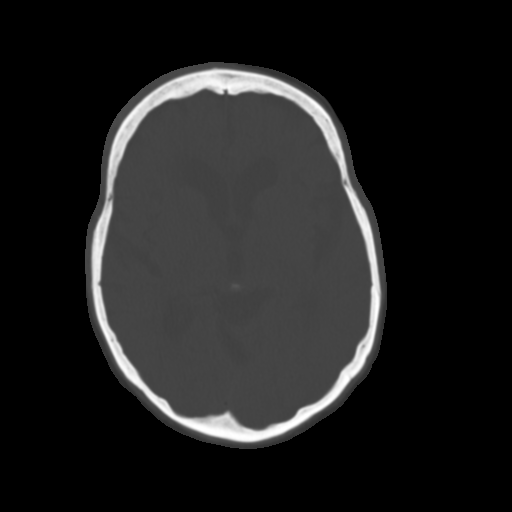
[im 13/26  bone]
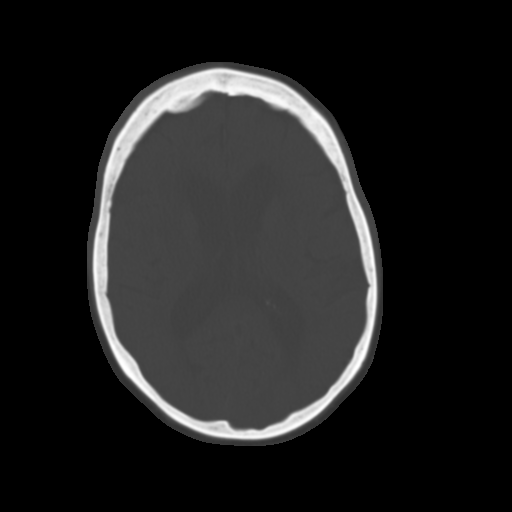
[im 16/26  brain]
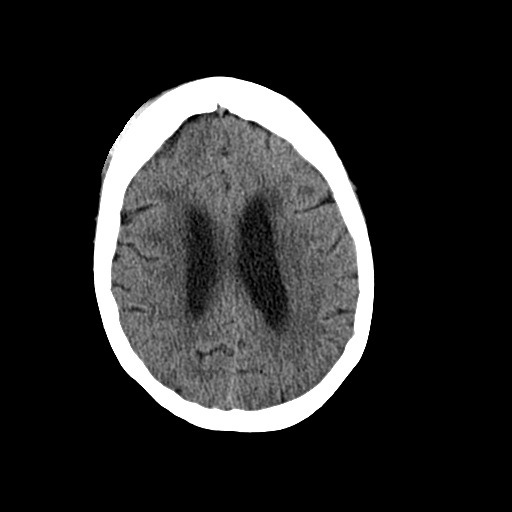
[im 16/26  bone]
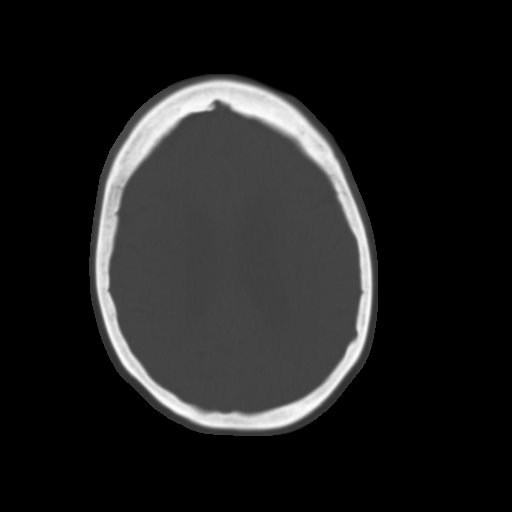
[im 19/26  bone]
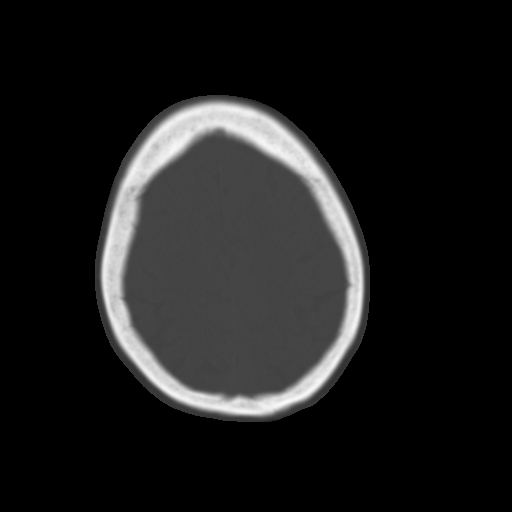
[im 22/26  bone]
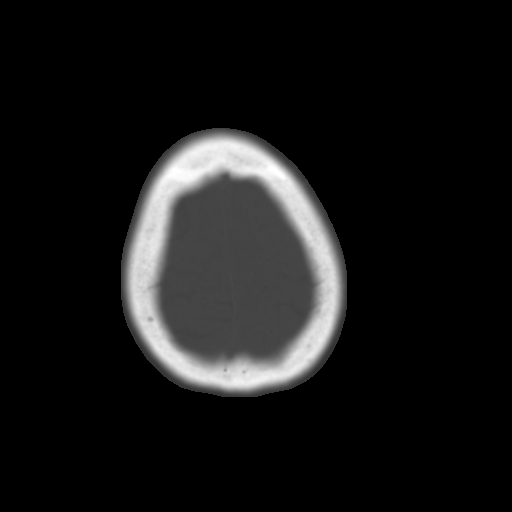

[Series 5: cor head wo · coronal · 0.29mm/px · 3 of 74 slices shown]
[im 15/74  bone]
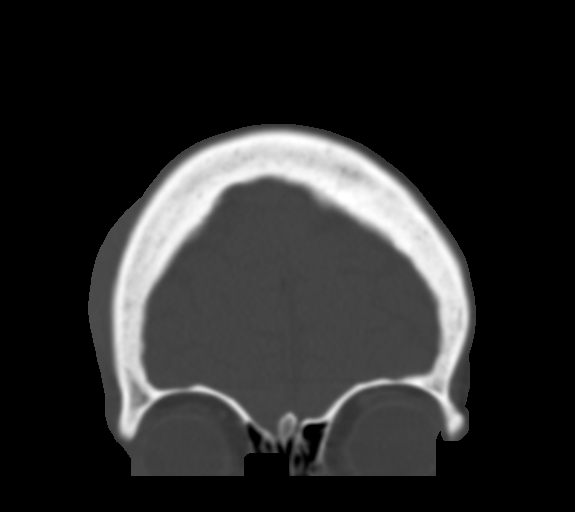
[im 30/74  bone]
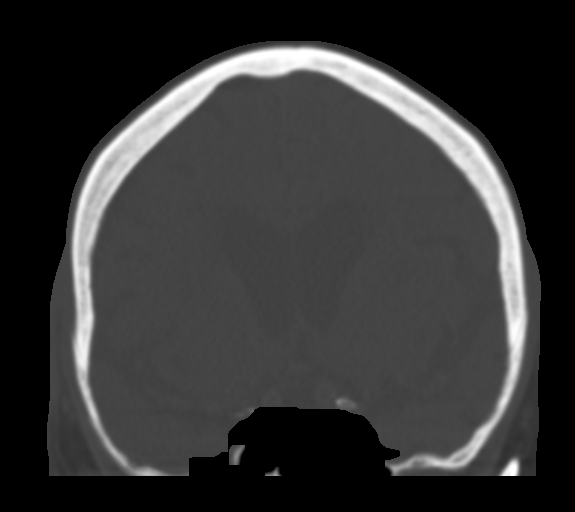
[im 44/74  bone]
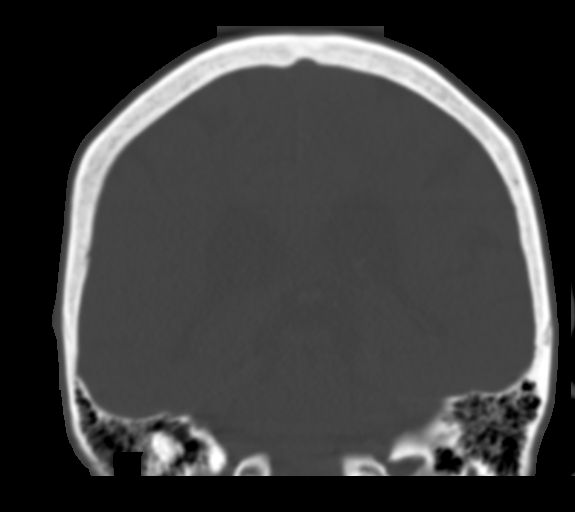

[Series 7: max soft · axial · 0.34mm/px · z∈[-398,-326]mm · 6 of 62 slices shown]
[im 7/62  brain]
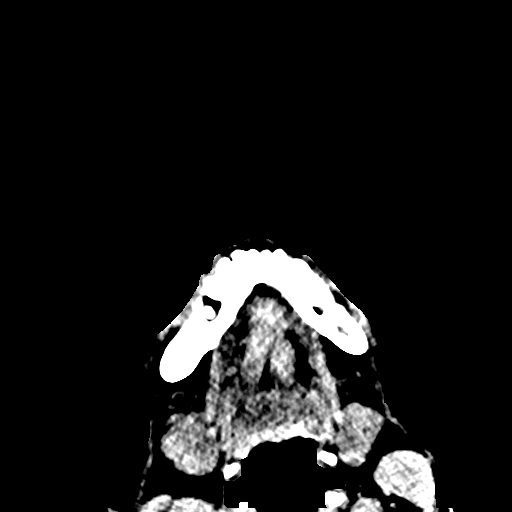
[im 13/62  brain]
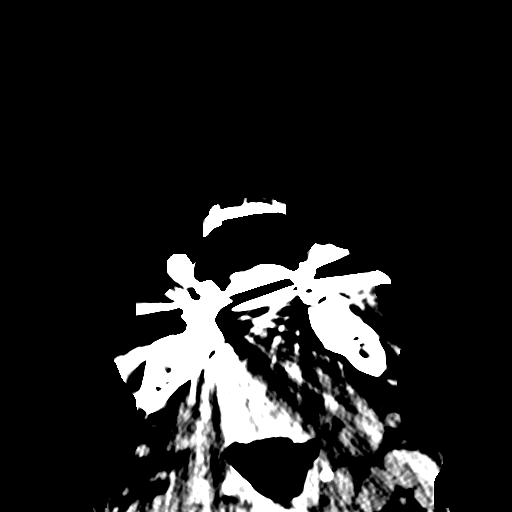
[im 19/62  brain]
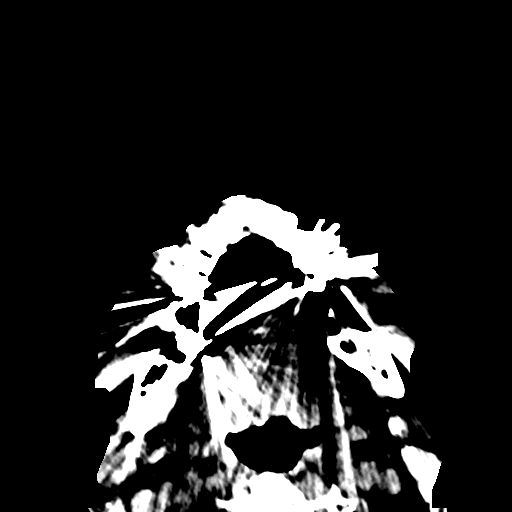
[im 28/62  brain]
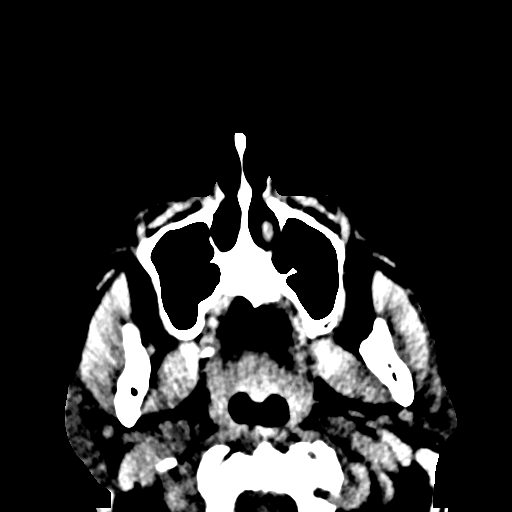
[im 34/62  brain]
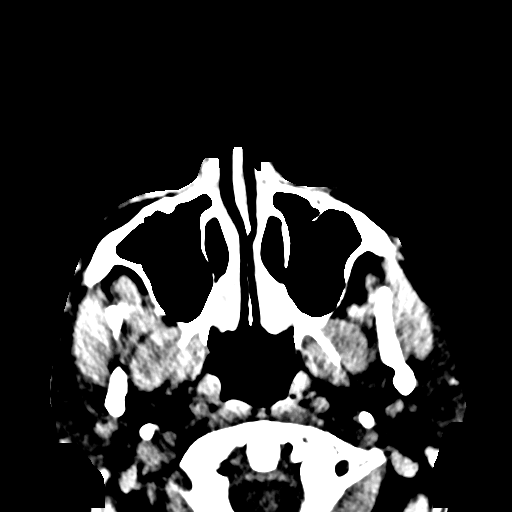
[im 43/62  brain]
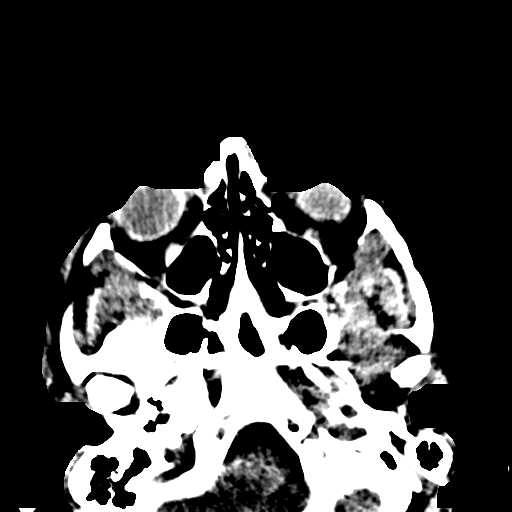

[Series 12: sagittal bone · sagittal · 0.30mm/px · 1 of 87 slices shown]
[im 44/87  bone]
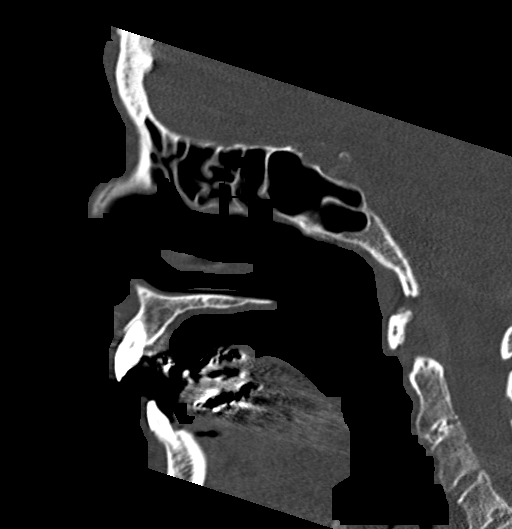

[17 of 47 positions shown; findings below may reference images not displayed]

FINDINGS: CT HEAD FINDINGS

Skull and Sinuses:Facial findings described below. Right frontal
scalp and forehead hematoma without calvarial fracture.

Brain: No evidence of acute infarction, hemorrhage, hydrocephalus,
or mass lesion/mass effect. Atrophy with ventriculomegaly, stable
from 6471. Mild to moderate chronic microvascular disease with
ischemic gliosis confluent around the frontal horns of the lateral
ventricles.

CT MAXILLOFACIAL FINDINGS

The inferior mandibular symphysis is not visualized, but no signs of
injury at this level.

Right forehead hematoma without facial fracture or mandibular
dislocation. No hemo sinus. Bilateral cataract resection. No
evidence of globe or other postseptal injury.

Right lower second premolar has a large periapical erosion but has
undergone root canal.
IMPRESSION: 1. No evidence of intracranial injury.
2. Right forehead hematoma without underlying fracture.

## 2017-03-07 IMAGING — CR DG CHEST 1V PORT
1 series · 1 of 1 positions shown · non-contrast
Comparison: 05/04/2016

CLINICAL DATA: Encounter for central line placement

EXAM:
PORTABLE CHEST 1 VIEW

[AP]
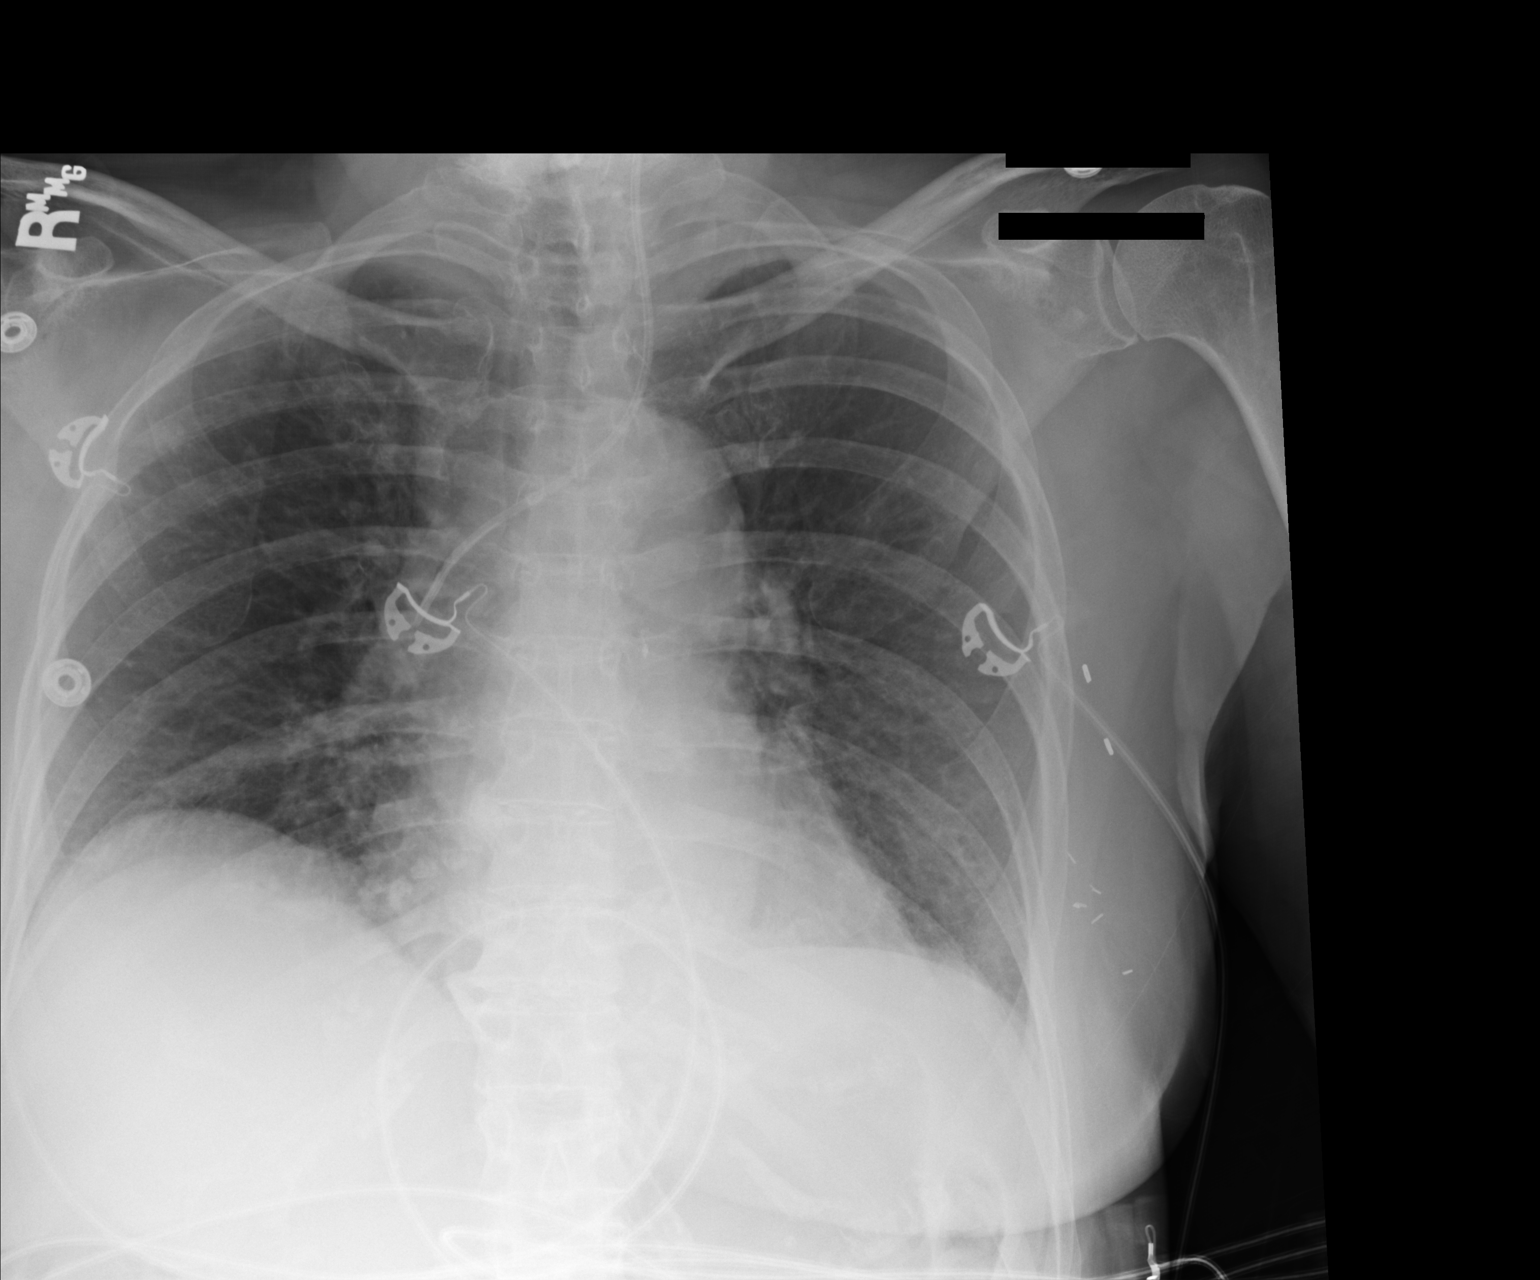

[1 of 1 positions shown; findings below may reference images not displayed]

FINDINGS: Left IJ central line with tip at the SVC.

Normal heart size and stable mediastinal contours when allowing for
differences in technique. Low volume chest with interstitial
crowding. Stable biapical pleural thickening. Postoperative changes
left breast and axilla.
IMPRESSION: 1. New left IJ catheter without adverse finding.
2. Low volume chest.

## 2017-04-16 ENCOUNTER — Ambulatory Visit (INDEPENDENT_AMBULATORY_CARE_PROVIDER_SITE_OTHER): Payer: Medicare Other | Admitting: Neurology

## 2017-04-16 ENCOUNTER — Encounter: Payer: Self-pay | Admitting: Neurology

## 2017-04-16 VITALS — BP 114/58 | HR 86 | Ht 62.0 in | Wt 154.0 lb

## 2017-04-16 DIAGNOSIS — F0390 Unspecified dementia without behavioral disturbance: Secondary | ICD-10-CM

## 2017-04-16 DIAGNOSIS — F03A0 Unspecified dementia, mild, without behavioral disturbance, psychotic disturbance, mood disturbance, and anxiety: Secondary | ICD-10-CM

## 2017-04-16 MED ORDER — MEMANTINE HCL 5 MG PO TABS: ORAL_TABLET | ORAL | 3 refills | Status: DC

## 2017-04-16 MED ORDER — DONEPEZIL HCL 10 MG PO TABS: 10.0000 mg | ORAL_TABLET | Freq: Every day | ORAL | 3 refills | Status: DC

## 2017-04-16 NOTE — Progress Notes (Signed)
NEUROLOGY FOLLOW UP OFFICE NOTE  Carla Robles [id number]  HISTORY OF PRESENT ILLNESS: I had the pleasure of seeing Carla Robles in follow-up in the neurology clinic on 04/16/2017.  The patient was last seen 6 months ago for mild dementia and is again accompanied by her husband who helps supplement the history today. Since her last visit, she reports memory is "relatively good." Her husband shakes his head. She denies getting lost driving. Her husband reports she has not been driving for a year. She states she cooks, her husband told her she does not cook anymore, he cooks and she helps with dishes. He is in charge of bills and her medications. She frequently misplaces her glasses, but otherwise does well. No difficulties with dressing and bathing independently. No personality changes, no hallucinations. She is tolerating Aricept 10mg  daily without side effects. She denies any headaches, dizziness, diplopia, dysarthria, dysphagia, neck/back pain, focal numbness/tingling/weakness, bowel/bladder dysfunction, anosmia, tremors. No falls.   HPI 07/07/15: This is a pleasant 83 yo RH woman with a history of hypertension, hyperlipidemia, breast cancer, NSTEMI, who presented for worsening memory. She states "I knows I have some issues with it," because she would have to ask her husband things every once in a while. Her husband started noticing memory changes a little over a year ago, but it has become more noticeable recently, where friends have started noticing as well. A few months ago, she was supposed to meet her friend at a certain location, but forgot their meeting place and went to a different place. She forgets that she makes appointments with her friends. She continues to drive, but has called him at least 2-3 times that she was turned around and not sure how to get back. She does have a GPS in her car but did not know what address to put in. Her husband started putting her medications in a pillbox a couple of  months ago to make sure she takes them, and they report she overall remembers her medications. She denies any missed bill payments except for 1 notice they got yesterday. She has occasional word-finding difficulties and problems multitasking. No personality changes. She started Aricept a few months ago, and initially reported soft stools, which she denies today. She denies any side effects on Aricept. She has occasional mild bilateral hand tremors. She denies any significant head injuries. Her father had memory problems.   Laboratory Data: 04/2015: CBC, CMP unremarkable. TSH 3.810, B12 356, vitamin D 42.8 I personally reviewed MRI brain without contrast which showed mild to moderate generalized atrophy and chronic microvascular disease, no acute changes.  PAST MEDICAL HISTORY: Past Medical History:  Diagnosis Date  . Arthritis   . Breast cancer ([place])   . Chronic diastolic CHF (congestive heart failure) ([place])   . Cognitive impairment   . Hx of echocardiogram    Echo (9/14):  Mild focal basal septal hypertrophy, EF 55-60%, normal wall motion, Gr 1 DD   . Hypercholesteremia   . Hypertension   . Hyponatremia   . Leukocytosis   . NSTEMI (non-ST elevated myocardial infarction) ([place])    in setting of low Na:  Echo 7/22:  EF 60-65%, normal wall motion, mild LVH, grade 1 diast dysfxn, PASP 29.;  Myoview:  EF 85% and no ischemia  . Personal history of  adenomatouscolonic polyps 04/22/2012   2 diminutive adenomas 12/2006 [name])  . Physical deconditioning   . Risk for falls   . SIADH (syndrome of inappropriate ADH production) ([place])   .  Urinary retention     MEDICATIONS: Current Outpatient Prescriptions on File Prior to Visit  Medication Sig Dispense Refill  . aspirin 81 MG tablet Take 81 mg by mouth daily.      . Calcium Carbonate-Vitamin D (CALTRATE 600+D) 600-400 MG-UNIT per chew tablet Chew 1 tablet by mouth 2 (two) times daily.     Marland Kitchen donepezil (ARICEPT) 10 MG tablet Take 10 mg by mouth at  bedtime.    . fish oil-omega-3 fatty acids 1000 MG capsule Take 1 g by mouth daily.      Marland Kitchen lisinopril (PRINIVIL,ZESTRIL) 5 MG tablet     . Multiple Vitamin (MULTIVITAMIN) tablet Take 1 tablet by mouth daily.       No current facility-administered medications on file prior to visit.     ALLERGIES: Allergies  Allergen Reactions  . Ace Inhibitors Other (See Comments)    Hyponatremia  . Other     hmg coa redustase inhibitors - muscle aches  . Statins Other (See Comments)    Muscle aches     FAMILY HISTORY: Family History  Problem Relation Age of Onset  . Diabetes Father   . Stroke Father   . Hypertension Mother     SOCIAL HISTORY: Social History   Social History  . Marital status: Married    Spouse name: N/A  . Number of children: 3  . Years of education: N/A   Occupational History  . Retired    Social History Main Topics  . Smoking status: Former Smoker    Types: Cigarettes  . Smokeless tobacco: Never Used     Comment: quit 1969  . Alcohol use 0.0 oz/week     Comment: occ wine  . Drug use: No  . Sexual activity: Not on file   Other Topics Concern  . Not on file   Social History Narrative  . No narrative on file    REVIEW OF SYSTEMS: Constitutional: No fevers, chills, or sweats, no generalized fatigue, change in appetite Eyes: No visual changes, double vision, eye pain Ear, nose and throat: No hearing loss, ear pain, nasal congestion, sore throat Cardiovascular: No chest pain, palpitations Respiratory:  No shortness of breath at rest or with exertion, wheezes GastrointestinaI: No nausea, vomiting, diarrhea, abdominal pain, fecal incontinence Genitourinary:  No dysuria, urinary retention or frequency Musculoskeletal:  No neck pain, back pain Integumentary: No rash, pruritus, skin lesions Neurological: as above Psychiatric: No depression, insomnia, anxiety Endocrine: No palpitations, fatigue, diaphoresis, mood swings, change in appetite, change in  weight, increased thirst Hematologic/Lymphatic:  No anemia, purpura, petechiae. Allergic/Immunologic: no itchy/runny eyes, nasal congestion, recent allergic reactions, rashes  PHYSICAL EXAM: Vitals:   04/16/17 1519  BP: (!) 114/58  Pulse: 86   General: No acute distress Head:  Normocephalic/atraumatic Neck: supple, no paraspinal tenderness, full range of motion Heart:  Regular rate and rhythm Lungs:  Clear to auscultation bilaterally Back: No paraspinal tenderness Skin/Extremities: No rash, no edema Neurological Exam: alert and oriented to person, place, season. States it is 2014, Wed (it is 2018, Tues). No aphasia or dysarthria. Fund of knowledge is appropriate.  Recent and remote memory are impaired.  Attention and concentration are normal.    Able to name objects and repeat phrases. CDT 4/5  MMSE - Mini Mental State Exam 04/16/2017 10/12/2016 07/07/2015  Orientation to time 1 3 5   Orientation to Place 4 5 4   Registration 3 3 3   Attention/ Calculation 5 5 5   Recall 0 0 1  Language- name  2 objects 2 2 2   Language- repeat 1 1 1   Language- follow 3 step command 3 3 3   Language- read & follow direction 1 1 1   Write a sentence 1 1 1   Copy design 1 1 1   Total score 22 25 27    Cranial nerves: Pupils equal, round, reactive to light.  Extraocular movements intact with no nystagmus. Visual fields full. Facial sensation intact. No facial asymmetry. Tongue, uvula, palate midline.  Motor: Bulk and tone normal, muscle strength 5/5 throughout with no pronator drift.  Sensation to light touch intact.  No extinction to double simultaneous stimulation.  Deep tendon reflexes +1 throughout, toes downgoing.  Finger to nose testing intact.  Gait narrow-based and steady, able to tandem walk adequately.  Romberg negative.  IMPRESSION: This is a pleasant 83 yo RH woman with a history of hypertension, hyperlipidemia, NSTEMI, breast cancer, with worsening memory loss. MRI brain did not show any acute changes.  Her MMSE today is 22/30, (25/30 in November 2017), showing a decline from last visit. She is taking Aricept 10mg  daily, we will add on Namenda 5mg  x 1 month, then increase to 1 tablet BID. Side effects and expectations were discussed with them today. She is not driving. We again discussed the importance of physical exercise and brain stimulation exercises for brain health. She will follow-up in 6 months.   Thank you for allowing me to participate in her care.  Please do not hesitate to call for any questions or concerns.  The duration of this appointment visit was 25 minutes of face-to-face time with the patient.  Greater than 50% of this time was spent in counseling, explanation of diagnosis, planning of further management, and coordination of care.   Ellouise Newer, M.D.   CC: Dr. Noah Delaine

## 2017-04-16 NOTE — Patient Instructions (Signed)
1. Start Namenda 5mg : Take 1 tablet daily for 1 month, then increase to 1 tablet twice a day 2. Continue Aricept 10mg  daily 3. Control of blood pressure, cholesterol, as well as physical exercise and brain stimulation exercises are important for brain health 4. Follow-up in 6 months, call for any changes

## 2017-06-11 ENCOUNTER — Encounter: Payer: Self-pay | Admitting: Internal Medicine

## 2017-07-11 ENCOUNTER — Emergency Department (HOSPITAL_BASED_OUTPATIENT_CLINIC_OR_DEPARTMENT_OTHER): Payer: Medicare Other

## 2017-07-11 ENCOUNTER — Emergency Department (HOSPITAL_BASED_OUTPATIENT_CLINIC_OR_DEPARTMENT_OTHER)
Admission: EM | Admit: 2017-07-11 | Discharge: 2017-07-11 | Disposition: A | Discharge status: Home / Self Care | Payer: Medicare Other | Attending: Emergency Medicine | Admitting: Emergency Medicine

## 2017-07-11 ENCOUNTER — Encounter (HOSPITAL_BASED_OUTPATIENT_CLINIC_OR_DEPARTMENT_OTHER): Payer: Self-pay | Admitting: *Deleted

## 2017-07-11 DIAGNOSIS — Z87891 Personal history of nicotine dependence: Secondary | ICD-10-CM | POA: Diagnosis not present

## 2017-07-11 DIAGNOSIS — I110 Hypertensive heart disease with heart failure: Secondary | ICD-10-CM | POA: Insufficient documentation

## 2017-07-11 DIAGNOSIS — Z79899 Other long term (current) drug therapy: Secondary | ICD-10-CM | POA: Diagnosis not present

## 2017-07-11 DIAGNOSIS — Z7982 Long term (current) use of aspirin: Secondary | ICD-10-CM | POA: Insufficient documentation

## 2017-07-11 DIAGNOSIS — Z853 Personal history of malignant neoplasm of breast: Secondary | ICD-10-CM | POA: Insufficient documentation

## 2017-07-11 DIAGNOSIS — M25551 Pain in right hip: Secondary | ICD-10-CM | POA: Diagnosis not present

## 2017-07-11 DIAGNOSIS — F0390 Unspecified dementia without behavioral disturbance: Secondary | ICD-10-CM | POA: Insufficient documentation

## 2017-07-11 DIAGNOSIS — I5032 Chronic diastolic (congestive) heart failure: Secondary | ICD-10-CM | POA: Insufficient documentation

## 2017-07-11 NOTE — ED Provider Notes (Signed)
[facility] DEPT [facility] Provider Note   CSN: [id number] Arrival date & time: 07/11/17  1057     History   Chief Complaint Chief Complaint  Patient presents with  . Hip Pain    HPI Carla Robles is a 83 y.o. female.  HPI  83 year old female presents with right lateral hip pain for about one week. No trauma that she can recall. She has occasionally taken Tylenol for the pain. She tried ice which did seem to help for a little bit. No weakness or numbness. She has not noticed any swelling or skin changes. At rest she feels fine, pain is most prominent when walking. Pain is moderate. She declines pain medicine currently.  Past Medical History:  Diagnosis Date  . Arthritis   . Breast cancer ([place])   . Chronic diastolic CHF (congestive heart failure) ([place])   . Cognitive impairment   . Hx of echocardiogram    Echo (9/14):  Mild focal basal septal hypertrophy, EF 55-60%, normal wall motion, Gr 1 DD   . Hypercholesteremia   . Hypertension   . Hyponatremia   . Leukocytosis   . NSTEMI (non-ST elevated myocardial infarction) ([place])    in setting of low Na:  Echo 7/22:  EF 60-65%, normal wall motion, mild LVH, grade 1 diast dysfxn, PASP 29.;  Myoview:  EF 85% and no ischemia  . Personal history of  adenomatouscolonic polyps 04/22/2012   2 diminutive adenomas 12/2006 [name])  . Physical deconditioning   . Risk for falls   . SIADH (syndrome of inappropriate ADH production) ([place])   . Urinary retention     Patient Active Problem List   Diagnosis Date Noted  . Mild dementia 10/16/2016  . Urinary tract infection, site not specified 05/05/2016  . Pulmonary vascular congestion 05/05/2016  . SIADH (syndrome of inappropriate ADH production) ([place]) 05/05/2016  . Hypercholesteremia 05/05/2016  . Hyponatremia 05/04/2016  . Mild cognitive impairment 07/07/2015  . Personal history of  adenomatouscolonic polyps 04/22/2012  . Cardiac enzymes elevated 09/07/2011  . Essential hypertension 07/13/2011    . Hyperlipidemia 07/13/2011    Past Surgical History:  Procedure Laterality Date  . BREAST LUMPECTOMY    . CATARACT EXTRACTION W/ INTRAOCULAR LENS  IMPLANT, BILATERAL  2011  . COLONOSCOPY      OB History    No data available       Home Medications    Prior to Admission medications   Medication Sig Start Date End Date Taking? Authorizing Provider  alendronate (FOSAMAX) 10 MG tablet Take 10 mg by mouth daily before breakfast. Take with a full glass of water on an empty stomach.    [provider]  aspirin 81 MG tablet Take 81 mg by mouth daily.      [provider]  calcium carbonate (OS-CAL) 600 MG TABS tablet Take by mouth.    [provider]  calcium carbonate (OS-CAL) 600 MG TABS tablet Take by mouth.    [provider]  calcium carbonate (OS-CAL) 600 MG TABS tablet Take by mouth.    [provider]  Calcium Carbonate-Vitamin D (CALTRATE 600+D) 600-400 MG-UNIT per chew tablet Chew 1 tablet by mouth 2 (two) times daily.     [provider]  Cholecalciferol (VITAMIN D3) 1000 units CAPS Take by mouth.    [provider]  donepezil (ARICEPT) 10 MG tablet Take 1 tablet (10 mg total) by mouth at bedtime. 04/16/17   Cameron Sprang, MD  fish oil-omega-3 fatty acids  1000 MG capsule Take 1 g by mouth daily.      [provider]  lisinopril (PRINIVIL,ZESTRIL) 5 MG tablet  09/19/16   [provider]  memantine (NAMENDA) 5 MG tablet Take 1 tablet twice a day 04/16/17   Cameron Sprang, MD  Multiple Vitamin (MULTIVITAMIN) tablet Take 1 tablet by mouth daily.      [provider]    Family History Family History  Problem Relation Age of Onset  . Diabetes Father   . Stroke Father   . Hypertension Mother     Social History Social History  Substance Use Topics  . Smoking status: Former Smoker    Types: Cigarettes  . Smokeless tobacco: Never Used     Comment: quit 1969  . Alcohol use 0.0 oz/week      Comment: occ wine     Allergies   Ace inhibitors; Other; and Statins   Review of Systems Review of Systems  Constitutional: Negative for fever.  Musculoskeletal: Positive for arthralgias. Negative for joint swelling.  Skin: Negative for color change and wound.  Neurological: Negative for weakness and numbness.     Physical Exam Updated Vital Signs BP 139/70   Pulse 80   Temp 97.9 F (36.6 C) (Oral)   Resp 20   Ht 5' 3.5" (1.613 m)   Wt 70.3 kg (155 lb)   SpO2 96%   BMI 27.03 kg/m   Physical Exam  Constitutional: She is oriented to person, place, and time. She appears well-developed and well-nourished. No distress.  HENT:  Head: Normocephalic and atraumatic.  Right Ear: External ear normal.  Left Ear: External ear normal.  Nose: Nose normal.  Eyes: Right eye exhibits no discharge. Left eye exhibits no discharge.  Cardiovascular: Normal rate and regular rhythm.   Pulses:      Dorsalis pedis pulses are 2+ on the right side, and 2+ on the left side.  Pulmonary/Chest: Effort normal and breath sounds normal.  Abdominal: Soft. There is no tenderness.  Musculoskeletal:       Right hip: She exhibits tenderness. She exhibits normal range of motion.       Right knee: She exhibits normal range of motion and no swelling. No tenderness found.       Right upper leg: She exhibits tenderness.       Legs: Neurological: She is alert and oriented to person, place, and time.  Skin: Skin is warm and dry. She is not diaphoretic.  Nursing note and vitals reviewed.    ED Treatments / Results  Labs (all labs ordered are listed, but only abnormal results are displayed) Labs Reviewed - No data to display  EKG  EKG Interpretation None       Radiology Dg Hip Unilat  With Pelvis 2-3 Views Right  Result Date: 07/11/2017 CLINICAL DATA:  Right hip pain for 1 week without known injury. EXAM: DG HIP (WITH OR WITHOUT PELVIS) 2-3V RIGHT COMPARISON:  None. FINDINGS: There is no  evidence of hip fracture or dislocation. There is no evidence of arthropathy or other focal bone abnormality. IMPRESSION: Normal right hip. Electronically Signed   By: Marijo Conception, M.D.   On: 07/11/2017 11:56    Procedures Procedures (including critical care time)  Medications Ordered in ED Medications - No data to display   Initial Impression / Assessment and Plan / ED Course  I have reviewed the triage vital signs and the nursing notes.  Pertinent labs & imaging results that were  available during my care of the patient were reviewed by me and considered in my medical decision making (see chart for details).     No fracture seen on x-ray. She is able to walk although with some difficulty due to the pain. However she is holding onto anything she walks much better. She has a walker at home that she typically doesn't use but I have encouraged her to use this while she is currently having pain. This is most likely bursitis versus muscular strain of her right lateral hip. I doubt occult fracture. Follow-up with orthopedics if not improving. Tylenol and heat for pain. Discussed return precautions.  Final Clinical Impressions(s) / ED Diagnoses   Final diagnoses:  Lateral pain of right hip    New Prescriptions New Prescriptions   No medications on file     Sherwood Gambler, MD 07/11/17 1236

## 2017-07-11 NOTE — ED Triage Notes (Signed)
Right hip pain for a week. No injury. She is able to ambulate but it makes the pain worse.

## 2017-07-11 NOTE — ED Notes (Signed)
Patient transported to X-ray 

## 2017-07-17 ENCOUNTER — Ambulatory Visit (INDEPENDENT_AMBULATORY_CARE_PROVIDER_SITE_OTHER): Payer: Medicare Other | Admitting: Family Medicine

## 2017-07-17 ENCOUNTER — Encounter: Payer: Self-pay | Admitting: Family Medicine

## 2017-07-17 DIAGNOSIS — M25551 Pain in right hip: Secondary | ICD-10-CM | POA: Diagnosis not present

## 2017-07-17 NOTE — Patient Instructions (Signed)
You have trochanteric bursitis, IT band syndrome. Avoid painful activities as much as possible. Ice over area of pain 3-4 times a day for 15 minutes at a time Hip side raise exercise - work your way up to 3 sets of 10 once a day. Stretches - pick 2-3 and hold for 20-30 seconds x 3 - do once or twice a day. Tylenol 500 mg 1-2 tabs three times a day as needed for pain. If not improving, can consider physical therapy and/or steroid injection. Follow up with me in 1 month to 6 weeks if needed if you aren't improving and want to do the injection.

## 2017-07-18 DIAGNOSIS — M25551 Pain in right hip: Secondary | ICD-10-CM | POA: Insufficient documentation

## 2017-07-18 NOTE — Assessment & Plan Note (Signed)
2/2 trochanteric bursitis, proximal IT band syndrome.  Shown home exercises and stretches to do daily.  Icing, tylenol as needed.  Consider physical therapy, injection if not improving.  F/u in 1 month to 6 weeks if not improving.

## 2017-07-18 NOTE — Progress Notes (Signed)
PCP: Thressa Sheller, MD  Subjective:   HPI: Patient is a 83 y.o. female here for right hip pain.  Patient here with her husband. They report patient has had about 10 days of lateral right hip pain. Pain has been sharp, worse with standing and walking. No acute injury or trauma. Has been using warm pad, taking tylenol which have helped. Pain level is 0/10 currently. No skin changes, numbness.  Past Medical History:  Diagnosis Date  . Arthritis   . Breast cancer ([place])   . Chronic diastolic CHF (congestive heart failure) ([place])   . Cognitive impairment   . Hx of echocardiogram    Echo (9/14):  Mild focal basal septal hypertrophy, EF 55-60%, normal wall motion, Gr 1 DD   . Hypercholesteremia   . Hypertension   . Hyponatremia   . Leukocytosis   . NSTEMI (non-ST elevated myocardial infarction) ([place])    in setting of low Na:  Echo 7/22:  EF 60-65%, normal wall motion, mild LVH, grade 1 diast dysfxn, PASP 29.;  Myoview:  EF 85% and no ischemia  . Personal history of  adenomatouscolonic polyps 04/22/2012   2 diminutive adenomas 12/2006 [name])  . Physical deconditioning   . Risk for falls   . SIADH (syndrome of inappropriate ADH production) ([place])   . Urinary retention     Current Outpatient Prescriptions on File Prior to Visit  Medication Sig Dispense Refill  . alendronate (FOSAMAX) 10 MG tablet Take 10 mg by mouth daily before breakfast. Take with a full glass of water on an empty stomach.    Marland Kitchen aspirin 81 MG tablet Take 81 mg by mouth daily.      . Calcium Carbonate-Vitamin D (CALTRATE 600+D) 600-400 MG-UNIT per chew tablet Chew 1 tablet by mouth 2 (two) times daily.     . Cholecalciferol (VITAMIN D3) 1000 units CAPS Take by mouth.    . donepezil (ARICEPT) 10 MG tablet Take 1 tablet (10 mg total) by mouth at bedtime. 90 tablet 3  . fish oil-omega-3 fatty acids 1000 MG capsule Take 1 g by mouth daily.      Marland Kitchen lisinopril (PRINIVIL,ZESTRIL) 5 MG tablet     . memantine (NAMENDA) 5  MG tablet Take 1 tablet twice a day 180 tablet 3  . Multiple Vitamin (MULTIVITAMIN) tablet Take 1 tablet by mouth daily.       No current facility-administered medications on file prior to visit.     Past Surgical History:  Procedure Laterality Date  . BREAST LUMPECTOMY    . CATARACT EXTRACTION W/ INTRAOCULAR LENS  IMPLANT, BILATERAL  2011  . COLONOSCOPY      Allergies  Allergen Reactions  . Ace Inhibitors Other (See Comments)    Hyponatremia  . Other     hmg coa redustase inhibitors - muscle aches  . Statins Other (See Comments)    Muscle aches     Social History   Social History  . Marital status: Married    Spouse name: N/A  . Number of children: 3  . Years of education: N/A   Occupational History  . Retired    Social History Main Topics  . Smoking status: Former Smoker    Types: Cigarettes  . Smokeless tobacco: Never Used     Comment: quit 1969  . Alcohol use 0.0 oz/week     Comment: occ wine  . Drug use: No  . Sexual activity: Not on file   Other Topics Concern  . Not on  file   Social History Narrative  . No narrative on file    Family History  Problem Relation Age of Onset  . Diabetes Father   . Stroke Father   . Hypertension Mother     BP (!) 147/72   Pulse 90   Ht 5\' 4"  (1.626 m)   Wt 155 lb (70.3 kg)   BMI 26.61 kg/m   Review of Systems: See HPI above.     Objective:  Physical Exam:  Gen: NAD, comfortable in exam room  Back/right hip: No gross deformity, scoliosis. TTP right greater trochanter.  No midline or bony TTP.  No other tenderness. FROM. Strength LEs 5/5 all muscle groups but unable to test hip abduction 2/2 pain.   2+ MSRs in patellar and achilles tendons, equal bilaterally. Negative SLRs. Sensation intact to light touch bilaterally. Negative logroll bilateral hips   Assessment & Plan:  1. Right hip pain - 2/2 trochanteric bursitis, proximal IT band syndrome.  Shown home exercises and stretches to do daily.   Icing, tylenol as needed.  Consider physical therapy, injection if not improving.  F/u in 1 month to 6 weeks if not improving.

## 2017-10-09 ENCOUNTER — Encounter: Payer: Self-pay | Admitting: Neurology

## 2017-10-09 ENCOUNTER — Ambulatory Visit: Payer: Medicare Other | Admitting: Neurology

## 2017-10-09 VITALS — BP 164/92 | HR 97 | Ht 63.0 in | Wt 168.0 lb

## 2017-10-09 DIAGNOSIS — F03B0 Unspecified dementia, moderate, without behavioral disturbance, psychotic disturbance, mood disturbance, and anxiety: Secondary | ICD-10-CM

## 2017-10-09 DIAGNOSIS — R251 Tremor, unspecified: Secondary | ICD-10-CM | POA: Diagnosis not present

## 2017-10-09 DIAGNOSIS — F0390 Unspecified dementia without behavioral disturbance: Secondary | ICD-10-CM

## 2017-10-09 MED ORDER — DONEPEZIL HCL 10 MG PO TABS: 10.0000 mg | ORAL_TABLET | Freq: Every day | ORAL | 3 refills | Status: DC

## 2017-10-09 MED ORDER — MEMANTINE HCL 5 MG PO TABS: ORAL_TABLET | ORAL | 3 refills | Status: DC

## 2017-10-09 NOTE — Patient Instructions (Signed)
1. Continue Aricept 10mg  daily and Namenda 5mg  twice a day 2. Continue with additional home care to help with bathing regularly 3. Connect with [facility] to hopefully provide more local resources 4. Follow-up in 6 months, call for any changes  FALL PRECAUTIONS: Be cautious when walking. Scan the area for obstacles that may increase the risk of trips and falls. When getting up in the mornings, sit up at the edge of the bed for a few minutes before getting out of bed. Consider elevating the bed at the head end to avoid drop of blood pressure when getting up. Walk always in a well-lit room (use night lights in the walls). Avoid area rugs or power cords from appliances in the middle of the walkways. Use a walker or a cane if necessary and consider physical therapy for balance exercise. Get your eyesight checked regularly.  FINANCIAL OVERSIGHT: Supervision, especially oversight when making financial decisions or transactions is also recommended.  HOME SAFETY: Consider the safety of the kitchen when operating appliances like stoves, microwave oven, and blender. Consider having supervision and share cooking responsibilities until no longer able to participate in those. Accidents with firearms and other hazards in the house should be identified and addressed as well.  DRIVING: Regarding driving, in patients with progressive memory problems, driving will be impaired. We advise to have someone else do the driving if trouble finding directions or if minor accidents are reported. Independent driving assessment is available to determine safety of driving.  ABILITY TO BE LEFT ALONE: If patient is unable to contact 911 operator, consider using [facility], or when the need is there, arrange for someone to stay with patients. Smoking is a fire hazard, consider supervision or cessation. Risk of wandering should be assessed by caregiver and if detected at any point, supervision and safe proof recommendations should be  instituted.  MEDICATION SUPERVISION: Inability to self-administer medication needs to be constantly addressed. Implement a mechanism to ensure safe administration of the medications.  RECOMMENDATIONS FOR ALL PATIENTS WITH MEMORY PROBLEMS: 1. Continue to exercise (Recommend 30 minutes of walking everyday, or 3 hours every week) 2. Increase social interactions - continue going to [place] and enjoy social gatherings with friends and family 3. Eat healthy, avoid fried foods and eat more fruits and vegetables 4. Maintain adequate blood pressure, blood sugar, and blood cholesterol level. Reducing the risk of stroke and cardiovascular disease also helps promoting better memory. 5. Avoid stressful situations. Live a simple life and avoid aggravations. Organize your time and prepare for the next day in anticipation. 6. Sleep well, avoid any interruptions of sleep and avoid any distractions in the bedroom that may interfere with adequate sleep quality 7. Avoid sugar, avoid sweets as there is a strong link between excessive sugar intake, diabetes, and cognitive impairment We discussed the Mediterranean diet, which has been shown to help patients reduce the risk of progressive memory disorders and reduces cardiovascular risk. This includes eating fish, eat fruits and green leafy vegetables, nuts like almonds and hazelnuts, walnuts, and also use olive oil. Avoid fast foods and fried foods as much as possible. Avoid sweets and sugar as sugar use has been linked to worsening of memory function.  There is always a concern of gradual progression of memory problems. If this is the case, then we may need to adjust level of care according to patient needs. Support, both to the patient and caregiver, should then be put into place.

## 2017-10-09 NOTE — Progress Notes (Signed)
NEUROLOGY FOLLOW UP OFFICE NOTE  FLETCHER RATHBUN [id number]  HISTORY OF PRESENT ILLNESS: I had the pleasure of seeing Carla Robles in follow-up in the neurology clinic on 10/09/2017.  The patient was last seen 6 months ago for moderate dementia and is again accompanied by her husband who helps supplement the history today. On her last visit, MMSE was 22/30. We agreed to add on Namenda to Aricept. She is tolerating medications without side effects. She does not drive. Her husband is in charge of bills and her medications. She reports her memory "okay but not great." Her husband shows a thumbs down sign. She does not need help with dressing, she needs assistance getting into the tub. She does not bathe regularly, she bathes every 2-3 weeks. She smells of urine today, she does not wear Depends but her husband states she needs to. She had hip pain around 6 weeks ago and was diagnosed with bursitis. They have help coming 2 afternoons a week, who tries to help her with baths. No personality changes or hallucinations. She denies  any headaches, dizziness, diplopia, dysarthria, dysphagia, focal numbness/tingling/weakness. No falls. She has a tremor in both hands that do not affect using utensils.  HPI 07/07/15: This is a pleasant 83 yo RH woman with a history of hypertension, hyperlipidemia, breast cancer, NSTEMI, who presented for worsening memory. She states "I knows I have some issues with it," because she would have to ask her husband things every once in a while. Her husband started noticing memory changes a little over a year ago, but it has become more noticeable recently, where friends have started noticing as well. A few months ago, she was supposed to meet her friend at a certain location, but forgot their meeting place and went to a different place. She forgets that she makes appointments with her friends. She continues to drive, but has called him at least 2-3 times that she was turned around and not sure how  to get back. She does have a GPS in her car but did not know what address to put in. Her husband started putting her medications in a pillbox a couple of months ago to make sure she takes them, and they report she overall remembers her medications. She denies any missed bill payments except for 1 notice they got yesterday. She has occasional word-finding difficulties and problems multitasking. No personality changes. She started Aricept a few months ago, and initially reported soft stools, which she denies today. She denies any side effects on Aricept. She has occasional mild bilateral hand tremors. She denies any significant head injuries. Her father had memory problems.   Laboratory Data: 04/2015: CBC, CMP unremarkable. TSH 3.810, B12 356, vitamin D 42.8 I personally reviewed MRI brain without contrast which showed mild to moderate generalized atrophy and chronic microvascular disease, no acute changes.  PAST MEDICAL HISTORY: Past Medical History:  Diagnosis Date  . Arthritis   . Breast cancer ([place])   . Chronic diastolic CHF (congestive heart failure) ([place])   . Cognitive impairment   . Hx of echocardiogram    Echo (9/14):  Mild focal basal septal hypertrophy, EF 55-60%, normal wall motion, Gr 1 DD   . Hypercholesteremia   . Hypertension   . Hyponatremia   . Leukocytosis   . NSTEMI (non-ST elevated myocardial infarction) ([place])    in setting of low Na:  Echo 7/22:  EF 60-65%, normal wall motion, mild LVH, grade 1 diast dysfxn, PASP 29.;  Myoview:  EF 85% and no ischemia  . Personal history of  adenomatouscolonic polyps 04/22/2012   2 diminutive adenomas 12/2006 [name])  . Physical deconditioning   . Risk for falls   . SIADH (syndrome of inappropriate ADH production) ([place])   . Urinary retention     MEDICATIONS: Current Outpatient Medications on File Prior to Visit  Medication Sig Dispense Refill  . alendronate (FOSAMAX) 10 MG tablet Take 10 mg by mouth daily before breakfast. Take with  a full glass of water on an empty stomach.    Marland Kitchen aspirin 81 MG tablet Take 81 mg by mouth daily.      . Calcium Carbonate-Vitamin D (CALTRATE 600+D) 600-400 MG-UNIT per chew tablet Chew 1 tablet by mouth 2 (two) times daily.     . Cholecalciferol (VITAMIN D3) 1000 units CAPS Take by mouth.    . donepezil (ARICEPT) 10 MG tablet Take 1 tablet (10 mg total) by mouth at bedtime. 90 tablet 3  . fish oil-omega-3 fatty acids 1000 MG capsule Take 1 g by mouth daily.      Marland Kitchen lisinopril (PRINIVIL,ZESTRIL) 5 MG tablet     . memantine (NAMENDA) 5 MG tablet Take 1 tablet twice a day 180 tablet 3  . Multiple Vitamin (MULTIVITAMIN) tablet Take 1 tablet by mouth daily.       No current facility-administered medications on file prior to visit.     ALLERGIES: Allergies  Allergen Reactions  . Ace Inhibitors Other (See Comments)    Hyponatremia  . Other     hmg coa redustase inhibitors - muscle aches  . Statins Other (See Comments)    Muscle aches     FAMILY HISTORY: Family History  Problem Relation Age of Onset  . Diabetes Father   . Stroke Father   . Hypertension Mother     SOCIAL HISTORY: Social History   Socioeconomic History  . Marital status: Married    Spouse name: Not on file  . Number of children: 3  . Years of education: Not on file  . Highest education level: Not on file  Social Needs  . Financial resource strain: Not on file  . Food insecurity - worry: Not on file  . Food insecurity - inability: Not on file  . Transportation needs - medical: Not on file  . Transportation needs - non-medical: Not on file  Occupational History  . Occupation: Retired  Tobacco Use  . Smoking status: Former Smoker    Types: Cigarettes  . Smokeless tobacco: Never Used  . Tobacco comment: quit 1969  Substance and Sexual Activity  . Alcohol use: Yes    Alcohol/week: 0.0 oz    Comment: occ wine  . Drug use: No  . Sexual activity: Not on file  Other Topics Concern  . Not on file  Social  History Narrative  . Not on file    REVIEW OF SYSTEMS: Constitutional: No fevers, chills, or sweats, no generalized fatigue, change in appetite Eyes: No visual changes, double vision, eye pain Ear, nose and throat: No hearing loss, ear pain, nasal congestion, sore throat Cardiovascular: No chest pain, palpitations Respiratory:  No shortness of breath at rest or with exertion, wheezes GastrointestinaI: No nausea, vomiting, diarrhea, abdominal pain, fecal incontinence Genitourinary:  No dysuria, urinary retention or frequency Musculoskeletal:  No neck pain,+ back pain Integumentary: No rash, pruritus, skin lesions Neurological: as above Psychiatric: No depression, insomnia, anxiety Endocrine: No palpitations, fatigue, diaphoresis, mood swings, change in appetite, change in weight, increased thirst  Hematologic/Lymphatic:  No anemia, purpura, petechiae. Allergic/Immunologic: no itchy/runny eyes, nasal congestion, recent allergic reactions, rashes  PHYSICAL EXAM: Vitals:   10/09/17 0845  BP: (!) 164/92  Pulse: 97  SpO2: 95%   General: No acute distress Head:  Normocephalic/atraumatic Neck: supple, no paraspinal tenderness, full range of motion Heart:  Regular rate and rhythm Lungs:  Clear to auscultation bilaterally Back: No paraspinal tenderness Skin/Extremities: No rash, no edema Neurological Exam: alert and oriented to person, place, season. She initially could not remember city and month, but with prompting could later on say. No aphasia or dysarthria. Fund of knowledge is appropriate.  Recent and remote memory are impaired. 0/3 delayed recall. Attention and concentration are normal.    Able to name objects and repeat phrases.Cranial nerves: Pupils equal, round, reactive to light.  Extraocular movements intact with no nystagmus. Visual fields full. Facial sensation intact. No facial asymmetry. Tongue, uvula, palate midline.  Motor: Bulk and tone normal, no cogwheeling, muscle  strength 5/5 throughout with no pronator drift.  Sensation to light touch intact.  No extinction to double simultaneous stimulation.  Deep tendon reflexes +1 throughout, toes downgoing.  Finger to nose testing intact.  Gait slow and unsteady leaning to the right (since bursitis per husband). Romberg negative. +resting tremor in both hands, +endpoint>postural tremor in both UE. No postural instability.   IMPRESSION: This is a pleasant 83 yo RH woman with a history of hypertension, hyperlipidemia, NSTEMI, breast cancer, with moderate dementia. MRI brain did not show any acute changes. She is taking Aricept 10mg  daily and Namenda 5mg  BID with no side effects. She is needing more assistance at home, we discussed getting more regular help to assist with bathing. They will be connected with [facility] through the [employer]. She is not driving. She has a tremor today but no clear signs of parkinsonism, continue to monitor. We again discussed the importance of physical exercise and brain stimulation exercises for brain health. She will follow-up in 6 months.   Thank you for allowing me to participate in her care.  Please do not hesitate to call for any questions or concerns.  The duration of this appointment visit was 25 minutes of face-to-face time with the patient.  Greater than 50% of this time was spent in counseling, explanation of diagnosis, planning of further management, and coordination of care.   Ellouise Newer, M.D.   CC: Dr. Noah Delaine

## 2017-10-10 ENCOUNTER — Ambulatory Visit: Payer: Medicare Other | Admitting: Neurology

## 2017-10-18 ENCOUNTER — Ambulatory Visit: Payer: Medicare Other | Admitting: Neurology

## 2017-11-22 ENCOUNTER — Emergency Department (HOSPITAL_BASED_OUTPATIENT_CLINIC_OR_DEPARTMENT_OTHER): Payer: Medicare Other

## 2017-11-22 ENCOUNTER — Emergency Department (HOSPITAL_BASED_OUTPATIENT_CLINIC_OR_DEPARTMENT_OTHER)
Admission: EM | Admit: 2017-11-22 | Discharge: 2017-11-22 | Disposition: A | Discharge status: Home / Self Care | Payer: Medicare Other | Attending: Emergency Medicine | Admitting: Emergency Medicine

## 2017-11-22 ENCOUNTER — Encounter (HOSPITAL_BASED_OUTPATIENT_CLINIC_OR_DEPARTMENT_OTHER): Payer: Self-pay

## 2017-11-22 ENCOUNTER — Other Ambulatory Visit: Payer: Self-pay

## 2017-11-22 DIAGNOSIS — F0390 Unspecified dementia without behavioral disturbance: Secondary | ICD-10-CM | POA: Diagnosis not present

## 2017-11-22 DIAGNOSIS — I252 Old myocardial infarction: Secondary | ICD-10-CM | POA: Diagnosis not present

## 2017-11-22 DIAGNOSIS — Z853 Personal history of malignant neoplasm of breast: Secondary | ICD-10-CM | POA: Diagnosis not present

## 2017-11-22 DIAGNOSIS — W19XXXA Unspecified fall, initial encounter: Secondary | ICD-10-CM | POA: Insufficient documentation

## 2017-11-22 DIAGNOSIS — R531 Weakness: Secondary | ICD-10-CM | POA: Insufficient documentation

## 2017-11-22 DIAGNOSIS — Z7982 Long term (current) use of aspirin: Secondary | ICD-10-CM | POA: Insufficient documentation

## 2017-11-22 DIAGNOSIS — I110 Hypertensive heart disease with heart failure: Secondary | ICD-10-CM | POA: Insufficient documentation

## 2017-11-22 DIAGNOSIS — Z87891 Personal history of nicotine dependence: Secondary | ICD-10-CM | POA: Insufficient documentation

## 2017-11-22 DIAGNOSIS — Z79899 Other long term (current) drug therapy: Secondary | ICD-10-CM | POA: Diagnosis not present

## 2017-11-22 DIAGNOSIS — I5032 Chronic diastolic (congestive) heart failure: Secondary | ICD-10-CM | POA: Insufficient documentation

## 2017-11-22 LAB — COMPREHENSIVE METABOLIC PANEL
ALBUMIN: 3.7 g/dL (ref 3.5–5.0)
ALT: 21 U/L (ref 14–54)
AST: 27 U/L (ref 15–41)
Alkaline Phosphatase: 98 U/L (ref 38–126)
Anion gap: 10 (ref 5–15)
BUN: 25 mg/dL — AB (ref 6–20)
CHLORIDE: 100 mmol/L — AB (ref 101–111)
CO2: 26 mmol/L (ref 22–32)
Calcium: 9.4 mg/dL (ref 8.9–10.3)
Creatinine, Ser: 1.1 mg/dL — ABNORMAL HIGH (ref 0.44–1.00)
GFR calc Af Amer: 53 mL/min — ABNORMAL LOW (ref >60)
GFR, EST NON AFRICAN AMERICAN: 46 mL/min — AB (ref >60)
GLUCOSE: 148 mg/dL — AB (ref 65–99)
POTASSIUM: 4.2 mmol/L (ref 3.5–5.1)
SODIUM: 136 mmol/L (ref 135–145)
Total Bilirubin: 0.4 mg/dL (ref 0.3–1.2)
Total Protein: 7.6 g/dL (ref 6.5–8.1)

## 2017-11-22 LAB — URINALYSIS, ROUTINE W REFLEX MICROSCOPIC
Bilirubin Urine: NEGATIVE
Glucose, UA: NEGATIVE mg/dL
Ketones, ur: NEGATIVE mg/dL
Leukocytes, UA: NEGATIVE
Nitrite: NEGATIVE
PH: 7 (ref 5.0–8.0)
Protein, ur: NEGATIVE mg/dL
SPECIFIC GRAVITY, URINE: 1.02 (ref 1.005–1.030)

## 2017-11-22 LAB — CBC WITH DIFFERENTIAL/PLATELET
BASOS ABS: 0 10*3/uL (ref 0.0–0.1)
BASOS PCT: 0 %
EOS ABS: 0 10*3/uL (ref 0.0–0.7)
EOS PCT: 0 %
HCT: 41.2 % (ref 36.0–46.0)
Hemoglobin: 13.8 g/dL (ref 12.0–15.0)
Lymphocytes Relative: 9 %
Lymphs Abs: 1.2 10*3/uL (ref 0.7–4.0)
MCH: 31.5 pg (ref 26.0–34.0)
MCHC: 33.5 g/dL (ref 30.0–36.0)
MCV: 94.1 fL (ref 78.0–100.0)
MONO ABS: 0.9 10*3/uL (ref 0.1–1.0)
Monocytes Relative: 8 %
Neutro Abs: 10.2 10*3/uL — ABNORMAL HIGH (ref 1.7–7.7)
Neutrophils Relative %: 83 %
PLATELETS: 280 10*3/uL (ref 150–400)
RBC: 4.38 MIL/uL (ref 3.87–5.11)
RDW: 14 % (ref 11.5–15.5)
WBC: 12.4 10*3/uL — AB (ref 4.0–10.5)

## 2017-11-22 LAB — URINALYSIS, MICROSCOPIC (REFLEX): WBC, UA: NONE SEEN WBC/hpf (ref 0–5)

## 2017-11-22 NOTE — ED Provider Notes (Signed)
[facility] EMERGENCY DEPARTMENT Provider Note   CSN: [id number] Arrival date & time: 11/22/17  1652     History   Chief Complaint Chief Complaint  Patient presents with  . Fall    HPI Carla Robles is a 83 y.o. female.  HPI   Recently has become less mobile, husband is primary care giver. Went into the bathroom on Wednesday, and when he opened the door she was laying on the floor. She had been on toilet, speaking to him, saying she wsn't ready, and then said, she could use his help and when he opened the door she was on the floor. Does not suspect significant fall, possible fall off of toilet.   After this couldn't stand up with the walker, has had difficulty getting up or standing. Today the home health provider noted she had change in ambulation.  Couldn't stand without support.  Normally walks with walker independently. No facial droop, diff speaking. Not clear if focal weakness, does seem to be dragging right leg more. Had been shuffling with both legs prior to fall.  No fevers. Has had cough for last few days and has been giving delsym.  Noted symptoms changed after this. No chest pain or dyspnea. Has been using depends over last month, more frequent urination recently.    Past Medical History:  Diagnosis Date  . Arthritis   . Breast cancer ([place])   . Chronic diastolic CHF (congestive heart failure) ([place])   . Cognitive impairment   . Hx of echocardiogram    Echo (9/14):  Mild focal basal septal hypertrophy, EF 55-60%, normal wall motion, Gr 1 DD   . Hypercholesteremia   . Hypertension   . Hyponatremia   . Leukocytosis   . NSTEMI (non-ST elevated myocardial infarction) ([place])    in setting of low Na:  Echo 7/22:  EF 60-65%, normal wall motion, mild LVH, grade 1 diast dysfxn, PASP 29.;  Myoview:  EF 85% and no ischemia  . Personal history of  adenomatouscolonic polyps 04/22/2012   2 diminutive adenomas 12/2006 [name])  . Physical deconditioning   . Risk for falls     . SIADH (syndrome of inappropriate ADH production) ([place])   . Urinary retention     Patient Active Problem List   Diagnosis Date Noted  . Right hip pain 07/18/2017  . Moderate dementia, without behavioral disturbance 10/16/2016  . Urinary tract infection, site not specified 05/05/2016  . Pulmonary vascular congestion 05/05/2016  . SIADH (syndrome of inappropriate ADH production) ([place]) 05/05/2016  . Hypercholesteremia 05/05/2016  . Hyponatremia 05/04/2016  . Mild cognitive impairment 07/07/2015  . Personal history of  adenomatouscolonic polyps 04/22/2012  . Cardiac enzymes elevated 09/07/2011  . Essential hypertension 07/13/2011  . Hyperlipidemia 07/13/2011    Past Surgical History:  Procedure Laterality Date  . BREAST LUMPECTOMY    . CATARACT EXTRACTION W/ INTRAOCULAR LENS  IMPLANT, BILATERAL  2011  . COLONOSCOPY      OB History    No data available       Home Medications    Prior to Admission medications   Medication Sig Start Date End Date Taking? Authorizing Provider  alendronate (FOSAMAX) 10 MG tablet Take 10 mg by mouth daily before breakfast. Take with a full glass of water on an empty stomach.    [provider]  aspirin 81 MG tablet Take 81 mg by mouth daily.      [provider]  Calcium Carbonate-Vitamin D (CALTRATE 600+D) 600-400  MG-UNIT per chew tablet Chew 1 tablet by mouth 2 (two) times daily.     [provider]  Cholecalciferol (VITAMIN D3) 1000 units CAPS Take by mouth.    [provider]  donepezil (ARICEPT) 10 MG tablet Take 1 tablet (10 mg total) at bedtime by mouth. 10/09/17   Cameron Sprang, MD  fish oil-omega-3 fatty acids 1000 MG capsule Take 1 g by mouth daily.      [provider]  lisinopril (PRINIVIL,ZESTRIL) 5 MG tablet  09/19/16   [provider]  memantine (NAMENDA) 5 MG tablet Take 1 tablet twice a day 10/09/17   Cameron Sprang, MD  Multiple Vitamin (MULTIVITAMIN) tablet Take 1 tablet  by mouth daily.      [provider]    Family History Family History  Problem Relation Age of Onset  . Diabetes Father   . Stroke Father   . Hypertension Mother     Social History Social History   Tobacco Use  . Smoking status: Former Smoker    Types: Cigarettes  . Smokeless tobacco: Never Used  . Tobacco comment: quit 1969  Substance Use Topics  . Alcohol use: Yes    Alcohol/week: 0.0 oz    Comment: occ wine  . Drug use: No     Allergies   Ace inhibitors; Other; and Statins   Review of Systems Review of Systems  Constitutional: Negative for fever.  HENT: Negative for sore throat.   Eyes: Negative for visual disturbance.  Respiratory: Positive for cough. Negative for shortness of breath.   Cardiovascular: Negative for chest pain.  Gastrointestinal: Negative for abdominal pain, nausea and vomiting.  Genitourinary: Positive for frequency. Negative for difficulty urinating.  Musculoskeletal: Positive for gait problem. Negative for back pain and neck pain.  Skin: Negative for rash.  Neurological: Negative for syncope, speech difficulty, numbness and headaches.     Physical Exam Updated Vital Signs BP (!) 172/86   Pulse 78   Temp 98 F (36.7 C) (Oral)   Resp 14   Ht 5\' 2"  (1.575 m)   Wt 76.2 kg (168 lb)   SpO2 94%   BMI 30.73 kg/m   Physical Exam  Constitutional: She is oriented to person, place, and time. She appears well-developed and well-nourished. No distress.  HENT:  Head: Normocephalic and atraumatic.  Eyes: Conjunctivae and EOM are normal.  Neck: Normal range of motion.  Cardiovascular: Normal rate, regular rhythm, normal heart sounds and intact distal pulses. Exam reveals no gallop and no friction rub.  No murmur heard. Pulmonary/Chest: Effort normal and breath sounds normal. No respiratory distress. She has no wheezes. She has no rales.  Abdominal: Soft. She exhibits no distension. There is no tenderness. There is no guarding.   Musculoskeletal: She exhibits no edema or tenderness.  Neurological: She is alert and oriented to person, place, and time. She has normal strength. No cranial nerve deficit or sensory deficit. Coordination and gait (slow gait, shuffling, no ataxia, stands and walks independently but appears unsteady) normal. GCS eye subscore is 4. GCS verbal subscore is 5. GCS motor subscore is 6.  Skin: Skin is warm and dry. No rash noted. She is not diaphoretic. No erythema.  Nursing note and vitals reviewed.    ED Treatments / Results  Labs (all labs ordered are listed, but only abnormal results are displayed) Labs Reviewed  CBC WITH DIFFERENTIAL/PLATELET - Abnormal; Notable for the following components:      Result Value   WBC  12.4 (*)    Neutro Abs 10.2 (*)    All other components within normal limits  COMPREHENSIVE METABOLIC PANEL - Abnormal; Notable for the following components:   Chloride 100 (*)    Glucose, Bld 148 (*)    BUN 25 (*)    Creatinine, Ser 1.10 (*)    GFR calc non Af Amer 46 (*)    GFR calc Af Amer 53 (*)    All other components within normal limits  URINALYSIS, ROUTINE W REFLEX MICROSCOPIC - Abnormal; Notable for the following components:   Hgb urine dipstick SMALL (*)    All other components within normal limits  URINALYSIS, MICROSCOPIC (REFLEX) - Abnormal; Notable for the following components:   Bacteria, UA RARE (*)    Squamous Epithelial / LPF 0-5 (*)    All other components within normal limits  URINE CULTURE    EKG  EKG Interpretation  Date/Time:  Friday November 22 2017 22:26:56 EST Ventricular Rate:  76 PR Interval:    QRS Duration: 140 QT Interval:  438 QTC Calculation: 493 R Axis:   28 Text Interpretation:  Sinus rhythm Left bundle branch block Since prior ECG in 2014, patient now has a left bundle branch block Confirmed by Gareth Morgan [phone]) on 11/22/2017 10:55:54 PM       Radiology Dg Chest 2 View  Result Date: 11/22/2017 CLINICAL DATA:   Status post fall off toilet 2 days ago, with concern for chest injury. Generalized weakness and cough. EXAM: CHEST  2 VIEW COMPARISON:  Chest radiograph performed 05/06/2016 FINDINGS: The lungs are well-aerated. Biapical pleural thickening is noted. There is no evidence of significant pleural effusion or pneumothorax. The heart is normal in size; the mediastinal contour is within normal limits. No acute osseous abnormalities are seen. Postoperative change is noted at the left chest wall. IMPRESSION: Biapical pleural thickening.  Lungs otherwise grossly clear. Electronically Signed   By: Garald Balding M.D.   On: 11/22/2017 22:15   Ct Head Wo Contrast  Result Date: 11/22/2017 CLINICAL DATA:  Status post fall off toilet 2 days ago, with shuffling gait. Unable to lift feet. EXAM: CT HEAD WITHOUT CONTRAST TECHNIQUE: Contiguous axial images were obtained from the base of the skull through the vertex without intravenous contrast. COMPARISON:  CT of the head performed 05/04/2016 FINDINGS: Brain: No evidence of acute infarction, hemorrhage, hydrocephalus, extra-axial collection or mass lesion/mass effect. Prominence of the ventricles and sulci reflects moderate cortical volume loss. Cerebellar atrophy is noted. Scattered periventricular and subcortical white matter change likely reflects small vessel ischemic microangiopathy. Chronic ischemic change is noted at the external capsule bilaterally. The brainstem and fourth ventricle are within normal limits. The cerebral hemispheres demonstrate grossly normal gray-white differentiation. No mass effect or midline shift is seen. Vascular: No hyperdense vessel or unexpected calcification. Skull: There is no evidence of fracture; visualized osseous structures are unremarkable in appearance. Sinuses/Orbits: The orbits are within normal limits. There is partial opacification of the right maxillary sinus. The remaining paranasal sinuses and mastoid air cells are well-aerated.  Other: No significant soft tissue abnormalities are seen. IMPRESSION: 1. No evidence of traumatic intracranial injury or fracture. 2. Moderate cortical volume loss and scattered small vessel ischemic microangiopathy. 3. Chronic ischemic change at the external capsule bilaterally. 4. Partial opacification of the right maxillary sinus. Electronically Signed   By: Garald Balding M.D.   On: 11/22/2017 22:17    Procedures Procedures (including critical care time)  Medications Ordered in ED Medications - No  data to display   Initial Impression / Assessment and Plan / ED Course  I have reviewed the triage vital signs and the nursing notes.  Pertinent labs & imaging results that were available during my care of the patient were reviewed by me and considered in my medical decision making (see chart for details).    83yo female with history above presents with concern for difficulty ambulating over the last few days.  She has a nonfocal neurologic exam, normal coordination, gait without ataxia, low suspicion for acute CVA by history.  CXR shows no pneumonia. UA without infection. EKG shows LBBB, however last EKG was 4 years ago. No chest pain or dyspnea and doubt ACS. Head CT without abnormalities. No neck or back tenderness on exam.  Mild elevation in Cr, likely some dehydration, recommend PCP follow up.   Patient with cough, possible viral etiology of worsening/generalized weakness. Symptoms may also be secondary to advancing dementia.  Placed home health orders for PT/OT/RN/social work.  Stable for discharge and close outpatient follow up. Patient discharged in stable condition with understanding of reasons to return.     Final Clinical Impressions(s) / ED Diagnoses   Final diagnoses:  Fall, initial encounter  Increased weakness when ambulating    ED Discharge Orders        [place]     11/22/17 2315    Face-to-face encounter (required for Medicare/Medicaid patients)      Comments:  I Tennis Must certify that this patient is under my care and that I, or a nurse practitioner or physician's assistant working with me, had a face-to-face encounter that meets the physician face-to-face encounter requirements with this patient on 11/22/2017. The encounter with the patient was in whole, or in part for the following medical condition(s) which is the primary reason for home health care (List medical condition): dementia, falls, difficult ambulating   11/22/17 2315       Gareth Morgan, MD 11/23/17 [phone]

## 2017-11-22 NOTE — ED Notes (Signed)
Pt and family understood dc material. NAD noted 

## 2017-11-22 NOTE — ED Triage Notes (Addendum)
Per husband pt fell off toilet 2 days ago-since then pt with shuffling gait and unable to lift feet prior to fall-denies head injury or LOC-pt alert-denies pain-NAD-hx of dementia

## 2017-11-22 NOTE — ED Notes (Signed)
ED Provider at bedside. 

## 2017-11-24 LAB — URINE CULTURE: CULTURE: NO GROWTH

## 2018-01-01 ENCOUNTER — Encounter: Payer: Self-pay | Admitting: Family Medicine

## 2018-01-01 ENCOUNTER — Ambulatory Visit: Payer: Medicare Other | Admitting: Family Medicine

## 2018-01-01 VITALS — BP 124/76 | HR 86 | Temp 97.6°F | Resp 12 | Ht 62.0 in | Wt 171.0 lb

## 2018-01-01 DIAGNOSIS — I10 Essential (primary) hypertension: Secondary | ICD-10-CM | POA: Diagnosis not present

## 2018-01-01 DIAGNOSIS — E7800 Pure hypercholesterolemia, unspecified: Secondary | ICD-10-CM

## 2018-01-01 DIAGNOSIS — R296 Repeated falls: Secondary | ICD-10-CM | POA: Diagnosis not present

## 2018-01-01 DIAGNOSIS — E222 Syndrome of inappropriate secretion of antidiuretic hormone: Secondary | ICD-10-CM | POA: Diagnosis not present

## 2018-01-01 DIAGNOSIS — F03B0 Unspecified dementia, moderate, without behavioral disturbance, psychotic disturbance, mood disturbance, and anxiety: Secondary | ICD-10-CM

## 2018-01-01 DIAGNOSIS — F0390 Unspecified dementia without behavioral disturbance: Secondary | ICD-10-CM

## 2018-01-01 DIAGNOSIS — I447 Left bundle-branch block, unspecified: Secondary | ICD-10-CM | POA: Diagnosis not present

## 2018-01-01 NOTE — Progress Notes (Signed)
HPI:   [name] is a 84 y.o. female, who is here today with her husband to establish care with me.  Former PCP: Dr Noah Delaine Last preventive routine visit: 03/2017.  Her husband provides Hx due to her Hx.  Chronic medical problems: SIADH , OA, hypoNa++ as low as 109 and in 2017 it was 126. HTN,NSTEMI, and breast cancer s/p lumpectomy.  HTN on Lisinopril 5 mg daily. Dx many years ago.  Negative for headache, visual changes, chest pain, dyspnea, palpitation, focal weakness, or edema.    Lab Results  Component Value Date   CREATININE 1.10 (H) 11/22/2017   BUN 25 (H) 11/22/2017   NA 136 11/22/2017   K 4.2 11/22/2017   CL 100 (L) 11/22/2017   CO2 26 11/22/2017   Alzheimer's disease: She follows with neuro, Dr Werner Lean 3 months. She is on Aricept and Namenda.  She lives with her husband. She needs 24/7 care. She has a CMA through [facility] to help her with ADL's: 4 hours Tuesday and 2 hours Fridays.  She needs assistance with bathing,transfer,dressing, and grooming.  Concerns today: Frequent falls.  Unstable gait, she has a walker at home and a wheel chair. Last serious fall was on 11/21/17, she was in the bathroom, husband found her on the floor unable to get up. No apparent head trauma or LOC,no MS changes.  She was evaluated in the ER on 11/22/17 because worsening right hip pain. In the past she has seen ortho for right hip pain.  Right hip X ray negative for fracture. Head CT no acute intracranial abnormality.   Osteopenia, Fosamax was recommended but never started. Her husband decided not to start it after reading instructions, he was afraid of side effects. She is on Ca++ and Vit D supplementation.    Her husband is also concerned because EKG findings during ER visit in 11/2017. EKG showed LBBB.  She was following with cardiologist until a couple years ago.  She has not c/o chest pain,dyspnea,or palpitations. No syncopal  episodes.  Echo in 05/2016 LVEF 65-70% and grade 2 diastolic dysfunction.   Review of Systems  Constitutional: Positive for fatigue. Negative for activity change, appetite change and fever.  HENT: Negative for mouth sores, nosebleeds and trouble swallowing.   Eyes: Negative for redness and visual disturbance.  Respiratory: Negative for cough, shortness of breath and wheezing.   Cardiovascular: Negative for chest pain, palpitations and leg swelling.  Gastrointestinal: Negative for abdominal pain, nausea and vomiting.       Negative for changes in bowel habits.  Endocrine: Negative for cold intolerance, heat intolerance, polydipsia, polyphagia and polyuria.  Genitourinary: Negative for decreased urine volume, dysuria and hematuria.  Musculoskeletal: Positive for arthralgias and gait problem.  Skin: Negative for rash and wound.  Neurological: Negative for syncope, weakness and headaches.  Psychiatric/Behavioral: The patient is not nervous/anxious.       Current Outpatient Medications on File Prior to Visit  Medication Sig Dispense Refill  . aspirin 81 MG tablet Take 81 mg by mouth daily.      . Calcium Carbonate-Vitamin D (CALTRATE 600+D) 600-400 MG-UNIT per chew tablet Chew 1 tablet by mouth 2 (two) times daily.     . Cholecalciferol (VITAMIN D3) 1000 units CAPS Take by mouth.    . donepezil (ARICEPT) 10 MG tablet Take 1 tablet (10 mg total) at bedtime by mouth. 90 tablet 3  . fish oil-omega-3 fatty acids 1000 MG capsule Take 1  g by mouth daily.      Marland Kitchen lisinopril (PRINIVIL,ZESTRIL) 5 MG tablet     . memantine (NAMENDA) 5 MG tablet Take 1 tablet twice a day 180 tablet 3  . Multiple Vitamin (MULTIVITAMIN) tablet Take 1 tablet by mouth daily.      Marland Kitchen alendronate (FOSAMAX) 10 MG tablet Take 10 mg by mouth daily before breakfast. Take with a full glass of water on an empty stomach.     No current facility-administered medications on file prior to visit.      Past Medical History:    Diagnosis Date  . Arthritis   . Breast cancer ([place])   . Chronic diastolic CHF (congestive heart failure) ([place])   . Cognitive impairment   . Hx of echocardiogram    Echo (9/14):  Mild focal basal septal hypertrophy, EF 55-60%, normal wall motion, Gr 1 DD   . Hypercholesteremia   . Hypertension   . Hyponatremia   . Leukocytosis   . NSTEMI (non-ST elevated myocardial infarction) ([place])    in setting of low Na:  Echo 7/22:  EF 60-65%, normal wall motion, mild LVH, grade 1 diast dysfxn, PASP 29.;  Myoview:  EF 85% and no ischemia  . Personal history of  adenomatouscolonic polyps 04/22/2012   2 diminutive adenomas 12/2006 [name])  . Physical deconditioning   . Risk for falls   . SIADH (syndrome of inappropriate ADH production) ([place])   . Urinary retention    Allergies  Allergen Reactions  . Ace Inhibitors Other (See Comments)    Hyponatremia  . Other     hmg coa redustase inhibitors - muscle aches  . Statins Other (See Comments)    Muscle aches     Family History  Problem Relation Age of Onset  . Diabetes Father   . Stroke Father   . Hypertension Mother     Social History   Socioeconomic History  . Marital status: Married    Spouse name: None  . Number of children: 3  . Years of education: None  . Highest education level: None  Social Needs  . Financial resource strain: None  . Food insecurity - worry: None  . Food insecurity - inability: None  . Transportation needs - medical: None  . Transportation needs - non-medical: None  Occupational History  . Occupation: Retired  Tobacco Use  . Smoking status: Former Smoker    Types: Cigarettes  . Smokeless tobacco: Never Used  . Tobacco comment: quit 1969  Substance and Sexual Activity  . Alcohol use: Yes    Alcohol/week: 0.0 oz    Comment: occ wine  . Drug use: No  . Sexual activity: No  Other Topics Concern  . None  Social History Narrative  . None    Vitals:   01/01/18 1408  BP: 124/76  Pulse: 86  Resp:  12  Temp: 97.6 F (36.4 C)  SpO2: 94%    Body mass index is 31.28 kg/m.   Physical Exam  Nursing note and vitals reviewed. Constitutional: She appears well-developed. She is cooperative. No distress.  HENT:  Head: Normocephalic and atraumatic.  Mouth/Throat: Oropharynx is clear and moist and mucous membranes are normal.  Eyes: Conjunctivae are normal. Pupils are equal, round, and reactive to light.  Cardiovascular: Normal rate and regular rhythm.  No murmur heard. DP pulses present bilateral.  Respiratory: Effort normal and breath sounds normal. No respiratory distress.  GI: Soft. She exhibits no mass. There is no tenderness.  Musculoskeletal:  She exhibits no edema or tenderness.  Lymphadenopathy:    She has no cervical adenopathy.  Neurological: She is alert. She has normal strength. Coordination normal.  She is in a wheel chair.  Skin: Skin is warm. No rash noted. No erythema.  Psychiatric: She has a normal mood and affect.  Fairly groomed, good eye contact.    ASSESSMENT AND PLAN:   Ms. Buna was seen today for establish care.  Diagnoses and all orders for this visit:  Frequent falls  Fall precautions discussed, whish is difficult given her Hx of alzheimer's. She frequently forgets she has unstable gait and gets up fast or does not use walker. PT will be arranged.  -     Ambulatory referral to Home Health  Moderate dementia, without behavioral disturbance  Continue current management. Continue following with Dr Delice Lesch.  -     Ambulatory referral to [facility] non pharmacologic treatment. Last FLP in 03/2017: TC 231,HDL 84,LDL 116,and TG 155.  SIADH (syndrome of inappropriate ADH production) ([place])  Last Na++ in 11/2017 was in normal range. Side effects of Lisinopril discussed. Continue fluid restrictions.  LBBB (left bundle branch block)  Asymptomatic. She has had abnormal EKG's in the past but no LBBB. In 2014  evaluated by cardiologist because ST and T waves abnormalities. Echo in 4782 grade 2 diastolic dysfunction. I do not recommend further work up at this time, instructed husband to monitor for symptoms.  Essential hypertension  Adequately controlled. No changes in current management. DASH-low salt diet to continue. Eye exam recommended annually.     At the time of this visit I do not have records from former PCP. For now no changes in Aspirin 81 mg, side effects discussed. Since she follows with Dr Delice Lesch q 3 months,I think I can see her annually,before if needed and depending of findings on records.      Betty G. Martinique, MD  [facility]. [place] office.

## 2018-01-01 NOTE — Patient Instructions (Signed)
A few things to remember from today's visit:   Moderate dementia, without behavioral disturbance - Plan: Ambulatory referral to [facility]  SIADH (syndrome of inappropriate ADH production) ([place])  Pulmonary vascular congestion  Frequent falls - Plan: Ambulatory referral to [facility]  I will see you back in a year,before if needed.   Please be sure medication list is accurate. If a new problem present, please set up appointment sooner than planned today.

## 2018-03-31 ENCOUNTER — Other Ambulatory Visit: Payer: Self-pay

## 2018-03-31 DIAGNOSIS — F0390 Unspecified dementia without behavioral disturbance: Secondary | ICD-10-CM

## 2018-03-31 DIAGNOSIS — F03B0 Unspecified dementia, moderate, without behavioral disturbance, psychotic disturbance, mood disturbance, and anxiety: Secondary | ICD-10-CM

## 2018-03-31 MED ORDER — DONEPEZIL HCL 10 MG PO TABS: 10.0000 mg | ORAL_TABLET | Freq: Every day | ORAL | 3 refills | Status: DC

## 2018-03-31 MED ORDER — MEMANTINE HCL 5 MG PO TABS: ORAL_TABLET | ORAL | 3 refills | Status: DC

## 2018-04-11 ENCOUNTER — Ambulatory Visit: Payer: Medicare Other | Admitting: Neurology

## 2018-06-12 ENCOUNTER — Telehealth: Payer: Self-pay | Admitting: *Deleted

## 2018-06-12 NOTE — Telephone Encounter (Signed)
Copied from [place] [id number]. Topic: Referral - Request >> Jun 12, 2018  2:31 PM Hewitt Shorts wrote: Pt husband is calling to see if home health physical therapy for Carla Robles due to her dementia and non activity   Best number [phone] Roy-Husband

## 2018-06-13 ENCOUNTER — Other Ambulatory Visit: Payer: Self-pay | Admitting: Family Medicine

## 2018-06-13 DIAGNOSIS — R2681 Unsteadiness on feet: Secondary | ICD-10-CM

## 2018-06-13 NOTE — Telephone Encounter (Signed)
Message sent to Dr. Jordan for review and approval. 

## 2018-06-13 NOTE — Telephone Encounter (Signed)
We need an indication that her health insurance will accept. I do not think dementia and "no activity " will be accepted. I am going to use unstable gait and risk for falls as Dx. Referral placed. Thanks, BJ

## 2018-06-14 ENCOUNTER — Emergency Department (HOSPITAL_COMMUNITY): Payer: Medicare Other

## 2018-06-14 ENCOUNTER — Emergency Department (HOSPITAL_COMMUNITY)
Admission: EM | Admit: 2018-06-14 | Discharge: 2018-06-14 | Disposition: A | Discharge status: Home / Self Care | Payer: Medicare Other | Attending: Emergency Medicine | Admitting: Emergency Medicine

## 2018-06-14 DIAGNOSIS — I5042 Chronic combined systolic (congestive) and diastolic (congestive) heart failure: Secondary | ICD-10-CM | POA: Diagnosis not present

## 2018-06-14 DIAGNOSIS — W050XXA Fall from non-moving wheelchair, initial encounter: Secondary | ICD-10-CM | POA: Insufficient documentation

## 2018-06-14 DIAGNOSIS — Z853 Personal history of malignant neoplasm of breast: Secondary | ICD-10-CM | POA: Diagnosis not present

## 2018-06-14 DIAGNOSIS — Z79899 Other long term (current) drug therapy: Secondary | ICD-10-CM | POA: Insufficient documentation

## 2018-06-14 DIAGNOSIS — I110 Hypertensive heart disease with heart failure: Secondary | ICD-10-CM | POA: Diagnosis not present

## 2018-06-14 DIAGNOSIS — S39012A Strain of muscle, fascia and tendon of lower back, initial encounter: Secondary | ICD-10-CM | POA: Diagnosis not present

## 2018-06-14 DIAGNOSIS — Z87891 Personal history of nicotine dependence: Secondary | ICD-10-CM | POA: Insufficient documentation

## 2018-06-14 DIAGNOSIS — Y999 Unspecified external cause status: Secondary | ICD-10-CM | POA: Diagnosis not present

## 2018-06-14 DIAGNOSIS — Z7982 Long term (current) use of aspirin: Secondary | ICD-10-CM | POA: Diagnosis not present

## 2018-06-14 DIAGNOSIS — Y929 Unspecified place or not applicable: Secondary | ICD-10-CM | POA: Diagnosis not present

## 2018-06-14 DIAGNOSIS — F0390 Unspecified dementia without behavioral disturbance: Secondary | ICD-10-CM | POA: Diagnosis not present

## 2018-06-14 DIAGNOSIS — M549 Dorsalgia, unspecified: Secondary | ICD-10-CM | POA: Diagnosis present

## 2018-06-14 DIAGNOSIS — Y939 Activity, unspecified: Secondary | ICD-10-CM | POA: Insufficient documentation

## 2018-06-14 LAB — URINALYSIS, ROUTINE W REFLEX MICROSCOPIC
BACTERIA UA: NONE SEEN
Bilirubin Urine: NEGATIVE
GLUCOSE, UA: 150 mg/dL — AB
Ketones, ur: NEGATIVE mg/dL
LEUKOCYTES UA: NEGATIVE
NITRITE: NEGATIVE
Protein, ur: NEGATIVE mg/dL
Specific Gravity, Urine: 1.019 (ref 1.005–1.030)
pH: 6 (ref 5.0–8.0)

## 2018-06-14 MED ORDER — ACETAMINOPHEN 325 MG PO TABS
650.0000 mg | ORAL_TABLET | Freq: Once | ORAL | Status: AC
  Administered 2018-06-14: 650 mg via ORAL
  Filled 2018-06-14: qty 2

## 2018-06-14 NOTE — ED Notes (Signed)
Discharge instructions reviewed with pt and pt's husband. Pt and husband verbalized understanding. Pt to follow up with PCP. Pt assisted with getting dressed, and brought out to car via wheelchair.

## 2018-06-14 NOTE — Discharge Instructions (Addendum)
If your back pain worsens or you develop difficulty walking, weakness or numbness in the legs or incontinence that is new then return to the ER for evaluation.  Otherwise follow-up with your primary care physician.

## 2018-06-14 NOTE — ED Provider Notes (Signed)
[facility] DEPT Provider Note   CSN: [id number] Arrival date & time: 06/14/18  1712   LEVEL 5 CAVEAT - DEMENTIA  History   Chief Complaint Chief Complaint  Patient presents with  . Back Pain    HPI Carla Robles is a 84 y.o. female.  HPI  84 year old female with a history of dementia presents with pain.  History is taken from the husband as the patient does not know what is going on has dementia.  2 days ago when trying to get her out of her wheelchair her legs buckled and she fell down to the ground.  She more slid down onto her buttocks.  He is not sure if she hit her back or buttocks on the wheel.  He was unable to pick her up because of her weight and so he made her bed and she slept on the floor.  When he tried to get her up yesterday, she complained that he was causing her pain.  Thus he called EMS to have her evaluated.  She had no pain or discomfort when they were able to get her up so they left.  The patient has been otherwise acting at her baseline.  Again today when trying to assist her with getting up she complained that he was hurting her.  Thus EMS was called and brought her in.  She has otherwise been acting at her baseline and has not been sick with having fevers, vomiting, or other complaints.  The patient normally can wait transfer a little bit with assistance but mostly is in a wheelchair.  Past Medical History:  Diagnosis Date  . Arthritis   . Breast cancer ([place])   . Chronic diastolic CHF (congestive heart failure) ([place])   . Cognitive impairment   . Hx of echocardiogram    Echo (9/14):  Mild focal basal septal hypertrophy, EF 55-60%, normal wall motion, Gr 1 DD   . Hypercholesteremia   . Hypertension   . Hyponatremia   . Leukocytosis   . NSTEMI (non-ST elevated myocardial infarction) ([place])    in setting of low Na:  Echo 7/22:  EF 60-65%, normal wall motion, mild LVH, grade 1 diast dysfxn, PASP 29.;  Myoview:  EF 85% and no ischemia    . Personal history of  adenomatouscolonic polyps 04/22/2012   2 diminutive adenomas 12/2006 [name])  . Physical deconditioning   . Risk for falls   . SIADH (syndrome of inappropriate ADH production) ([place])   . Urinary retention     Patient Active Problem List   Diagnosis Date Noted  . Unstable gait 06/13/2018  . LBBB (left bundle branch block) 01/01/2018  . Right hip pain 07/18/2017  . Moderate dementia, without behavioral disturbance 10/16/2016  . Urinary tract infection, site not specified 05/05/2016  . Pulmonary vascular congestion 05/05/2016  . SIADH (syndrome of inappropriate ADH production) ([place]) 05/05/2016  . Hypercholesteremia 05/05/2016  . Hyponatremia 05/04/2016  . Mild cognitive impairment 07/07/2015  . Personal history of  adenomatouscolonic polyps 04/22/2012  . Cardiac enzymes elevated 09/07/2011  . Essential hypertension 07/13/2011  . Hyperlipidemia 07/13/2011    Past Surgical History:  Procedure Laterality Date  . BREAST LUMPECTOMY     breast biopsy  . CATARACT EXTRACTION W/ INTRAOCULAR LENS  IMPLANT, BILATERAL  2011  . COLONOSCOPY       OB History   None      Home Medications    Prior to Admission medications   Medication Sig Start  Date End Date Taking? Authorizing Provider  aspirin 81 MG tablet Take 81 mg by mouth daily.      [provider]  Calcium Carbonate-Vitamin D (CALTRATE 600+D) 600-400 MG-UNIT per chew tablet Chew 1 tablet by mouth 2 (two) times daily.     [provider]  Cholecalciferol (VITAMIN D3) 1000 units CAPS Take by mouth.    [provider]  donepezil (ARICEPT) 10 MG tablet Take 1 tablet (10 mg total) by mouth at bedtime. 03/31/18   Cameron Sprang, MD  fish oil-omega-3 fatty acids 1000 MG capsule Take 1 g by mouth daily.      [provider]  lisinopril (PRINIVIL,ZESTRIL) 5 MG tablet  09/19/16   [provider]  memantine (NAMENDA) 5 MG tablet Take 1 tablet twice a day 03/31/18   Cameron Sprang, MD  Multiple Vitamin (MULTIVITAMIN) tablet Take 1 tablet by mouth daily.      [provider]    Family History Family History  Problem Relation Age of Onset  . Diabetes Father   . Stroke Father   . Hypertension Mother     Social History Social History   Tobacco Use  . Smoking status: Former Smoker    Types: Cigarettes  . Smokeless tobacco: Never Used  . Tobacco comment: quit 1969  Substance Use Topics  . Alcohol use: Yes    Alcohol/week: 0.0 oz    Comment: occ wine  . Drug use: No     Allergies   Ace inhibitors; Other; and Statins   Review of Systems Review of Systems  Unable to perform ROS: Dementia     Physical Exam Updated Vital Signs BP 133/72 (BP Location: Right Arm)   Pulse 71   Temp 97.7 F (36.5 C) (Oral)   Resp 20   SpO2 95%   Physical Exam  Constitutional: She appears well-developed and well-nourished. No distress.  HENT:  Head: Normocephalic and atraumatic.  Right Ear: External ear normal.  Left Ear: External ear normal.  Nose: Nose normal.  Eyes: Right eye exhibits no discharge. Left eye exhibits no discharge.  Cardiovascular: Normal rate, regular rhythm and normal heart sounds.  Pulmonary/Chest: Effort normal and breath sounds normal.  Abdominal: Soft. She exhibits no distension. There is no tenderness.  Musculoskeletal:       Left hip: She exhibits normal range of motion and no tenderness.       Lumbar back: She exhibits tenderness. She exhibits no bony tenderness.       Back:  Neurological: She is alert. She is disoriented.  Awake, alert but confused. No facial droop or slurred speech. 5/5 strength in all 4 extremities, but LLE is limited by pain in left lower back.  Skin: Skin is warm and dry. She is not diaphoretic.  Nursing note and vitals reviewed.    ED Treatments / Results  Labs (all labs ordered are listed, but only abnormal results are displayed) Labs Reviewed  URINALYSIS, ROUTINE W REFLEX MICROSCOPIC  - Abnormal; Notable for the following components:      Result Value   Glucose, UA 150 (*)    Hgb urine dipstick SMALL (*)    All other components within normal limits    EKG None  Radiology Dg Lumbar Spine Complete  Result Date: 06/14/2018 CLINICAL DATA:  LEFT-sided LOWER back pain. Patient was assisted to the floor of the bathroom by her husband, and she stayed on the floor all night. EXAM: LUMBAR SPINE - COMPLETE 4+ VIEW  COMPARISON:  CT of the abdomen and pelvis on 10/29/2006 FINDINGS: There is moderate degenerative change throughout the lumbar spine, most notably at L2-3. Facet hypertrophy is noted throughout the lumbar spine. There is significant disc height loss and uncovertebral spurring at L5-S1. There is a remote wedge compression fracture of T12, associated with anterior osteophytes. No acute fracture or traumatic subluxation. There is atherosclerotic calcification of the abdominal aorta. Visualized bowel gas pattern is nonobstructed. IMPRESSION: 1. Remote wedge compression fracture of T12. 2.  No evidence for acute  abnormality. 3. Degenerative changes in the lumbar spine. Electronically Signed   By: Nolon Nations M.D.   On: 06/14/2018 18:47   Dg Hip Unilat W Or Wo Pelvis 2-3 Views Left  Result Date: 06/14/2018 CLINICAL DATA:  Patient arrives with c/o lower back pain that started yesterday. Husband helped patient to ground yesterday after helping to toilet, patient stayed on floor all night EXAM: DG HIP (WITH OR WITHOUT PELVIS) 2-3V LEFT COMPARISON:  07/11/2017 FINDINGS: There is no acute fracture or subluxation. Mild degenerative changes are seen in the hips and LOWER spine. IMPRESSION: No evidence for acute  abnormality. Electronically Signed   By: Nolon Nations M.D.   On: 06/14/2018 18:43    Procedures Procedures (including critical care time)  Medications Ordered in ED Medications  acetaminophen (TYLENOL) tablet 650 mg (650 mg Oral Given 06/14/18 1808)     Initial  Impression / Assessment and Plan / ED Course  I have reviewed the triage vital signs and the nursing notes.  Pertinent labs & imaging results that were available during my care of the patient were reviewed by me and considered in my medical decision making (see chart for details).     Talking with the husband, it sounds like she had an episode of some weakness in the legs and fell.  This is a recurrent issue.  She rarely walks now except for small pivoting and only a few steps.  However she is otherwise acting at her baseline and her vital signs are unremarkable.  She has a little bit of low back pain but her x-rays are reassuring.  The T12 fracture appears remote and this is not really where she was tender.   highly doubt occult hip fracture as she is able to ambulate with assistance without tenderness or pain.  Thus, she will be discharged home.  I highly doubt an intra-abdominal or retroperitoneal process or emergency.  Discharge home with return precautions.  Final Clinical Impressions(s) / ED Diagnoses   Final diagnoses:  Strain of lumbar paraspinous muscle, initial encounter    ED Discharge Orders    None       Sherwood Gambler, MD 06/14/18 2221

## 2018-06-14 NOTE — ED Notes (Signed)
Bed: WA09 Expected date: 06/14/18 Expected time: 5:15 PM Means of arrival: Ambulance Comments: Chronic back pain

## 2018-06-14 NOTE — ED Notes (Signed)
Patient ambulated with 2 person assist. Patient able to take 3-4 steps. Patient denies back pain,

## 2018-06-14 NOTE — ED Triage Notes (Addendum)
Patient arrives withi c/o lower back pain that started yesterday. Husband helped patient to ground yesterday after helping to toilet, patient stayed on floor all night. Patient unable to bear weight. Patient wheelchair bound. No hx back surgeries. Hx dementia, at baseline. Reports no pain at this time.   BP: 142/90 HR: 78 RR: 16

## 2018-06-16 NOTE — Telephone Encounter (Signed)
Left message informing that referral was placed for home health.

## 2018-06-19 ENCOUNTER — Telehealth: Payer: Self-pay | Admitting: Family Medicine

## 2018-06-19 NOTE — Telephone Encounter (Signed)
Please advise if orders are ok to send

## 2018-06-19 NOTE — Telephone Encounter (Signed)
Copied from [place] [phone]. Topic: Quick Communication - See Telephone Encounter >> Jun 19, 2018  3:31 PM Burchel, Abbi R wrote: CRM for notification. See Telephone encounter for: 06/19/18.  V/o for 2x4wk starting next week and OT and ST eval   [phone]  Can leave VM msg.

## 2018-06-20 NOTE — Telephone Encounter (Signed)
Message sent to Dr. Jordan for review and approval. 

## 2018-06-20 NOTE — Telephone Encounter (Signed)
Spoke with Constance Haw, gave verbal orders for OT and ST evaluation as requested per Dr. Martinique.

## 2018-06-20 NOTE — Telephone Encounter (Signed)
It is okay to give verbal authorization for OT and ST evaluation as requested. Thanks, BJ

## 2018-06-23 ENCOUNTER — Telehealth: Payer: Self-pay | Admitting: Family Medicine

## 2018-06-23 NOTE — Telephone Encounter (Signed)
Spoke with Will, gave verbal orders for OT as requested.

## 2018-06-23 NOTE — Telephone Encounter (Signed)
Copied from [place] [phone]. Topic: General - Other >> Jun 23, 2018  3:59 PM Oneta Rack wrote: Caller name: Will  Relation to pt: OT from [facility]  Call back number: [phone]   Reason for call:  Requesting verbal orders for OT 1x 1 2x 2 1x 3

## 2018-06-27 ENCOUNTER — Telehealth: Payer: Self-pay | Admitting: Family Medicine

## 2018-06-27 NOTE — Telephone Encounter (Unsigned)
Copied from [place] [phone]. Topic: General - Other >> Jun 27, 2018  8:38 AM Carolyn Stare wrote:  Lynelle Smoke with [facility] call to ask for speech therapy orders 1 x 1   2 x 2 , 1 x 1 for memory cognition    [phone] can leave a message

## 2018-06-27 NOTE — Telephone Encounter (Signed)
Left message for Tammy to give me a call back for verbal orders.

## 2018-06-30 ENCOUNTER — Telehealth: Payer: Self-pay | Admitting: Family Medicine

## 2018-06-30 NOTE — Telephone Encounter (Signed)
Spoke with Constance Haw, gave verbal orders for nurse to evaluate calf swelling.

## 2018-06-30 NOTE — Telephone Encounter (Signed)
Copied from [place] [phone]. Topic: Quick Communication - See Telephone Encounter >> Jun 30, 2018 10:50 AM Conception Chancy, NT wrote: CRM for notification. See Telephone encounter for: 06/30/18.  Constance Haw is ca physical therapist with [facility] requesting verbal orders for a nurse to evaluate patient for left calf swelling that developed over the weekend.   Cb# [phone]

## 2018-06-30 NOTE — Telephone Encounter (Signed)
Verbal orders give to Tammy for Speech Therapy as requested.

## 2018-07-01 ENCOUNTER — Telehealth: Payer: Self-pay | Admitting: Family Medicine

## 2018-07-01 NOTE — Telephone Encounter (Signed)
I do not see ER visit. Please advise to continue monitoring and arrange ER F/U appt.  Thanks, BJ

## 2018-07-01 NOTE — Telephone Encounter (Signed)
Carla Robles: calling to report status of patient  Patient fell 7/19 and was seen at hospital with no fracture. Husband reported swelling in leg 7/27. Nurse is out for first assessment today: +2 pitting, cool to touch- R leg equal to left leg,  + bilateral pedal pulse, some bruising present. She is available to come back for more assessments. Contact# [phone]

## 2018-07-01 NOTE — Telephone Encounter (Signed)
Message sent to Dr. Jordan for review. 

## 2018-07-02 NOTE — Telephone Encounter (Signed)
Spoke with Caryl Pina and informed her to continue to monitor patient. Informed Caryl Pina that I called patient to try and schedule a follow-up before calling her but didn't get an answer, she stated that she would follow-up with them and make sure that they called office back to schedule appointment.

## 2018-07-15 ENCOUNTER — Ambulatory Visit (HOSPITAL_COMMUNITY)
Admission: RE | Admit: 2018-07-15 | Discharge: 2018-07-15 | Disposition: A | Discharge status: Home / Self Care | Payer: Medicare Other | Source: Ambulatory Visit | Attending: Cardiology | Admitting: Cardiology

## 2018-07-15 ENCOUNTER — Ambulatory Visit: Payer: Medicare Other | Admitting: Family Medicine

## 2018-07-15 ENCOUNTER — Telehealth: Payer: Self-pay | Admitting: Family Medicine

## 2018-07-15 ENCOUNTER — Encounter: Payer: Self-pay | Admitting: Family Medicine

## 2018-07-15 VITALS — BP 120/70 | HR 94 | Temp 97.6°F | Resp 12 | Ht 62.0 in | Wt 177.0 lb

## 2018-07-15 DIAGNOSIS — R81 Glycosuria: Secondary | ICD-10-CM

## 2018-07-15 DIAGNOSIS — F0390 Unspecified dementia without behavioral disturbance: Secondary | ICD-10-CM

## 2018-07-15 DIAGNOSIS — E119 Type 2 diabetes mellitus without complications: Secondary | ICD-10-CM | POA: Insufficient documentation

## 2018-07-15 DIAGNOSIS — R600 Localized edema: Secondary | ICD-10-CM | POA: Diagnosis not present

## 2018-07-15 DIAGNOSIS — F03B0 Unspecified dementia, moderate, without behavioral disturbance, psychotic disturbance, mood disturbance, and anxiety: Secondary | ICD-10-CM

## 2018-07-15 DIAGNOSIS — E118 Type 2 diabetes mellitus with unspecified complications: Secondary | ICD-10-CM | POA: Diagnosis not present

## 2018-07-15 DIAGNOSIS — I824Y2 Acute embolism and thrombosis of unspecified deep veins of left proximal lower extremity: Secondary | ICD-10-CM

## 2018-07-15 LAB — POCT GLYCOSYLATED HEMOGLOBIN (HGB A1C): Hemoglobin A1C: 7.4 % — AB (ref 4.0–5.6)

## 2018-07-15 MED ORDER — RIVAROXABAN (XARELTO) VTE STARTER PACK (15 & 20 MG): ORAL_TABLET | ORAL | 0 refills | Status: DC

## 2018-07-15 NOTE — Telephone Encounter (Signed)
CRITICAL VALUE STICKER  CRITICAL VALUE: + DVT in iliac vein down the leg. Barnett Applebaum is not sure which leg this is in as patient is still having imaging completed on the other leg.  RECEIVER (on-site recipient of call): Dorrene German, RN  DATE & TIME NOTIFIED: 07/15/18 1517  MESSENGER (representative from lab): Gina from [employer]. Call back to [facility] if needed at [phone].  MD NOTIFIED: Betty Martinique, MD  TIME OF NOTIFICATION: 5973  RESPONSE: Patient may go home after procedure is completed and we will call her with further instructions. Per Dr. Martinique she will need anticoagulation. If pt develops and SOB or difficulty breathing, she should go to ED. Returned call to [place] and she will notify patient/spouse of instructions.

## 2018-07-15 NOTE — Assessment & Plan Note (Addendum)
New onset. Husband was educated about diagnosis, information about A1c was also given.  Given her health history as well as her age I think 7.4 is appropriate. I recommend nonpharmacologic treatment for now, decrease sweets and carbs intake. Adequate foot care. We will recheck in 4 to 6 months. Eye exam was within the past year, recommended annually.

## 2018-07-15 NOTE — Assessment & Plan Note (Signed)
Per husband report, it seems to be stable. Intervention is difficult due to history of dementia. She is currently following with Dr. Delice Lesch.

## 2018-07-15 NOTE — Telephone Encounter (Signed)
Call report: Preliminary report: positive for blood clot- iliac vein all the way down patient leg Call to office- Hoyle Sauer conferenced in for the report.

## 2018-07-15 NOTE — Telephone Encounter (Signed)
I sent prescription for Xarelto to her pharmacy, to start 15 mg twice daily for 21 days then 20 mg at night with food.  Xorelto can increase the risk of bleeding, including GI and brain.  Because this is her first event she will need to take medication for 3 months. Stopped Aspirin while taking Xarelto. If respiratory distress, wheezing, chest pain, MS changes she needs to go to the ER.  Please arrange follow-up appointment for next week.  Thanks, BJ

## 2018-07-15 NOTE — Telephone Encounter (Signed)
Patient's spouse notified of instructions and verbalized understanding. 1 week follow-up appt scheduled as directed.

## 2018-07-15 NOTE — Patient Instructions (Addendum)
A few things to remember from today's visit:   Glucosuria - Plan: POCT glycosylated hemoglobin (Hb A1C)  Edema of left lower extremity - Plan: VAS [place] LOWER EXTREMITY VENOUS (DVT)  Type 2 diabetes mellitus with complication, without long-term current use of insulin (HCC)  Moderate dementia, without behavioral disturbance  New onset of diabetes, for now I will recommend a healthy diet low in sweets and carbs. We will recheck hemoglobin A1c in 5 to 6 months. Hemoglobin A1c Test Some of the sugar (glucose) that circulates in your blood sticks or binds to blood proteins. Hemoglobin (Hb or Hgb) is one type of blood protein that glucose binds to. It also carries oxygen in the red blood cells (RBCs). When glucose binds to Hb, the glucose-coated Hb is called glycated Hb. Once Hb is glycated, it remains that way for the life of the RBC. This is about 120 days. Rather than testing your blood glucose level on one single day, the hemoglobin A1c (HbA1c) test measures the average amount of glycated hemoglobin and, therefore, the average amount of glucose in your blood during the 3-4 months just before the test is done. The HbA1c test is used to monitor long-term control of blood sugar in people who have diabetes mellitus. The HbA1c test can also be used in addition to or in combination with fasting blood glucose level and oral glucose tolerance tests. What do the results mean? It is your responsibility to obtain your test results. Ask the lab or department performing the test when and how you will get your results. Contact your health care provider to discuss any questions you have about your results. Range of Normal Values Ranges for normal values may vary among different labs and hospitals. You should always check with your health care provider after having lab work or other tests done to discuss the meaning of your test results and whether your values are considered within normal limits. The ranges for normal  HbA1c test results are as follows:  Adult or child without diabetes: 4-5.9%.  Adult or child with diabetes and good blood glucose control: less than 6.5%.  Several factors can affect HbA1c test results. These may include:  Diseases (hemoglobinopathies) that cause a change in the shape, size, or amount of Hb in your blood.  Longer than normal RBC life span.  Abnormally low levels of certain proteins in your blood.  Eating foods or taking supplements that are high in vitamin C (ascorbic acid).  Meaning of Results Outside Normal Value Ranges Abnormally high HbA1c values are most commonly an indication of prediabetes mellitus and diabetes mellitus:  An HbA1c result of 5.7-6.4% is considered diagnostic of prediabetes mellitus.  An HbA1c result of 6.5% or higher on two separate occasions is considered diagnostic of diabetes mellitus.  Abnormally low HbA1c values can be caused by several health conditions. These may include:  Pregnancy.  A large amount of blood loss.  Blood transfusions.  Low red blood cell count (anemia). This is caused by premature destruction of red blood cells.  Long-term kidney failure.  Some unusual forms of Hb (Hb variants), such as sickle cell trait.  Discuss your test results with your health care provider. He or she will use the results to make a diagnosis and determine a treatment plan that is right for you. Talk with your health care provider to discuss your results, treatment options, and if necessary, the need for more tests. Talk with your health care provider if you have any questions about  your results. This information is not intended to replace advice given to you by your health care provider. Make sure you discuss any questions you have with your health care provider. Document Released: 12/11/2004 Document Revised: 08/15/2016 Document Reviewed: 04/05/2014 Elsevier Interactive Patient Education  2018 [employer].  Please be sure medication  list is accurate. If a new problem present, please set up appointment sooner than planned today.

## 2018-07-15 NOTE — Telephone Encounter (Signed)
Attempted to call patient/spouse on both numbers listed. Left message on mobile number, home phone number has been disconnected.

## 2018-07-15 NOTE — Progress Notes (Signed)
HPI:   [name] is a 84 y.o. female, who is here today to follow on recent ED visit.  She is here with her husband, who provides history. She has history of dementia and unstable gait.  She was evaluated in the ER on 06/14/2018 complaining about back pain, which started after falling when trying to get in her wheelchair when she was in the bathroom.  Her husband was not able to help her get up, so let sleep on the floor until next day when he called his son. Because she was complaining of left hip pain, her husband decided to take her to the ER.   Lumbar x-ray showed remote which compression fracture of T12, degenerative changes in the lumbar spine, and no evidence for acute abnormality. Lafe hip/pelvic x-ray did not show acute abnormality.  Diagnosed with lumbar muscle strain. Pain resolved after a couple days. Her husband has not noted MS changes.  Urinalysis was performed, otherwise negative except for glucose and urine.  Her husband is also concerned about persistent left lower extremity edema since the time she had the fall. It is constant and with no associated erythema. She is not sure if left leg is usually bigger than right and did not notice edema before fall.   She does not seem to be complaining of pain. Negative for dyspnea, cough, or wheezing. She has no complaint of chest pain or palpitations. No recent travel or surgery. She is most of the time in her wheelchair, she uses a walker when doing PT.  She is doing PT twice per week and she has a aid that comes 3 times per week.   History of prediabetes. According to her husband, in general she is following a healthy diet.  Lab Results  Component Value Date   HGBA1C 6.1 (H) 06/24/2011     Per husband report: Review of Systems  Unable to perform ROS: Dementia  Constitutional: Positive for fatigue (No more than usual.). Negative for activity change, appetite change and fever.  Respiratory: Negative  for cough, shortness of breath and wheezing.   Cardiovascular: Positive for leg swelling.  Gastrointestinal: Negative for abdominal pain, nausea and vomiting.       Negative for changes in bowel habits.  Genitourinary: Negative for decreased urine volume, dysuria and hematuria.  Musculoskeletal: Positive for gait problem.  Skin: Negative for rash and wound.  Neurological: Negative for syncope.  Psychiatric/Behavioral: Positive for confusion. Negative for hallucinations.      Current Outpatient Medications on File Prior to Visit  Medication Sig Dispense Refill  . aspirin 81 MG tablet Take 81 mg by mouth daily.      . Calcium Carbonate-Vitamin D (CALTRATE 600+D) 600-400 MG-UNIT per chew tablet Chew 1 tablet by mouth 2 (two) times daily.     . Cholecalciferol (VITAMIN D3) 1000 units CAPS Take by mouth.    . donepezil (ARICEPT) 10 MG tablet Take 1 tablet (10 mg total) by mouth at bedtime. 90 tablet 3  . fish oil-omega-3 fatty acids 1000 MG capsule Take 1 g by mouth daily.      Marland Kitchen lisinopril (PRINIVIL,ZESTRIL) 5 MG tablet     . memantine (NAMENDA) 5 MG tablet Take 1 tablet twice a day 180 tablet 3  . Multiple Vitamin (MULTIVITAMIN) tablet Take 1 tablet by mouth daily.       No current facility-administered medications on file prior to visit.      Past Medical History:  Diagnosis Date  .  Arthritis   . Breast cancer ([place])   . Chronic diastolic CHF (congestive heart failure) ([place])   . Cognitive impairment   . Hx of echocardiogram    Echo (9/14):  Mild focal basal septal hypertrophy, EF 55-60%, normal wall motion, Gr 1 DD   . Hypercholesteremia   . Hypertension   . Hyponatremia   . Leukocytosis   . NSTEMI (non-ST elevated myocardial infarction) ([place])    in setting of low Na:  Echo 7/22:  EF 60-65%, normal wall motion, mild LVH, grade 1 diast dysfxn, PASP 29.;  Myoview:  EF 85% and no ischemia  . Personal history of  adenomatouscolonic polyps 04/22/2012   2 diminutive adenomas 12/2006  Carla Robles)  . Physical deconditioning   . Risk for falls   . SIADH (syndrome of inappropriate ADH production) ([place])   . Urinary retention    Allergies  Allergen Reactions  . Ace Inhibitors Other (See Comments)    Hyponatremia  . Statins Other (See Comments)    Muscle aches  Muscle aches  . Other     hmg coa redustase inhibitors - muscle aches    Social History   Socioeconomic History  . Marital status: Married    Spouse name: Not on file  . Number of children: 3  . Years of education: Not on file  . Highest education level: Not on file  Occupational History  . Occupation: Retired  Scientific laboratory technician  . Financial resource strain: Not on file  . Food insecurity:    Worry: Not on file    Inability: Not on file  . Transportation needs:    Medical: Not on file    Non-medical: Not on file  Tobacco Use  . Smoking status: Former Smoker    Types: Cigarettes  . Smokeless tobacco: Never Used  . Tobacco comment: quit 1969  Substance and Sexual Activity  . Alcohol use: Yes    Alcohol/week: 0.0 standard drinks    Comment: occ wine  . Drug use: No  . Sexual activity: Never  Lifestyle  . Physical activity:    Days per week: Not on file    Minutes per session: Not on file  . Stress: Not on file  Relationships  . Social connections:    Talks on phone: Not on file    Gets together: Not on file    Attends religious service: Not on file    Active member of club or organization: Not on file    Attends meetings of clubs or organizations: Not on file    Relationship status: Not on file  Other Topics Concern  . Not on file  Social History Narrative  . Not on file    Vitals:   07/15/18 1008  BP: 120/70  Pulse: 94  Resp: 12  Temp: 97.6 F (36.4 C)  SpO2: 95%   Body mass index is 32.37 kg/m.   Physical Exam  Nursing note and vitals reviewed. Constitutional: She appears well-developed. She is cooperative. She does not appear ill. No distress.  HENT:  Head: Normocephalic  and atraumatic.  Mouth/Throat: Oropharynx is clear and moist and mucous membranes are normal.  Eyes: Conjunctivae are normal.  Cardiovascular: Normal rate and regular rhythm.  No murmur heard. Left calf is beginning diameter when compared with right one upon inspection. It feels tight but there is not pain with Homans. DP pulses are present, bilateral.  Respiratory: Effort normal and breath sounds normal. No respiratory distress.  GI: Soft. There is no  tenderness.  Musculoskeletal: She exhibits edema (LLE, 1+.).  Neurological: She is alert. She has normal strength. Gait abnormal.  She is not ambulatory, she is in her wheelchair. She is oriented in person. He does not know with our president, date of her wedding anniversary. She remembers her DOB but it takes her a few minutes to resolve. She is not oriented in place or time.  Skin: Skin is warm. No rash noted. No erythema.  Psychiatric: She has a normal mood and affect.  Well groomed, good eye contact.    ASSESSMENT AND PLAN:  Carla Robles was seen today for er follow-up.  Orders Placed This Encounter  Procedures  . POCT glycosylated hemoglobin (Hb A1C)   Lab Results  Component Value Date   HGBA1C 7.4 (A) 07/15/2018    Edema of left lower extremity  She does not seem to be having pain. We will arrange left lower extremity venous US to evaluate for DVT. We discussed treatment in case of DVT as well as side effects. Husband voices understanding.  -     VAS [place] LOWER EXTREMITY VENOUS (DVT); Future  Glucosuria -     POCT glycosylated hemoglobin (Hb A1C)  Moderate dementia, without behavioral disturbance Per husband report, it seems to be stable. Intervention is difficult due to history of dementia. She is currently following with Dr. Delice Lesch.   Diabetes mellitus, type II ([place]) New onset. Husband was educated about diagnosis, information about A1c was also given.  Given her health history as well as her age I think 7.4 is  appropriate. I recommend nonpharmacologic treatment for now, decrease sweets and carbs intake. Adequate foot care. We will recheck in 4 to 6 months. Eye exam was within the past year, recommended annually.    40 min face to face OV. > 50% was dedicated to discussion of differential dx and hypothetical treatment options in case DVT is present and some side effects of anticoagulation. After visit was completed and right before leaving, Ms. Cookston was inquiring about reason for vein [place].  We reviewed physical findings today and explained that it was a concern for DVT given her history of recent trauma + poor mobility, she seems to be satisfied with explanation and agreeable with test. Husband has appt information.   Addendum: Verbal report for vein ultrasound: Acute DVT that extends from the common femoral vein to the popliteal and gastrocnemius vein.  Acute deep vein thrombosis (DVT) of proximal vein of left lower extremity ([place])  During evaluation today she was not in respiratory distress, hemodynamically stable.  So we will start anticoagulation as outpatient with Xarelto.  In case her husband has difficulty obtaining medication we need to consider hospitalization. Information was conveyed to Ms. Blancett's husband. See phone call.  -     Rivaroxaban 15 & 20 MG TBPK; Start with one 15mg  tablet by mouth twice a day with food x 21 d. On Day 22, switch to one 20mg  tablet once a day with food.        Cain Fitzhenry G. Martinique, MD  [facility]. [place] office.

## 2018-07-16 ENCOUNTER — Other Ambulatory Visit: Payer: Self-pay | Admitting: Family Medicine

## 2018-07-16 NOTE — Telephone Encounter (Signed)
Called patient per request of Dr. Martinique to follow-up and ensure she able to obtain and start Xarelto. Left message for patient to return call.

## 2018-07-16 NOTE — Telephone Encounter (Signed)
Spoke with pt's husband (DPR) and he advised they picked up rx today and plan to start in the morning since it is BID. He states she had PT and nursing come out today and he advised them of diagnosis and tx. He is very pleased to now know what is going on and was very thankful for all we have done.

## 2018-07-23 ENCOUNTER — Ambulatory Visit: Payer: Medicare Other | Admitting: Family Medicine

## 2018-07-23 ENCOUNTER — Encounter: Payer: Self-pay | Admitting: Family Medicine

## 2018-07-23 VITALS — BP 122/78 | HR 81 | Temp 97.6°F | Resp 16 | Ht 62.0 in | Wt 174.4 lb

## 2018-07-23 DIAGNOSIS — Z7901 Long term (current) use of anticoagulants: Secondary | ICD-10-CM

## 2018-07-23 DIAGNOSIS — K625 Hemorrhage of anus and rectum: Secondary | ICD-10-CM

## 2018-07-23 DIAGNOSIS — I82402 Acute embolism and thrombosis of unspecified deep veins of left lower extremity: Secondary | ICD-10-CM | POA: Diagnosis not present

## 2018-07-23 DIAGNOSIS — L22 Diaper dermatitis: Secondary | ICD-10-CM

## 2018-07-23 NOTE — Progress Notes (Signed)
HPI:   [name] is a 84 y.o. female, who is here today with husband and [place] aid to follow on recent OV.   She was seen on 07/15/2018, when her husband was concerned about persistent left lower extremity edema. She has history of dementia, so it is difficult to obtain history from patient.  Lower extremity venous US showed left acute, non occlusive, DVT in the common femoral vein. Acute, occlusive, DVT in the proximal-distal Femoral vein, Popliteal vein, proximal Profunda vein, and Gastrocnemius vein. Extension of common femoral vein obstruction  proximal to the inguinal ligament is noted in the distal external iliac vein without evidence of flow. Vessel dilatation noted at the mid FV and gastrocnemius vein.  She is currently on Xarelto 15 mg twice daily. So far she has tolerated medication well.   Today morning her husband noted some blood on tissue after wiping area, he did not noted blood on diaper. He has not noted gum/nose bleeding, more bruising than usual, dry blood in the stool, or gross hematuria. She has not had MS changes and seems to be comfortable.  Lower extremity edema has also improved.  Home health aid also mentions that she has some irritation/erythema on buttocks, she has not noted ulcerations. She wears diapers.    Review of Systems  Constitutional: Negative for activity change, appetite change, fatigue and fever.  HENT: Negative for mouth sores and nosebleeds.   Respiratory: Negative for shortness of breath and wheezing.   Cardiovascular: Positive for leg swelling. Negative for chest pain and palpitations.  Gastrointestinal: Negative for abdominal pain, nausea and vomiting.       Negative for changes in bowel habits.  Genitourinary: Negative for decreased urine volume and hematuria.  Musculoskeletal: Positive for gait problem.  Skin: Positive for rash. Negative for wound.  Neurological: Negative for syncope, facial asymmetry and headaches.       Current Outpatient Medications on File Prior to Visit  Medication Sig Dispense Refill  . aspirin 81 MG tablet Take 81 mg by mouth daily.      . Calcium Carbonate-Vitamin D (CALTRATE 600+D) 600-400 MG-UNIT per chew tablet Chew 1 tablet by mouth 2 (two) times daily.     . Cholecalciferol (VITAMIN D3) 1000 units CAPS Take by mouth.    . donepezil (ARICEPT) 10 MG tablet Take 1 tablet (10 mg total) by mouth at bedtime. 90 tablet 3  . fish oil-omega-3 fatty acids 1000 MG capsule Take 1 g by mouth daily.      Marland Kitchen lisinopril (PRINIVIL,ZESTRIL) 5 MG tablet TAKE 1 TABLET BY MOUTH EVERY DAY 90 tablet 0  . memantine (NAMENDA) 5 MG tablet Take 1 tablet twice a day 180 tablet 3  . Multiple Vitamin (MULTIVITAMIN) tablet Take 1 tablet by mouth daily.      . Rivaroxaban 15 & 20 MG TBPK Start with one 15mg  tablet by mouth twice a day with food x 21 d. On Day 22, switch to one 20mg  tablet once a day with food. 51 each 0   No current facility-administered medications on file prior to visit.      Past Medical History:  Diagnosis Date  . Arthritis   . Breast cancer ([place])   . Chronic diastolic CHF (congestive heart failure) ([place])   . Cognitive impairment   . Hx of echocardiogram    Echo (9/14):  Mild focal basal septal hypertrophy, EF 55-60%, normal wall motion, Gr 1 DD   . Hypercholesteremia   .  Hypertension   . Hyponatremia   . Leukocytosis   . NSTEMI (non-ST elevated myocardial infarction) ([place])    in setting of low Na:  Echo 7/22:  EF 60-65%, normal wall motion, mild LVH, grade 1 diast dysfxn, PASP 29.;  Myoview:  EF 85% and no ischemia  . Personal history of  adenomatouscolonic polyps 04/22/2012   2 diminutive adenomas 12/2006 Carla Robles)  . Physical deconditioning   . Risk for falls   . SIADH (syndrome of inappropriate ADH production) (Carla Robles)   . Urinary retention    Allergies  Allergen Reactions  . Ace Inhibitors Other (See Comments)    Hyponatremia  . Statins Other (See Comments)     Muscle aches  Muscle aches  . Other     hmg coa redustase inhibitors - muscle aches    Social History   Socioeconomic History  . Marital status: Married    Spouse name: Not on file  . Number of children: 3  . Years of education: Not on file  . Highest education level: Not on file  Occupational History  . Occupation: Retired  Scientific laboratory technician  . Financial resource strain: Not on file  . Food insecurity:    Worry: Not on file    Inability: Not on file  . Transportation needs:    Medical: Not on file    Non-medical: Not on file  Tobacco Use  . Smoking status: Former Smoker    Types: Cigarettes  . Smokeless tobacco: Never Used  . Tobacco comment: quit 1969  Substance and Sexual Activity  . Alcohol use: Yes    Alcohol/week: 0.0 standard drinks    Comment: occ wine  . Drug use: No  . Sexual activity: Never  Lifestyle  . Physical activity:    Days per week: Not on file    Minutes per session: Not on file  . Stress: Not on file  Relationships  . Social connections:    Talks on phone: Not on file    Gets together: Not on file    Attends religious service: Not on file    Active member of club or organization: Not on file    Attends meetings of clubs or organizations: Not on file    Relationship status: Not on file  Other Topics Concern  . Not on file  Social History Narrative  . Not on file    Vitals:   07/23/18 1458  BP: 122/78  Pulse: 81  Resp: 16  Temp: 97.6 F (36.4 C)  SpO2: 95%   Body mass index is 31.89 kg/m.   Physical Exam  Nursing note and vitals reviewed. Constitutional: She appears well-developed. She is cooperative. No distress.  HENT:  Head: Normocephalic and atraumatic.  Mouth/Throat: Oropharynx is clear and moist and mucous membranes are normal.  Eyes: Pupils are equal, round, and reactive to light. Conjunctivae are normal.  Cardiovascular: Normal rate and regular rhythm.  No murmur heard. Respiratory: Effort normal and breath sounds  normal. No respiratory distress.  GI: Soft. She exhibits no mass. There is no tenderness.  Musculoskeletal: She exhibits edema (Right peri-ankle edema, LLE pitting edema 1+.). She exhibits no tenderness.  Neurological: She is alert. She has normal strength. She is disoriented. No cranial nerve deficit.  She is in her wheelchair.  Skin: Skin is warm. No rash noted. There is erythema.  Mild erythematosus area on inner aspect of buttocks. There is no tenderness or induration. I do not appreciate ulcers.  Psychiatric: She has a  normal mood and affect.  Fairly groomed, good eye contact.    ASSESSMENT AND PLAN:  Ms. Carla Robles was seen today for follow-up.  Orders Placed This Encounter  Procedures  . CBC   Lab Results  Component Value Date   WBC 9.4 07/23/2018   HGB 14.5 07/23/2018   HCT 44.0 07/23/2018   MCV 92.5 07/23/2018   PLT 304.0 07/23/2018     Acute deep vein thrombosis (DVT) of left lower extremity, unspecified vein ([place])  We discussed diagnosis, prognosis, and treatment recommendations. We will plan on keeping her on Xarelto for 3 to 4 months. She is not very ambulatory, recommend trying to use her walker at home more often, fall prevention. Compression stocking on left lower extremity may help with edema. Clearly instructed about warning signs.  -     CBC  Diaper rash  We discussed adequate skin care to prevent decubitus ulcer. OTC Desitin several times per day and avoid prolonged wet diaper. A donut pillow may also help to prevent constant pressure on area since she sits most of the time.  On anticoagulant therapy  We discussed some side effects of Xarelto, including brain and GI bleed. For now we will continue medication. Instructed about warning signs.  Rectal bleed  One episode of a small amount of blood on tissue. We will continue Xarelto back if problem becomes recurrent or if he gets worse we may need to discontinue medication. Colonoscopy in May/2013:  Moderate diverticulosis and internal hemorrhoids, otherwise normal examination.  Avoid constipation. Clearly instructed about warning signs, her husband voices understanding.    Omie Ferger G. Martinique, MD  [facility]. [place] office.

## 2018-07-23 NOTE — Patient Instructions (Signed)
A few things to remember from today's visit:   On anticoagulant therapy  Acute deep vein thrombosis (DVT) of left lower extremity, unspecified vein ([facility]) - Plan: CBC  Diaper rash  Monitor for bleeding.  Will complete 3 months of Xarelto.   Please be sure medication list is accurate. If a new problem present, please set up appointment sooner than planned today.

## 2018-07-24 LAB — CBC
HEMATOCRIT: 44 % (ref 36.0–46.0)
HEMOGLOBIN: 14.5 g/dL (ref 12.0–15.0)
MCHC: 33 g/dL (ref 30.0–36.0)
MCV: 92.5 fl (ref 78.0–100.0)
PLATELETS: 304 10*3/uL (ref 150.0–400.0)
RBC: 4.75 Mil/uL (ref 3.87–5.11)
RDW: 15.6 % — AB (ref 11.5–15.5)
WBC: 9.4 10*3/uL (ref 4.0–10.5)

## 2018-08-06 ENCOUNTER — Telehealth: Payer: Self-pay | Admitting: Family Medicine

## 2018-08-06 NOTE — Telephone Encounter (Signed)
Copied from [place] [id number]. Topic: Quick Communication - See Telephone Encounter >> Aug 06, 2018  1:37 PM Hewitt Shorts wrote: Caryl Pina with [facility] is calling to say that the pt husband has been noticing blood in stool for two days just with wiping and dark brown stools -nothing today but patient is  Currently on xarelto does pt need to be seen or does home health need to continue (pt is due to be discharged today from home heealth)  Best number [phone] Caryl Pina

## 2018-08-06 NOTE — Telephone Encounter (Signed)
Message sent to Dr. Jordan for review. Please advise 

## 2018-08-08 NOTE — Telephone Encounter (Signed)
I think that as far as blood on tissue is not much we can continue Xarelto,benefit is greater that risk. If it gets worse or she has vomiting with blood,abdominal pain, unusual headache,MS changes among some we worrisome symptoms she needs to be evaluated as soon as possible and then we need to consider stopping blood thinners.  Thanks, BJ

## 2018-08-08 NOTE — Telephone Encounter (Signed)
Spoke with patient's husband and he stated that he only saw the blood that one time. Patient is doing well. He stated that the PT was coming out on today at 3 pm and they may be discharging her soon, but she has made great progress since starting PT.

## 2018-08-22 ENCOUNTER — Encounter: Payer: Self-pay | Admitting: Neurology

## 2018-08-22 ENCOUNTER — Other Ambulatory Visit: Payer: Self-pay

## 2018-08-22 ENCOUNTER — Ambulatory Visit: Payer: Medicare Other | Admitting: Neurology

## 2018-08-22 VITALS — BP 116/60 | HR 87 | Ht 62.0 in | Wt 170.0 lb

## 2018-08-22 DIAGNOSIS — F0390 Unspecified dementia without behavioral disturbance: Secondary | ICD-10-CM | POA: Diagnosis not present

## 2018-08-22 DIAGNOSIS — F03B0 Unspecified dementia, moderate, without behavioral disturbance, psychotic disturbance, mood disturbance, and anxiety: Secondary | ICD-10-CM

## 2018-08-22 MED ORDER — MEMANTINE HCL 5 MG PO TABS: ORAL_TABLET | ORAL | 3 refills | Status: AC

## 2018-08-22 MED ORDER — DONEPEZIL HCL 10 MG PO TABS: 10.0000 mg | ORAL_TABLET | Freq: Every day | ORAL | 3 refills | Status: AC

## 2018-08-22 NOTE — Progress Notes (Signed)
NEUROLOGY FOLLOW UP OFFICE NOTE  Carla Robles [id number]  DOB: 03-Jun-1934  HISTORY OF PRESENT ILLNESS: I had the pleasure of seeing Carla Robles in follow-up in the neurology clinic on 08/22/2018.  The patient was last seen almost a year ago for moderate dementia. She is again accompanied by her husband who helps supplement the history today. MMSE in May 2018 was 22/30. She is on Aricept 10mg  daily and Namenda 5mg  BID without side effects. She does not drive. Her husband is in charge of bills and her medications. She now needs assistance with dressing and bathing, they have an aide coming three times a week. She is able to feed herself. Her husband denies any paranoia or hallucinations. She was in the ER 2 months ago when she fell trying to get in her wheelchair in the bathroom. Her husband could not get her up, she was reporting back pain and leg weakness, he put a mattress on the floor and let her sleep. The next day he called their neighbor and brought her to [facility]. She saw her PCP after, reporting leg swelling, LE dopplers showed DVTs, she was started on Xarelto. This was recently stopped and switched to aspirin. Her husband reports sleep is good. She denies any headaches, dizziness, vision changes, focal numbness/tingling. She is wheelchair-bound at home and needs helps with transfers.   HPI 07/07/15: This is a pleasant 84 yo RH woman with a history of hypertension, hyperlipidemia, breast cancer, NSTEMI, who presented for worsening memory. She states "I knows I have some issues with it," because she would have to ask her husband things every once in a while. Her husband started noticing memory changes a little over a year ago, but it has become more noticeable recently, where friends have started noticing as well. A few months ago, she was supposed to meet her friend at a certain location, but forgot their meeting place and went to a different place. She forgets that she makes appointments with her friends.  She continues to drive, but has called him at least 2-3 times that she was turned around and not sure how to get back. She does have a GPS in her car but did not know what address to put in. Her husband started putting her medications in a pillbox a couple of months ago to make sure she takes them, and they report she overall remembers her medications. She denies any missed bill payments except for 1 notice they got yesterday. She has occasional word-finding difficulties and problems multitasking. No personality changes. She started Aricept a few months ago, and initially reported soft stools, which she denies today. She denies any side effects on Aricept. She has occasional mild bilateral hand tremors. She denies any significant head injuries. Her father had memory problems.   Laboratory Data: 04/2015: CBC, CMP unremarkable. TSH 3.810, B12 356, vitamin D 42.8 I personally reviewed MRI brain without contrast which showed mild to moderate generalized atrophy and chronic microvascular disease, no acute changes.  PAST MEDICAL HISTORY: Past Medical History:  Diagnosis Date  . Arthritis   . Breast cancer ([place])   . Chronic diastolic CHF (congestive heart failure) ([place])   . Cognitive impairment   . Hx of echocardiogram    Echo (9/14):  Mild focal basal septal hypertrophy, EF 55-60%, normal wall motion, Gr 1 DD   . Hypercholesteremia   . Hypertension   . Hyponatremia   . Leukocytosis   . NSTEMI (non-ST elevated myocardial infarction) ([place])  in setting of low Na:  Echo 7/22:  EF 60-65%, normal wall motion, mild LVH, grade 1 diast dysfxn, PASP 29.;  Myoview:  EF 85% and no ischemia  . Personal history of  adenomatouscolonic polyps 04/22/2012   2 diminutive adenomas 12/2006 Carla Robles)  . Physical deconditioning   . Risk for falls   . SIADH (syndrome of inappropriate ADH production) ([place])   . Urinary retention     MEDICATIONS: Current Outpatient Medications on File Prior to Visit  Medication Sig  Dispense Refill  . aspirin 81 MG tablet Take 81 mg by mouth daily.      . Calcium Carbonate-Vitamin D (CALTRATE 600+D) 600-400 MG-UNIT per chew tablet Chew 1 tablet by mouth 2 (two) times daily.     . Cholecalciferol (VITAMIN D3) 1000 units CAPS Take by mouth.    . donepezil (ARICEPT) 10 MG tablet Take 1 tablet (10 mg total) by mouth at bedtime. 90 tablet 3  . fish oil-omega-3 fatty acids 1000 MG capsule Take 1 g by mouth daily.      Marland Kitchen lisinopril (PRINIVIL,ZESTRIL) 5 MG tablet TAKE 1 TABLET BY MOUTH EVERY DAY 90 tablet 0  . memantine (NAMENDA) 5 MG tablet Take 1 tablet twice a day 180 tablet 3  . Multiple Vitamin (MULTIVITAMIN) tablet Take 1 tablet by mouth daily.      . Rivaroxaban 15 & 20 MG TBPK Start with one 15mg  tablet by mouth twice a day with food x 21 d. On Day 22, switch to one 20mg  tablet once a day with food. 51 each 0   No current facility-administered medications on file prior to visit.     ALLERGIES: Allergies  Allergen Reactions  . Ace Inhibitors Other (See Comments)    Hyponatremia  . Statins Other (See Comments)    Muscle aches  Muscle aches  . Other     hmg coa redustase inhibitors - muscle aches    FAMILY HISTORY: Family History  Problem Relation Age of Onset  . Diabetes Father   . Stroke Father   . Hypertension Mother     SOCIAL HISTORY: Social History   Socioeconomic History  . Marital status: Married    Spouse name: Not on file  . Number of children: 3  . Years of education: Not on file  . Highest education level: Not on file  Occupational History  . Occupation: Retired  Scientific laboratory technician  . Financial resource strain: Not on file  . Food insecurity:    Worry: Not on file    Inability: Not on file  . Transportation needs:    Medical: Not on file    Non-medical: Not on file  Tobacco Use  . Smoking status: Former Smoker    Types: Cigarettes  . Smokeless tobacco: Never Used  . Tobacco comment: quit 1969  Substance and Sexual Activity  .  Alcohol use: Yes    Alcohol/week: 0.0 standard drinks    Comment: occ wine  . Drug use: No  . Sexual activity: Never  Lifestyle  . Physical activity:    Days per week: Not on file    Minutes per session: Not on file  . Stress: Not on file  Relationships  . Social connections:    Talks on phone: Not on file    Gets together: Not on file    Attends religious service: Not on file    Active member of club or organization: Not on file    Attends meetings of clubs or organizations:  Not on file    Relationship status: Not on file  . Intimate partner violence:    Fear of current or ex partner: Not on file    Emotionally abused: Not on file    Physically abused: Not on file    Forced sexual activity: Not on file  Other Topics Concern  . Not on file  Social History Narrative  . Not on file    REVIEW OF SYSTEMS: Constitutional: No fevers, chills, or sweats, no generalized fatigue, change in appetite Eyes: No visual changes, double vision, eye pain Ear, nose and throat: No hearing loss, ear pain, nasal congestion, sore throat Cardiovascular: No chest pain, palpitations Respiratory:  No shortness of breath at rest or with exertion, wheezes GastrointestinaI: No nausea, vomiting, diarrhea, abdominal pain, fecal incontinence Genitourinary:  No dysuria, urinary retention or frequency Musculoskeletal:  No neck pain,+ back pain Integumentary: No rash, pruritus, skin lesions Neurological: as above Psychiatric: No depression, insomnia, anxiety Endocrine: No palpitations, fatigue, diaphoresis, mood swings, change in appetite, change in weight, increased thirst Hematologic/Lymphatic:  No anemia, purpura, petechiae. Allergic/Immunologic: no itchy/runny eyes, nasal congestion, recent allergic reactions, rashes  PHYSICAL EXAM: Vitals:   08/22/18 1135  BP: 116/60  Pulse: 87  SpO2: 97%   General: No acute distress Head:  Normocephalic/atraumatic Neck: supple, no paraspinal tenderness, full  range of motion Heart:  Regular rate and rhythm Lungs:  Clear to auscultation bilaterally Back: No paraspinal tenderness Skin/Extremities: No rash, no edema Neurological Exam: alert and oriented to person, knows she is in a hospital, did not know city/state. No aphasia or dysarthria. Fund of knowledge is reduced.  Recent and remote memory are impaired. 0/3 delayed recall. Attention and concentration are reduced.  Able to name "pen," unable to name "button." Able to repeat phrases. CDT 0/5  MMSE - Mini Mental State Exam 08/22/2018 04/16/2017 10/12/2016  Orientation to time 0 1 3  Orientation to Place 2 4 5   Registration 3 3 3   Attention/ Calculation 2 5 5   Recall 0 0 0  Language- name 2 objects 1 2 2   Language- repeat 1 1 1   Language- follow 3 step command 2 3 3   Language- read & follow direction 1 1 1   Write a sentence 1 1 1   Copy design 0 1 1  Total score 13 22 25    Cranial nerves: Pupils equal, round, reactive to light.  Extraocular movements intact with no nystagmus. Visual fields full. Facial sensation intact. No facial asymmetry. Tongue, uvula, palate midline.  Motor: Bulk and tone normal, no cogwheeling, muscle strength 5/5 throughout with no pronator drift.  Sensation to light touch intact.  No extinction to double simultaneous stimulation.  Deep tendon reflexes +1 throughout, toes downgoing.  Finger to nose testing intact.  Gait note tested, wheelchairbound at home. No resting tremor today, +bilateral postural and endpoint tremor.  IMPRESSION: This is a pleasant 84 yo RH woman with a history of hypertension, hyperlipidemia, NSTEMI, breast cancer, with moderate dementia. MRI brain did not show any acute changes. MMSE today 13/30 (22/30 in May 2018). She is taking Aricept 10mg  daily and Namenda 5mg  BID with no side effects. She has 24/7 supervision and care, her husband would like to keep her at home as long as possible. Caregiver support provided. We again discussed the importance of  physical exercise and brain stimulation exercises for brain health. She will follow-up in 6 months, he knows to call for any changes.  Thank you for allowing me to participate in  her care.  Please do not hesitate to call for any questions or concerns.  The duration of this appointment visit was 30 minutes of face-to-face time with the patient.  Greater than 50% of this time was spent in counseling, explanation of diagnosis, planning of further management, and coordination of care.   Ellouise Newer, M.D.   CC: Dr. [place]

## 2018-08-22 NOTE — Patient Instructions (Signed)
1. Continue all your medications 2. Follow-up in 6 months or so, call for any changes  FALL PRECAUTIONS: Be cautious when walking. Scan the area for obstacles that may increase the risk of trips and falls. When getting up in the mornings, sit up at the edge of the bed for a few minutes before getting out of bed. Consider elevating the bed at the head end to avoid drop of blood pressure when getting up. Walk always in a well-lit room (use night lights in the walls). Avoid area rugs or power cords from appliances in the middle of the walkways. Use a walker or a cane if necessary and consider physical therapy for balance exercise. Get your eyesight checked regularly.  HOME SAFETY: Consider the safety of the kitchen when operating appliances like stoves, microwave oven, and blender. Consider having supervision and share cooking responsibilities until no longer able to participate in those. Accidents with firearms and other hazards in the house should be identified and addressed as well.  ABILITY TO BE LEFT ALONE: If patient is unable to contact 911 operator, consider using LifeLine, or when the need is there, arrange for someone to stay with patients. Smoking is a fire hazard, consider supervision or cessation. Risk of wandering should be assessed by caregiver and if detected at any point, supervision and safe proof recommendations should be instituted.  RECOMMENDATIONS FOR ALL PATIENTS WITH MEMORY PROBLEMS: 1. Continue to exercise (Recommend 30 minutes of walking everyday, or 3 hours every week) 2. Increase social interactions - continue going to [place] and enjoy social gatherings with friends and family 3. Eat healthy, avoid fried foods and eat more fruits and vegetables 4. Maintain adequate blood pressure, blood sugar, and blood cholesterol level. Reducing the risk of stroke and cardiovascular disease also helps promoting better memory. 5. Avoid stressful situations. Live a simple life and avoid  aggravations. Organize your time and prepare for the next day in anticipation. 6. Sleep well, avoid any interruptions of sleep and avoid any distractions in the bedroom that may interfere with adequate sleep quality 7. Avoid sugar, avoid sweets as there is a strong link between excessive sugar intake, diabetes, and cognitive impairment The Mediterranean diet has been shown to help patients reduce the risk of progressive memory disorders and reduces cardiovascular risk. This includes eating fish, eat fruits and green leafy vegetables, nuts like almonds and hazelnuts, walnuts, and also use olive oil. Avoid fast foods and fried foods as much as possible. Avoid sweets and sugar as sugar use has been linked to worsening of memory function.  There is always a concern of gradual progression of memory problems. If this is the case, then we may need to adjust level of care according to patient needs. Support, both to the patient and caregiver, should then be put into place.

## 2018-08-28 ENCOUNTER — Telehealth: Payer: Self-pay | Admitting: Family Medicine

## 2018-08-28 NOTE — Telephone Encounter (Signed)
Copied from [place] [id number]. Topic: Quick Communication - See Telephone Encounter >> Aug 28, 2018 10:30 AM Ahmed Prima L wrote: CRM for notification. See Telephone encounter for: 08/28/18.  Patient's husband, Carloyn Manner called and said she just finished her " xarelto " , he would like to know does he need to start her back on her baby aspirin, taking one a day. Carloyn Manner can be reached at [phone]

## 2018-08-28 NOTE — Telephone Encounter (Signed)
Message sent to Dr. Jordan for review. Please advise 

## 2018-09-01 ENCOUNTER — Ambulatory Visit: Payer: Self-pay | Admitting: *Deleted

## 2018-09-01 NOTE — Telephone Encounter (Signed)
Pt's husband calling to report that the pt has passed blood with and without stool since late yesterday. Pt's husband states that the blood was red in color. Pt's husband states that the pt denies any abdominal pain, nausea, dizziness or other symptoms at this time. Pt was previously on Xaleltro 2 weeks ago and prior to that was on aspirin which has not been resumed as of now. Pt scheduled for tomorrow and pt's husband advised that if symptoms become worse before scheduled appt to seek care in the ED or return call to the office.  Understanding verbalized.   Reason for Disposition . MODERATE rectal bleeding (small blood clots, passing blood without stool, or toilet water turns red)  Answer Assessment - Initial Assessment Questions 1. APPEARANCE of BLOOD: "What color is it?" "Is it passed separately, on the surface of the stool, or mixed in with the stool?"      Bright red blood happens with and without stool 2. AMOUNT: "How much blood was passed?"    Pt's husband stating that is was more than a stain and was alarming 3. FREQUENCY: "How many times has blood been passed with the stools?"      Occurred once today, unable to give a specfic number 4. ONSET: "When was the blood first seen in the stools?" (Days or weeks)      Since yesterday 5. DIARRHEA: "Is there also some diarrhea?" If so, ask: "How many diarrhea stools were passed in past 24 hours?"      No 6. CONSTIPATION: "Do you have constipation?" If so, "How bad is it?"     No 7. RECURRENT SYMPTOMS: "Have you had blood in your stools before?" If so, ask: "When was the last time?" and "What happened that time?"      Yes about a month  8. BLOOD THINNERS: "Do you take any blood thinners?" (e.g., Coumadin/warfarin, Pradaxa/dabigatran, aspirin)     Not since 2 weeks ago was on Xarelto and aspirin previously  9. OTHER SYMPTOMS: "Do you have any other symptoms?"  (e.g., abdominal pain, vomiting, dizziness, fever)    Dizziness this morning but denies  currently, pt's husband notes that pt's BP is 90/72 and usually runs 118-120/72 10. PREGNANCY: "Is there any chance you are pregnant?" "When was your last menstrual period?"       n/a  Protocols used: RECTAL BLEEDING-A-AH

## 2018-09-01 NOTE — Telephone Encounter (Signed)
She is supposed to complete 21 days of Xarelto 15 mg bid and continue Xarelto 20 mg daily until co,mpeting 3 months.  Thanks, BJ

## 2018-09-02 ENCOUNTER — Encounter: Payer: Self-pay | Admitting: Family Medicine

## 2018-09-02 ENCOUNTER — Ambulatory Visit: Payer: Medicare Other | Admitting: Family Medicine

## 2018-09-02 VITALS — BP 112/70 | HR 96 | Temp 97.8°F | Resp 12 | Ht 62.0 in

## 2018-09-02 DIAGNOSIS — I82402 Acute embolism and thrombosis of unspecified deep veins of left lower extremity: Secondary | ICD-10-CM | POA: Diagnosis not present

## 2018-09-02 DIAGNOSIS — Z23 Encounter for immunization: Secondary | ICD-10-CM

## 2018-09-02 DIAGNOSIS — R2681 Unsteadiness on feet: Secondary | ICD-10-CM

## 2018-09-02 DIAGNOSIS — F03B0 Unspecified dementia, moderate, without behavioral disturbance, psychotic disturbance, mood disturbance, and anxiety: Secondary | ICD-10-CM

## 2018-09-02 DIAGNOSIS — F0390 Unspecified dementia without behavioral disturbance: Secondary | ICD-10-CM | POA: Diagnosis not present

## 2018-09-02 NOTE — Telephone Encounter (Signed)
Discussed at [place] for 09/02/18.

## 2018-09-02 NOTE — Assessment & Plan Note (Signed)
Declining. No changes in current management. Continue following with Dr. Delice Lesch. Her husband agrees with palliative care evaluation, referral placed.

## 2018-09-02 NOTE — Assessment & Plan Note (Signed)
We discussed side effects of Xarelto as well as benefits. Because recurrent rectal blood her husband and I agree on discontinuing Xarelto.  He understands possible complications of DVT. Instructed about warning signs. Aspirin 325 mg daily for 2 months.  We also discussed side effects of Aspirin. Husband voices understanding.

## 2018-09-02 NOTE — Assessment & Plan Note (Signed)
She greatly benefit from PT but is still not ambulatory when physical therapist is not with her. Fall precautions discussed.

## 2018-09-02 NOTE — Progress Notes (Signed)
ACUTE VISIT   HPI:  Chief Complaint  Patient presents with  . Blood In Stools    [name] is a 84 y.o. female, who is here today with her husband, who is concerned about recurrent rectal bleeding. Currently she is being treated for acute DVT, completed Xarelto 15 mg twice daily and now on Xarelto 20 mg daily. Her husband provides information given her history of dementia. Negative for unusual headache, change in appetite, chest pain, dyspnea, abdominal pain, gross hematuria, or changes in mental status. Recently she was evaluated by Dr. Delice Lesch, according to husband she is declining.   For the past few days she has been having some constipation,  she sits in the toilet for 15 to 20 minutes.   Hard stool last week, when her husband was changing the diaper and wiping her, he palpate a "lump", noted blood on tissue and diaper.   She recently completed PT, which has helped greatly.  Her husband is having more difficulty with helping her with ADLs.  Frequently he has to ask for help to his neighbor or wait until his own can come to help her bathing her.    Review of Systems  Constitutional: Positive for activity change and fatigue. Negative for appetite change and fever.  HENT: Negative for mouth sores, nosebleeds and trouble swallowing.   Respiratory: Negative for cough, shortness of breath and wheezing.   Cardiovascular: Negative for chest pain, palpitations and leg swelling.  Gastrointestinal: Positive for anal bleeding and constipation. Negative for abdominal pain, blood in stool, nausea and vomiting.       Negative for changes in bowel habits.  Genitourinary: Negative for decreased urine volume, dysuria and hematuria.  Skin: Negative for rash and wound.  Neurological: Negative for syncope and headaches.  Psychiatric/Behavioral: Positive for confusion. Negative for hallucinations.      No current facility-administered medications on file prior to visit.     Current Outpatient Medications on File Prior to Visit  Medication Sig Dispense Refill  . Calcium Carbonate-Vitamin D (CALTRATE 600+D) 600-400 MG-UNIT per chew tablet Chew 1 tablet by mouth 2 (two) times daily.     . Cholecalciferol (VITAMIN D3) 1000 units CAPS Take by mouth.    . donepezil (ARICEPT) 10 MG tablet Take 1 tablet (10 mg total) by mouth at bedtime. 90 tablet 3  . fish oil-omega-3 fatty acids 1000 MG capsule Take 1 g by mouth daily.      Marland Kitchen lisinopril (PRINIVIL,ZESTRIL) 5 MG tablet TAKE 1 TABLET BY MOUTH EVERY DAY 90 tablet 0  . memantine (NAMENDA) 5 MG tablet Take 1 tablet twice a day 180 tablet 3  . Multiple Vitamin (MULTIVITAMIN) tablet Take 1 tablet by mouth daily.      Marland Kitchen aspirin 81 MG tablet Take 81 mg by mouth daily.         Past Medical History:  Diagnosis Date  . Arthritis   . Breast cancer ([place])   . Chronic diastolic CHF (congestive heart failure) ([place])   . Cognitive impairment   . Hx of echocardiogram    Echo (9/14):  Mild focal basal septal hypertrophy, EF 55-60%, normal wall motion, Gr 1 DD   . Hypercholesteremia   . Hypertension   . Hyponatremia   . Leukocytosis   . NSTEMI (non-ST elevated myocardial infarction) ([place])    in setting of low Na:  Echo 7/22:  EF 60-65%, normal wall motion, mild LVH, grade 1 diast dysfxn, PASP 29.;  Myoview:  EF 85% and no ischemia  . Personal history of  adenomatouscolonic polyps 04/22/2012   2 diminutive adenomas 12/2006 [name])  . Physical deconditioning   . Risk for falls   . SIADH (syndrome of inappropriate ADH production) ([place])   . Urinary retention    Allergies  Allergen Reactions  . Ace Inhibitors Other (See Comments)    Hyponatremia  . Statins Other (See Comments)    Muscle aches  Muscle aches  . Other     hmg coa redustase inhibitors - muscle aches    Social History   Socioeconomic History  . Marital status: Married    Spouse name: Not on file  . Number of children: 3  . Years of education: Not on  file  . Highest education level: Not on file  Occupational History  . Occupation: Retired  Scientific laboratory technician  . Financial resource strain: Not on file  . Food insecurity:    Worry: Not on file    Inability: Not on file  . Transportation needs:    Medical: Not on file    Non-medical: Not on file  Tobacco Use  . Smoking status: Former Smoker    Types: Cigarettes  . Smokeless tobacco: Never Used  . Tobacco comment: quit 1969  Substance and Sexual Activity  . Alcohol use: Yes    Alcohol/week: 0.0 standard drinks    Comment: occ wine  . Drug use: No  . Sexual activity: Never  Lifestyle  . Physical activity:    Days per week: Not on file    Minutes per session: Not on file  . Stress: Not on file  Relationships  . Social connections:    Talks on phone: Not on file    Gets together: Not on file    Attends religious service: Not on file    Active member of club or organization: Not on file    Attends meetings of clubs or organizations: Not on file    Relationship status: Not on file  Other Topics Concern  . Not on file  Social History Narrative  . Not on file    Vitals:   09/02/18 1125  BP: 112/70  Pulse: 96  Resp: 12  Temp: 97.8 F (36.6 C)  SpO2: 92%   Body mass index is 31.09 kg/m.   Physical Exam  Nursing note and vitals reviewed. Constitutional: She appears well-developed. She is cooperative. She does not appear ill. No distress.  HENT:  Head: Normocephalic and atraumatic.  Mouth/Throat: Oropharynx is clear and moist and mucous membranes are normal.  Eyes: Conjunctivae are normal.  Cardiovascular: Normal rate and regular rhythm.  No murmur heard. DP pulses present bilateral.  Respiratory: Effort normal and breath sounds normal. No respiratory distress.  GI: Soft. She exhibits no mass. There is no tenderness.  Genitourinary:  Genitourinary Comments: We could not do a rectal exam, we were not able to have her standing up or on examination table due to unstable  gait.   Musculoskeletal: She exhibits edema (1+ pitting LE edema,bilateral.). She exhibits no tenderness.  Neurological: She is alert. She has normal strength. She is disoriented. Gait abnormal.  She is in her wheelchair.  Skin: Skin is warm. No rash noted. No erythema.  Psychiatric: She has a normal mood and affect.  Fairly groomed, good eye contact.      ASSESSMENT AND PLAN:   Ms. Cayleen was seen today for blood in stools.  Orders Placed This Encounter  Procedures  . Flu vaccine  HIGH DOSE PF  . Ambulatory referral to Home Health     Moderate dementia, without behavioral disturbance Declining. No changes in current management. Continue following with Dr. Delice Lesch. Her husband agrees with palliative care evaluation, referral placed.  Acute deep vein thrombosis (DVT) of left lower extremity (HCC) We discussed side effects of Xarelto as well as benefits. Because recurrent rectal blood her husband and I agree on discontinuing Xarelto.  He understands possible complications of DVT. Instructed about warning signs. Aspirin 325 mg daily for 2 months.  We also discussed side effects of Aspirin. Husband voices understanding.  Unstable gait She greatly benefit from PT but is still not ambulatory when physical therapist is not with her. Fall precautions discussed.  Encounter for immunization -     Flu vaccine HIGH DOSE PF    40 min face to face OV. > 50% was dedicated to discussion of Dx, prognosis, treatment options, and some side effects of medications as well as coordination of care.  We discussed differences in between hospice and palliative care, I think she would benefit his from palliative care, husband agrees with plan.    Return if symptoms worsen or fail to improve.     Mykela Mewborn G. Martinique, MD  [facility]. [place] office.

## 2018-09-02 NOTE — Patient Instructions (Addendum)
A few things to remember from today's visit:   Unstable gait - Plan: Ambulatory referral to [facility]  Acute deep vein thrombosis (DVT) of left lower extremity, unspecified vein ([place]) - Plan: Ambulatory referral to Home Health  Moderate dementia, without behavioral disturbance ([place]) - Plan: Ambulatory referral to [facility] because rectal bleed Start Aspirin 325 mg daily and continue for 2 months. Monitor for mor bleeding.   Please be sure medication list is accurate. If a new problem present, please set up appointment sooner than planned today.

## 2018-09-05 ENCOUNTER — Emergency Department (HOSPITAL_COMMUNITY): Payer: Medicare Other

## 2018-09-05 ENCOUNTER — Emergency Department (HOSPITAL_COMMUNITY)
Admission: EM | Admit: 2018-09-05 | Discharge: 2018-10-03 | Disposition: E | Payer: Medicare Other | Attending: Emergency Medicine | Admitting: Emergency Medicine

## 2018-09-05 DIAGNOSIS — I252 Old myocardial infarction: Secondary | ICD-10-CM | POA: Diagnosis not present

## 2018-09-05 DIAGNOSIS — I110 Hypertensive heart disease with heart failure: Secondary | ICD-10-CM | POA: Diagnosis not present

## 2018-09-05 DIAGNOSIS — Z7982 Long term (current) use of aspirin: Secondary | ICD-10-CM | POA: Diagnosis not present

## 2018-09-05 DIAGNOSIS — Z87891 Personal history of nicotine dependence: Secondary | ICD-10-CM | POA: Diagnosis not present

## 2018-09-05 DIAGNOSIS — Z853 Personal history of malignant neoplasm of breast: Secondary | ICD-10-CM | POA: Diagnosis not present

## 2018-09-05 DIAGNOSIS — I5032 Chronic diastolic (congestive) heart failure: Secondary | ICD-10-CM | POA: Insufficient documentation

## 2018-09-05 DIAGNOSIS — I469 Cardiac arrest, cause unspecified: Secondary | ICD-10-CM | POA: Insufficient documentation

## 2018-09-05 DIAGNOSIS — E119 Type 2 diabetes mellitus without complications: Secondary | ICD-10-CM | POA: Diagnosis not present

## 2018-09-05 DIAGNOSIS — Z79899 Other long term (current) drug therapy: Secondary | ICD-10-CM | POA: Insufficient documentation

## 2018-09-05 LAB — I-STAT ARTERIAL BLOOD GAS, ED
ACID-BASE DEFICIT: 16 mmol/L — AB (ref 0.0–2.0)
Bicarbonate: 15.7 mmol/L — ABNORMAL LOW (ref 20.0–28.0)
O2 SAT: 82 %
TCO2: 18 mmol/L — AB (ref 22–32)
pCO2 arterial: 73.2 mmHg (ref 32.0–48.0)
pH, Arterial: 6.941 — CL (ref 7.350–7.450)
pO2, Arterial: 75 mmHg — ABNORMAL LOW (ref 83.0–108.0)

## 2018-09-05 LAB — I-STAT CHEM 8, ED
BUN: 41 mg/dL — ABNORMAL HIGH (ref 8–23)
CALCIUM ION: 1.12 mmol/L — AB (ref 1.15–1.40)
Chloride: 114 mmol/L — ABNORMAL HIGH (ref 98–111)
Creatinine, Ser: 1.7 mg/dL — ABNORMAL HIGH (ref 0.44–1.00)
Glucose, Bld: 313 mg/dL — ABNORMAL HIGH (ref 70–99)
HEMATOCRIT: 45 % (ref 36.0–46.0)
HEMOGLOBIN: 15.3 g/dL — AB (ref 12.0–15.0)
Potassium: 3.5 mmol/L (ref 3.5–5.1)
SODIUM: 146 mmol/L — AB (ref 135–145)
TCO2: 17 mmol/L — AB (ref 22–32)

## 2018-09-05 LAB — CBC WITH DIFFERENTIAL/PLATELET
BASOS ABS: 0.2 10*3/uL — AB (ref 0.0–0.1)
BASOS PCT: 1 %
EOS ABS: 0.4 10*3/uL (ref 0.0–0.7)
Eosinophils Relative: 2 %
HCT: 46.6 % — ABNORMAL HIGH (ref 36.0–46.0)
Hemoglobin: 13.1 g/dL (ref 12.0–15.0)
LYMPHS ABS: 8.4 10*3/uL — AB (ref 0.7–4.0)
Lymphocytes Relative: 38 %
MCH: 31.2 pg (ref 26.0–34.0)
MCHC: 28.1 g/dL — AB (ref 30.0–36.0)
MCV: 111 fL — ABNORMAL HIGH (ref 78.0–100.0)
MONO ABS: 1.1 10*3/uL — AB (ref 0.1–1.0)
Monocytes Relative: 5 %
NEUTROS ABS: 11.9 10*3/uL — AB (ref 1.7–7.7)
Neutrophils Relative %: 54 %
Platelets: 59 10*3/uL — ABNORMAL LOW (ref 150–400)
RBC: 4.2 MIL/uL (ref 3.87–5.11)
RDW: 14.9 % (ref 11.5–15.5)
WBC: 22 10*3/uL — ABNORMAL HIGH (ref 4.0–10.5)

## 2018-09-05 LAB — PROTIME-INR
INR: 3.05
Prothrombin Time: 31.3 seconds — ABNORMAL HIGH (ref 11.4–15.2)

## 2018-09-05 LAB — I-STAT CG4 LACTIC ACID, ED: Lactic Acid, Venous: 12.32 mmol/L (ref 0.5–1.9)

## 2018-09-05 LAB — I-STAT TROPONIN, ED: Troponin i, poc: 0.04 ng/mL (ref 0.00–0.08)

## 2018-09-05 MED ORDER — EPINEPHRINE PF 1 MG/10ML IJ SOSY
PREFILLED_SYRINGE | INTRAMUSCULAR | Status: AC | PRN
  Administered 2018-09-05: 1 via INTRAVENOUS

## 2018-09-05 MED ORDER — SODIUM CHLORIDE 0.9 % IV BOLUS
1000.0000 mL | Freq: Once | INTRAVENOUS | Status: AC
  Administered 2018-09-05: 1000 mL via INTRAVENOUS

## 2018-09-05 MED ORDER — EPINEPHRINE PF 1 MG/10ML IJ SOSY
PREFILLED_SYRINGE | INTRAMUSCULAR | Status: AC
  Filled 2018-09-05: qty 10

## 2018-09-05 MED ORDER — SODIUM BICARBONATE 8.4 % IV SOLN
INTRAVENOUS | Status: AC | PRN
  Administered 2018-09-05: 50 meq via INTRAVENOUS

## 2018-09-05 MED ORDER — EPINEPHRINE PF 1 MG/ML IJ SOLN
0.5000 ug/min | INTRAVENOUS | Status: DC
  Administered 2018-09-05: 0.5 ug/min via INTRAVENOUS
  Filled 2018-09-05: qty 4

## 2018-09-05 MED ORDER — NOREPINEPHRINE 4 MG/250ML-% IV SOLN
INTRAVENOUS | Status: AC
  Filled 2018-09-05: qty 250

## 2018-09-05 MED ORDER — NOREPINEPHRINE 4 MG/250ML-% IV SOLN
0.0000 ug/min | Freq: Once | INTRAVENOUS | Status: AC
  Administered 2018-09-05: 40 ug/min via INTRAVENOUS

## 2018-09-08 LAB — PATHOLOGIST SMEAR REVIEW

## 2018-09-08 MED FILL — Medication: Qty: 1 | Status: AC

## 2018-09-25 ENCOUNTER — Other Ambulatory Visit: Payer: Self-pay | Admitting: Family Medicine

## 2018-10-03 NOTE — Code Documentation (Signed)
Family updated as to patient's status.

## 2018-10-03 NOTE — Progress Notes (Signed)
   09/22/18 1100  Clinical Encounter Type  Visited With Patient and family together  Visit Type Death  Spiritual Encounters  Spiritual Needs Grief support  Responded to family in Consult room waiting for doctor. Doctor informed patient's husband that his wife was critically ill. Provided comfort care. Help husband contacted his son and he informed him as to mother's condition. Doctor returned to give notice that wife just passed. Provided grief and spiritual support. Gave husband the patient placement card to call for funeral home information. Escorted husband to his car to ensure he was ok to drive home. Husband information is in the wife's facesheet for contact information.

## 2018-10-03 NOTE — ED Notes (Signed)
Paged pharmacy

## 2018-10-03 NOTE — Code Documentation (Signed)
Pt no pulse with SR, PVCs, epi ordered and given, CPR resumed.

## 2018-10-03 NOTE — Progress Notes (Signed)
Patient received in ED during CPR with an king airway. Post CPR assisted in replacement of King airway with 7.5 ET tube. Placed patient on vent at charted settings. ABG obtained and results given to MD.

## 2018-10-03 NOTE — ED Notes (Signed)
[facility] notified of time of death. Ref # [id number]. Not eligible for organ procurement. [employer].

## 2018-10-03 NOTE — ED Notes (Signed)
Dr. Francia Greaves notified of abnormal CG4

## 2018-10-03 NOTE — ED Triage Notes (Signed)
Pt arrives via EMS from home called out for resp distress, pt cool, diaphoretic, bp 60spalp, lost pulses with transfer to stretcher. Per EMS CPR started immeadiately, ROSC several times then AFib, RVR, cardioverted. 6 epis, 2.5 versed for biting at ETT. cbg 243. ? Leg infection prior to arrest. Pt has 20g L hand, IO left tibia. King airway inplace. PT unresponsive on arrival with SR, freq PVCs, lucas ongoing.

## 2018-10-03 NOTE — ED Notes (Signed)
Pts husband at bedside. Emotional support provided. Pts HR brady at this time. Decision made to hold any further interventions due to medical futility.

## 2018-10-03 NOTE — Code Documentation (Signed)
Patient time of death occurred at 27

## 2018-10-03 NOTE — Progress Notes (Signed)
   08-Sep-2018 0900  Clinical Encounter Type  Visited With Patient  Visit Type Code  While in ED patient in Trauma A CPR in progress. EMT indicated that spouse is coming. Reached out to spouse and son via cell no answer. Supported staff will follow-up as needed.

## 2018-10-03 NOTE — ED Provider Notes (Signed)
[facility] EMERGENCY DEPARTMENT Provider Note   CSN: [id number] Arrival date & time:        History   Chief Complaint Chief Complaint  Patient presents with  . Cardiac Arrest    HPI Carla Robles is a 84 y.o. female.  84 year old female with prior medical history as detailed below presents post cardiac arrest.  EMS reports that the patient experienced acute onset shortness of breath just prior to their arrival.  Patient went into PEA arrest as there transported the patient to the ambulance.  They performed CPR for approximately 30 minutes.  A King tube is in place.  The patient is gotten proximally 6 doses of epi.  Upon arrival the patient is being ventilated via a King tube.  Her skin is mottled and cool.  She does have a pulse.  Her blood pressure is low with a systolic in the 27P.  The history is provided by the patient, medical records, the EMS personnel and a relative.  Cardiac Arrest  Witnessed by:  Family member Incident location:  Home Time since incident:  45 minutes Time before BLS initiated:  Immediate Time before ALS initiated:  Immediate Condition upon EMS arrival:  Agonal respirations and unresponsive Pulse:  Absent Initial cardiac rhythm per EMS:  PEA Treatments prior to arrival:  ACLS protocol Medications given prior to ED:  Epinephrine Airway:  Intubation prior to arrival Rhythm on admission to ED:  Normal sinus Associated symptoms: difficulty breathing     Past Medical History:  Diagnosis Date  . Arthritis   . Breast cancer ([place])   . Chronic diastolic CHF (congestive heart failure) ([place])   . Cognitive impairment   . Hx of echocardiogram    Echo (9/14):  Mild focal basal septal hypertrophy, EF 55-60%, normal wall motion, Gr 1 DD   . Hypercholesteremia   . Hypertension   . Hyponatremia   . Leukocytosis   . NSTEMI (non-ST elevated myocardial infarction) ([place])    in setting of low Na:  Echo 7/22:  EF 60-65%, normal wall motion, mild  LVH, grade 1 diast dysfxn, PASP 29.;  Myoview:  EF 85% and no ischemia  . Personal history of  adenomatouscolonic polyps 04/22/2012   2 diminutive adenomas 12/2006 [name])  . Physical deconditioning   . Risk for falls   . SIADH (syndrome of inappropriate ADH production) ([place])   . Urinary retention     Patient Active Problem List   Diagnosis Date Noted  . Acute deep vein thrombosis (DVT) of left lower extremity ([place]) 07/23/2018  . On anticoagulant therapy 07/23/2018  . Diabetes mellitus, type II ([place]) 07/15/2018  . Unstable gait 06/13/2018  . LBBB (left bundle branch block) 01/01/2018  . Right hip pain 07/18/2017  . Moderate dementia, without behavioral disturbance ([place]) 10/16/2016  . Urinary tract infection, site not specified 05/05/2016  . Pulmonary vascular congestion 05/05/2016  . SIADH (syndrome of inappropriate ADH production) ([place]) 05/05/2016  . Hypercholesteremia 05/05/2016  . Hyponatremia 05/04/2016  . Mild cognitive impairment 07/07/2015  . Personal history of  adenomatouscolonic polyps 04/22/2012  . Cardiac enzymes elevated 09/07/2011  . Essential hypertension 07/13/2011  . Hyperlipidemia 07/13/2011    Past Surgical History:  Procedure Laterality Date  . BREAST LUMPECTOMY     breast biopsy  . CATARACT EXTRACTION W/ INTRAOCULAR LENS  IMPLANT, BILATERAL  2011  . COLONOSCOPY       OB History   None      Home Medications  Prior to Admission medications   Medication Sig Start Date End Date Taking? Authorizing Provider  aspirin 81 MG tablet Take 81 mg by mouth daily.      [provider]  Calcium Carbonate-Vitamin D (CALTRATE 600+D) 600-400 MG-UNIT per chew tablet Chew 1 tablet by mouth 2 (two) times daily.     [provider]  Cholecalciferol (VITAMIN D3) 1000 units CAPS Take by mouth.    [provider]  donepezil (ARICEPT) 10 MG tablet Take 1 tablet (10 mg total) by mouth at bedtime. 08/22/18   Cameron Sprang, MD  fish  oil-omega-3 fatty acids 1000 MG capsule Take 1 g by mouth daily.      [provider]  lisinopril (PRINIVIL,ZESTRIL) 5 MG tablet TAKE 1 TABLET BY MOUTH EVERY DAY 07/16/18   Martinique, Betty G, MD  memantine [facility]) 5 MG tablet Take 1 tablet twice a day 08/22/18   Cameron Sprang, MD  Multiple Vitamin (MULTIVITAMIN) tablet Take 1 tablet by mouth daily.      [provider]    Family History Family History  Problem Relation Age of Onset  . Diabetes Father   . Stroke Father   . Hypertension Mother     Social History Social History   Tobacco Use  . Smoking status: Former Smoker    Types: Cigarettes  . Smokeless tobacco: Never Used  . Tobacco comment: quit 1969  Substance Use Topics  . Alcohol use: Yes    Alcohol/week: 0.0 standard drinks    Comment: occ wine  . Drug use: No     Allergies   Ace inhibitors; Statins; and Other   Review of Systems Review of Systems  Unable to perform ROS: Acuity of condition  All other systems reviewed and are negative.    Physical Exam Updated Vital Signs BP (!) 60/36   Pulse (!) 35   Temp (!) 96.8 F (36 C)   Resp (!) 9   SpO2 (!) 0%   Physical Exam  Constitutional:  Patient is an extremis.  She is being ventilated via a King tube.  She is hypotensive with cool mottled skin.  HENT:  Head: Normocephalic and atraumatic.  Eyes:  Fixed and dilated bilaterally   Neck: Neck supple.  Cardiovascular: Normal rate and regular rhythm.  Pulmonary/Chest:  King tube in place - good ventilations via King   Abdominal: Soft.  Musculoskeletal:  No voluntary movement of 4 extremities   Skin:  Cool, mottled      ED Treatments / Results  Labs (all labs ordered are listed, but only abnormal results are displayed) Labs Reviewed  CBC WITH DIFFERENTIAL/PLATELET - Abnormal; Notable for the following components:      Result Value   WBC 22.0 (*)    HCT 46.6 (*)    MCV 111.0 (*)    MCHC 28.1 (*)    All other components  within normal limits  PROTIME-INR - Abnormal; Notable for the following components:   Prothrombin Time 31.3 (*)    All other components within normal limits  I-STAT CHEM 8, ED - Abnormal; Notable for the following components:   Sodium 146 (*)    Chloride 114 (*)    BUN 41 (*)    Creatinine, Ser 1.70 (*)    Glucose, Bld 313 (*)    Calcium, Ion 1.12 (*)    TCO2 17 (*)    Hemoglobin 15.3 (*)    All other components within normal limits  I-STAT CG4 LACTIC ACID,  ED - Abnormal; Notable for the following components:   Lactic Acid, Venous 12.32 (*)    All other components within normal limits  I-STAT ARTERIAL BLOOD GAS, ED - Abnormal; Notable for the following components:   pH, Arterial 6.941 (*)    pCO2 arterial 73.2 (*)    pO2, Arterial 75.0 (*)    Bicarbonate 15.7 (*)    TCO2 18 (*)    Acid-base deficit 16.0 (*)    All other components within normal limits  COMPREHENSIVE METABOLIC PANEL  I-STAT TROPONIN, ED    EKG EKG Interpretation  Date/Time:  2018-09-13 10:01:36 EDT Ventricular Rate:  126 PR Interval:    QRS Duration: 129 QT Interval:  376 QTC Calculation: 545 R Axis:   114 Text Interpretation:  Sinus tachycardia Borderline prolonged PR interval LAE, consider biatrial enlargement Nonspecific intraventricular conduction delay Borderline ST depression, diffuse leads Confirmed by Dene Gentry [phone]) on 09/13/2018 10:22:56 AM   Radiology Dg Chest Port 1 View  Result Date: 09-13-2018 CLINICAL DATA:  Post intubation EXAM: PORTABLE CHEST 1 VIEW COMPARISON:  11/22/2017 FINDINGS: Endotracheal tube is in the proximal right mainstem bronchus and could be retracted approximately 3 cm. NG tube enters the stomach. Low lung volumes, bibasilar atelectasis and mild cardiomegaly. No effusions or pneumothorax. IMPRESSION: Right mainstem intubation. Recommend retracting the endotracheal tube approximately 3 cm. Low lung volumes with bibasilar atelectasis. These results were  called by telephone at the time of interpretation on 09/13/2018 at 10:03 am to Dr. Dene Gentry , who verbally acknowledged these results. Electronically Signed   By: Rolm Baptise M.D.   On: 09-13-18 10:03    Procedures Date/Time: Sep 13, 2018 10:52 AM Performed by: Valarie Merino, MD Pre-anesthesia Checklist: Patient identified, Suction available, Patient being monitored and Timeout performed Oxygen Delivery Method: Non-rebreather mask Preoxygenation: Pre-oxygenation with 100% oxygen Tube size: 7.5 mm Number of attempts: 1 Airway Equipment and Method: Patient positioned with wedge pillow,  Video-laryngoscopy and Stylet Placement Confirmation: ETT inserted through vocal cords under direct vision,  Positive ETCO2,  CO2 detector and Breath sounds checked- equal and bilateral Tube secured with: Tape Difficulty Due To: Difficult Airway- due to anterior larynx Comments: Edison Pace removed - ET placed     .Central Line Date/Time: 09/13/18 10:56 AM Performed by: Valarie Merino, MD Authorized by: Valarie Merino, MD   Consent:    Consent obtained:  Emergent situation Pre-procedure details:    Skin preparation:  ChloraPrep   Skin preparation agent: Skin preparation agent completely dried prior to procedure   Procedure details:    Location:  R femoral   Patient position:  Flat   Procedural supplies:  Triple lumen   Catheter size:  7.5 Fr   Landmarks identified: yes     Ultrasound guidance: no     Number of attempts:  2   Successful placement: yes   Post-procedure details:    Post-procedure:  Dressing applied and line sutured   Assessment:  Blood return through all ports and free fluid flow   Patient tolerance of procedure:  Tolerated well, no immediate complications   (including critical care time) CRITICAL CARE Performed by: Valarie Merino   Total critical care time: 45 minutes  Critical care time was exclusive of separately billable procedures and treating other  patients.  Critical care was necessary to treat or prevent imminent or life-threatening deterioration.  Critical care was time spent personally by me on the following activities: development of treatment plan with patient and/or  surrogate as well as nursing, discussions with consultants, evaluation of patient's response to treatment, examination of patient, obtaining history from patient or surrogate, ordering and performing treatments and interventions, ordering and review of laboratory studies, ordering and review of radiographic studies, pulse oximetry and re-evaluation of patient's condition.   Medications Ordered in ED Medications  EPINEPHrine (ADRENALIN) 1 MG/10ML injection (has no administration in time range)  EPINEPHrine (ADRENALIN) 1 MG/10ML injection (1 Syringe Intravenous Given 09/15/18 0955)  sodium bicarbonate injection (50 mEq Intravenous Given 15-Sep-2018 0957)  EPINEPHrine (ADRENALIN) 4 mg in dextrose 5 % 250 mL (0.016 mg/mL) infusion (0 mcg/min Intravenous Stopped 2018-09-15 1035)  EPINEPHrine (ADRENALIN) 1 MG/10ML injection (1 Syringe Intravenous Given 09-15-18 0906)  EPINEPHrine (ADRENALIN) 1 MG/10ML injection (1 Syringe Intravenous Given 09-15-2018 0910)  sodium chloride 0.9 % bolus 1,000 mL (0 mLs Intravenous Stopped 2018-09-15 0938)  norepinephrine (LEVOPHED) 4mg  in D5W 253mL premix infusion (0 mcg/min Intravenous Stopped 09-15-2018 1035)  sodium chloride 0.9 % bolus 1,000 mL (0 mLs Intravenous Stopped 2018/09/15 1009)  sodium chloride 0.9 % bolus 1,000 mL (0 mLs Intravenous Stopped September 15, 2018 1040)     Initial Impression / Assessment and Plan / ED Course  I have reviewed the triage vital signs and the nursing notes.  Pertinent labs & imaging results that were available during my care of the patient were reviewed by me and considered in my medical decision making (see chart for details).     MDM  Screen complete  Patient is presenting following cardiac arrest.  Patient remained  unstable despite aggressive attempts at resuscitation.  Case was discussed with critical care services.  They feel that the patient's case is medically futile.   Husband of patient is aware of the case.  After extensive discussion with the husband the decision was made to make the patient comfort care only.  Shortly after this discussion the patient became bradycardic and then lost her pulse.  Time of death is at 1035AM.  The patient's primary Dr. Betty Martinique is aware of the case and will sign the death certificate.  Final Clinical Impressions(s) / ED Diagnoses   Final diagnoses:  Cardiac arrest [facility])    ED Discharge Orders    None       Valarie Merino, MD 09/15/18 1057

## 2018-10-03 DEATH — deceased

## 2018-11-03 ENCOUNTER — Ambulatory Visit: Payer: Medicare Other | Admitting: Neurology

## 2018-11-03 ENCOUNTER — Encounter

## 2019-04-10 ENCOUNTER — Ambulatory Visit: Payer: Medicare Other | Admitting: Neurology
